# Patient Record
Sex: Female | Born: 1940
Health system: Southern US, Community
[De-identification: ages and names within clinical notes are randomized; demographics above are authoritative.]

## PROBLEM LIST (undated history)

## (undated) DIAGNOSIS — E7800 Pure hypercholesterolemia, unspecified: Secondary | ICD-10-CM

## (undated) DIAGNOSIS — F32A Depression, unspecified: Secondary | ICD-10-CM

## (undated) DIAGNOSIS — F329 Major depressive disorder, single episode, unspecified: Secondary | ICD-10-CM

## (undated) DIAGNOSIS — K219 Gastro-esophageal reflux disease without esophagitis: Secondary | ICD-10-CM

## (undated) DIAGNOSIS — I10 Essential (primary) hypertension: Secondary | ICD-10-CM

## (undated) DIAGNOSIS — F419 Anxiety disorder, unspecified: Secondary | ICD-10-CM

## (undated) HISTORY — PX: TONSILLECTOMY: SUR1361

## (undated) HISTORY — PX: HEMORRHOID SURGERY: SHX153

## (undated) HISTORY — DX: Anxiety disorder, unspecified: F41.9

## (undated) HISTORY — DX: Depression, unspecified: F32.A

## (undated) HISTORY — PX: BREAST REDUCTION SURGERY: SHX8

## (undated) HISTORY — PX: STOMACH SURGERY: SHX791

## (undated) HISTORY — DX: Major depressive disorder, single episode, unspecified: F32.9

## (undated) HISTORY — PX: OTHER SURGICAL HISTORY: SHX169

## (undated) HISTORY — DX: Gastro-esophageal reflux disease without esophagitis: K21.9

---

## 2001-11-28 ENCOUNTER — Ambulatory Visit (HOSPITAL_COMMUNITY): Admission: RE | Admit: 2001-11-28 | Discharge: 2001-11-28 | Payer: Self-pay | Admitting: Pulmonary Disease

## 2002-07-15 ENCOUNTER — Ambulatory Visit (HOSPITAL_COMMUNITY): Admission: RE | Admit: 2002-07-15 | Discharge: 2002-07-15 | Payer: Self-pay | Admitting: Pulmonary Disease

## 2003-07-21 ENCOUNTER — Emergency Department (HOSPITAL_COMMUNITY): Admission: EM | Admit: 2003-07-21 | Discharge: 2003-07-21 | Payer: Self-pay | Admitting: Emergency Medicine

## 2005-01-18 ENCOUNTER — Ambulatory Visit (HOSPITAL_COMMUNITY): Admission: RE | Admit: 2005-01-18 | Discharge: 2005-01-18 | Payer: Self-pay | Admitting: Pulmonary Disease

## 2005-07-21 ENCOUNTER — Ambulatory Visit (HOSPITAL_COMMUNITY): Admission: RE | Admit: 2005-07-21 | Discharge: 2005-07-21 | Payer: Self-pay | Admitting: Pulmonary Disease

## 2005-07-24 ENCOUNTER — Ambulatory Visit (HOSPITAL_COMMUNITY): Admission: RE | Admit: 2005-07-24 | Discharge: 2005-07-24 | Payer: Self-pay | Admitting: Pulmonary Disease

## 2005-08-20 HISTORY — PX: COLONOSCOPY: SHX174

## 2005-08-31 ENCOUNTER — Ambulatory Visit: Payer: Self-pay | Admitting: Internal Medicine

## 2005-09-08 ENCOUNTER — Ambulatory Visit (HOSPITAL_COMMUNITY): Admission: RE | Admit: 2005-09-08 | Discharge: 2005-09-08 | Payer: Self-pay | Admitting: Internal Medicine

## 2005-09-08 ENCOUNTER — Encounter (INDEPENDENT_AMBULATORY_CARE_PROVIDER_SITE_OTHER): Payer: Self-pay | Admitting: Specialist

## 2005-09-08 ENCOUNTER — Ambulatory Visit: Payer: Self-pay | Admitting: Internal Medicine

## 2005-10-17 ENCOUNTER — Ambulatory Visit (HOSPITAL_COMMUNITY): Admission: RE | Admit: 2005-10-17 | Discharge: 2005-10-17 | Payer: Self-pay | Admitting: Pulmonary Disease

## 2006-07-27 ENCOUNTER — Ambulatory Visit (HOSPITAL_COMMUNITY): Admission: RE | Admit: 2006-07-27 | Discharge: 2006-07-27 | Payer: Self-pay | Admitting: Pulmonary Disease

## 2006-10-08 ENCOUNTER — Ambulatory Visit (HOSPITAL_COMMUNITY): Admission: RE | Admit: 2006-10-08 | Discharge: 2006-10-08 | Payer: Self-pay | Admitting: Pulmonary Disease

## 2007-08-12 ENCOUNTER — Ambulatory Visit (HOSPITAL_COMMUNITY): Admission: RE | Admit: 2007-08-12 | Discharge: 2007-08-12 | Payer: Self-pay | Admitting: Pulmonary Disease

## 2007-10-11 ENCOUNTER — Ambulatory Visit (HOSPITAL_COMMUNITY): Admission: RE | Admit: 2007-10-11 | Discharge: 2007-10-11 | Payer: Self-pay | Admitting: Pulmonary Disease

## 2008-08-21 ENCOUNTER — Ambulatory Visit (HOSPITAL_COMMUNITY): Admission: RE | Admit: 2008-08-21 | Discharge: 2008-08-21 | Payer: Self-pay | Admitting: Pulmonary Disease

## 2009-03-22 ENCOUNTER — Ambulatory Visit (HOSPITAL_COMMUNITY): Admission: RE | Admit: 2009-03-22 | Discharge: 2009-03-22 | Payer: Self-pay | Admitting: Pulmonary Disease

## 2009-04-20 ENCOUNTER — Ambulatory Visit (HOSPITAL_COMMUNITY): Admission: RE | Admit: 2009-04-20 | Discharge: 2009-04-20 | Payer: Self-pay | Admitting: Pulmonary Disease

## 2010-01-10 ENCOUNTER — Ambulatory Visit (HOSPITAL_COMMUNITY): Admission: RE | Admit: 2010-01-10 | Discharge: 2010-01-10 | Payer: Self-pay | Admitting: Pulmonary Disease

## 2010-01-17 ENCOUNTER — Ambulatory Visit (HOSPITAL_COMMUNITY): Admission: RE | Admit: 2010-01-17 | Discharge: 2010-01-17 | Payer: Self-pay | Admitting: Pulmonary Disease

## 2010-03-13 ENCOUNTER — Encounter (INDEPENDENT_AMBULATORY_CARE_PROVIDER_SITE_OTHER): Payer: Self-pay | Admitting: Internal Medicine

## 2010-07-08 NOTE — Op Note (Signed)
NAME:  [name] [name]                ACCOUNT NO.:  [id number]   MEDICAL RECORD NO.:  [id number]          PATIENT TYPE:  AMB   LOCATION:  DAY                           FACILITY:  [facility]   PHYSICIAN:  Lionel December, M.D.    DATE OF BIRTH:  1940/05/03   DATE OF PROCEDURE:  09/08/2005  DATE OF DISCHARGE:                                 OPERATIVE REPORT   PROCEDURE:  Colonoscopy.   ENDOSCOPIST:  Lionel December, M.D.   INDICATIONS:  Patient is a 70 year old Caucasian female with  chronic/intermittent hematochezia.  She recently had a CT which showed  thickening to the sigmoid colon. The study also showed 2 small hepatic  lesions; 1 in the left lobe, and another 1 in the right lobe.  I have  reviewed the imaging studies and these lesions are stable compared to prior  study of May 2005; and, therefore, do not need to be followed.  The  procedure and risks were reviewed with the patient and informed consent was  obtained.   MEDICATIONS FOR CONSCIOUS SEDATION:  Demerol 50 mg IV and Versed 8 mg IV.   FINDINGS:  Procedure performed in endoscopy suite.  The patient's vital  signs and O2 saturation were monitored during the procedure and remained  stable.  The patient was placed in the left lateral recumbent position and  rectal examination performed.  No abnormality noted on external or digital  exam.   Olympus videoscope was placed in the rectum and advanced, under vision, into  the sigmoid colon and beyond.  There were a cluster of a few small polyps in  the distal sigmoid colon which were dealt with on the way out.  The  preparation was satisfactory.  The scope was advanced to the cecum which was  identified by ileocecal valve and appendiceal orifice.  Pictures taken for  the record.  As the scope was withdrawn, colonic mucosa was carefully  examined.  There was a single diverticulum at the sigmoid colon. There was a  cluster of 4 or 5 polyps in the distal sigmoid colon.  Two of these had a  hemorrhagic surface.  These were very small.  These are ablated by a cold  biopsy.  Mucosa of the rest of the colon and rectum was normal.  The scope  was retroflexed to examine anorectal junction and small hemorrhoids are  noted below the dentate line.   Endoscope was straightened and withdrawn.  The patient tolerated the  procedure well.   FINAL DIAGNOSES:  1.  Examination performed to cecum.  2.  A single tiny diverticulum at he sigmoid colon.  3.  A cluster of small polyps at the distal sigmoid colon ablated by a cold      biopsy, suspicious for hyperplastic polyps.  4.  External hemorrhoids.   RECOMMENDATIONS:  1.  High fiber diet.  2.  She will continue dicyclomine at 10 mg b.i.d.  3.  I will be contacting the patient with the biopsy results; and further      recommendations.      Lionel December, M.D.  Electronically  Signed     NR/MEDQ  D:  09/08/2005  T:  09/08/2005  Job:  [id number]   cc:   Ramon Dredge L. Juanetta Gosling, M.D.  Fax: [phone]

## 2010-07-08 NOTE — Consult Note (Signed)
NAME:  [name] [name], [name] [name]                ACCOUNT NO.:  [id number]   MEDICAL RECORD NO.:  [id number]           PATIENT TYPE:  AMB   LOCATION:                                FACILITY:  [facility]   PHYSICIAN:  Lionel December, M.D.    DATE OF BIRTH:  14-Jun-1940   DATE OF CONSULTATION:  08/31/2005  DATE OF DISCHARGE:                                   CONSULTATION   REASON FOR CONSULTATION:  Hematochezia and abnormal abdominal CT in  reference to liver and sigmoid colon.   HISTORY OF PRESENT ILLNESS:  [name] [name] is a 70 year old Caucasian female who is  referred through the courtesy of Dr. Juanetta Gosling for GI evaluation.  She gives a  several month history of intermittent hematochezia with her bowel movements.  Sometimes blood just drips.  She has use suppositories which have helped.  She also complains of intermittent pain across her abdomen, associated with  lightheadedness and presyncope.  She recalls that the first episode occurred  in [place] when she actually passed out.  She remembers that she became  lightheaded, nauseated, cold and clammy before this happened, but she has  never had syncope since then, although she has had some close calls.  She  says that she has had fairly extensive workup when she was living in [place], but it has all been negative.  She denies melena or frank  bleeding.  She states her bowels move three to four times a day, usually in  the morning; and most of her stools are soft.  She denies nausea or  vomiting.  When she was working.  She was not eating the right foods, and  she would get heartburn frequently; but now that she has changed her eating  habits, she rarely experiences it.  She denies dysphagia.  She is  complaining of pain in her right flank.  That was the reason that she had a  CT which was negative for kidney stones.  She had 2 small lesions, and the  liver felt to be consistent with hemangiomas.  One measured 10 x 10 mm in  the lateral segment of left lobe and  the other one measured 10 x 8 mm in the  right lobe.  These were also seen preceding exam of 2005.  Dr. Tyron Russell who  read this study felt that these were stable.  She also had incompletely  distended sigmoid colon with somewhat prominent wall; and the question of  slight pericolic infiltrative changes with increased vascularity to the  sigmoid mesocolon.  There was no fluid collection noted.   The patient also had blood work by Dr. Juanetta Gosling on 07/20/2005.  Her WBC 6.8,  H&H 13.2 and 40.6, platelet count 274,000.  Comprehensive chemistry panel  was normal, bilirubin 0.5, AP 59, AST 21, ALT 13, albumin 4.2.  TSH was  1.007.   MEDICATIONS:  1.  She is on Actonel 35 mg every week.  2.  Restoril 30 mg q.h.s. p.r.n.  3.  Lasix 40 mg daily.  4.  Ativan 1 mg b.i.d.  5.  Atenolol 50 mg daily.  6.  Fish oil capsule one daily.  7.  B12 one daily.  8.  B complex one daily.  9.  Calcium and vitamin D 600 mg b.i.d.  10. Sinemet 2 capsules daily.  11. Gastric capsules daily.  12. Garlic capsules 2 daily.   PAST MEDICAL HISTORY:  She has been hypertensive for several years.  She has  had problems with anxiety.  She has GERD and knee arthritis.  She was  diagnosed with osteoporosis this year.   PRIOR SURGERIES INCLUDE:  1.  Hemorrhoidectomy in 1969.  2.  She had tumor removed from her stomach in 1976 when she was living in      [place].  All she knows is that it was a benign tumor.  3.  She had submandibular glands or lymph nodes removed when she was a      child.  Apparently she had an infection in this area.  4.  She had breast reduction in 1993.  5.  She had a C-section in 1975.   ALLERGIES:  To CODEINE causing syncope.   FAMILY HISTORY:  Noncontributory.  She believes father died of TB and her  mother had heart disease and died in her 30s.  She had one sister and 44  brothers all of them are deceased except one brother.  Most of the brothers  died during the [place] War.   SOCIAL  HISTORY:  She is married (second marriage).  She has two children.  Her son has gout and obesity.  She worked at [employer] for 12  years.  She is originally from [place].  She came in 1967 and went back for a  few years, but now here permanently.  She has never smoked cigarettes and  does not drink alcohol.   PHYSICAL EXAM:  GENERAL:  A pleasant well-developed, well-nourished  Caucasian female who is in no acute distress.  VITAL SIGNS:  She weighs 153 pounds.  She is 4 feet 11 inches tall.  Pulse  64, blood pressure 128/80, temperature is 98.  HEENT:  Conjunctivae is pink.  Sclerae is nonicteric.  Oral pharyngeal  mucosa is normal.  She has a few teeth missing in the lower jaw.  Remaining  dentition in satisfactory condition.  NECK:  No neck masses or thyromegaly noted.  She has a big, deep scar  inferior to the right submandibular angle.  She has a deep scar in right  submandibular area but no adenopathy noted.  CARDIAC EXAM:  Regular rhythm.  Normal S1-S2.  No murmur or gallop noted.  LUNGS:  Clear to auscultation.  ABDOMEN:  Symmetrical bowel sounds are normal; on palpation it is soft and  nontender.  No organomegaly or masses noted.  RECTAL EXAMINATION:  Deferred.  She does not have peripheral edema or clubbing.   LAB DATA:  As above.  Recent CT findings as above.  Please note CT was done  on 07/21/2005.   ASSESSMENT:  1.  Chronic hematochezia.  I suspect this is secondary to hemorrhoids, but      need to be sure that he not dealing with colonic neoplasm, polyps,      etcetera.  She is average risk for colorectal carcinoma.  2.  Intermittent abdominal pain associated with lightheadedness/presyncope.      I wonder if she has not IBS.  Apparently she has had this syndrome for a      long time.  3.  Two  small hepatic lesions initially seen on a CT of 2005 and the CT of     last month suggests that these are stable.  Films will be reviewed.   RECOMMENDATIONS:  1.   Dicyclomine 10 mg b.i.d.  Prescription given for 60 with 5 refills.  2.  Colonoscopy both for diagnostic and screening purposes.  I have reviewed      the procedures and risks with the patient; she is agreeable.  3.  As far as her flank pain is concerned, I have asked her to followup with      Dr. Juanetta Gosling.  I feel that this is musculoskeletal pain.   We would like to thank Dr. Juanetta Gosling for the opportunity to participate in the  care of this nice lady.      Lionel December, M.D.  Electronically Signed     NR/MEDQ  D:  08/31/2005  T:  08/31/2005  Job:  [id number]   cc:   Ramon Dredge L. Juanetta Gosling, M.D.  Fax: [phone]   [facility]  Fax: [phone]

## 2011-01-18 ENCOUNTER — Other Ambulatory Visit (HOSPITAL_COMMUNITY): Payer: Self-pay | Admitting: Pulmonary Disease

## 2011-01-18 DIAGNOSIS — Z139 Encounter for screening, unspecified: Secondary | ICD-10-CM

## 2011-01-23 ENCOUNTER — Ambulatory Visit (HOSPITAL_COMMUNITY): Payer: Self-pay

## 2011-01-27 ENCOUNTER — Ambulatory Visit (HOSPITAL_COMMUNITY): Admission: RE | Admit: 2011-01-27 | Payer: PRIVATE HEALTH INSURANCE | Source: Ambulatory Visit

## 2011-01-27 ENCOUNTER — Ambulatory Visit (HOSPITAL_COMMUNITY)
Admission: RE | Admit: 2011-01-27 | Discharge: 2011-01-27 | Disposition: A | Discharge status: Home / Self Care | Payer: PRIVATE HEALTH INSURANCE | Source: Ambulatory Visit | Attending: Pulmonary Disease | Admitting: Pulmonary Disease

## 2011-01-27 ENCOUNTER — Ambulatory Visit (HOSPITAL_COMMUNITY): Payer: PRIVATE HEALTH INSURANCE

## 2011-01-27 ENCOUNTER — Other Ambulatory Visit (HOSPITAL_COMMUNITY): Payer: Self-pay | Admitting: Pulmonary Disease

## 2011-01-27 DIAGNOSIS — Z139 Encounter for screening, unspecified: Secondary | ICD-10-CM

## 2011-01-27 DIAGNOSIS — Z1231 Encounter for screening mammogram for malignant neoplasm of breast: Secondary | ICD-10-CM | POA: Insufficient documentation

## 2011-01-30 ENCOUNTER — Ambulatory Visit (HOSPITAL_COMMUNITY): Payer: PRIVATE HEALTH INSURANCE

## 2012-03-05 ENCOUNTER — Encounter (HOSPITAL_COMMUNITY): Payer: Self-pay | Admitting: *Deleted

## 2012-03-05 ENCOUNTER — Emergency Department (HOSPITAL_COMMUNITY): Payer: PRIVATE HEALTH INSURANCE

## 2012-03-05 ENCOUNTER — Emergency Department (HOSPITAL_COMMUNITY)
Admission: EM | Admit: 2012-03-05 | Discharge: 2012-03-05 | Disposition: A | Discharge status: Home / Self Care | Payer: PRIVATE HEALTH INSURANCE | Attending: Emergency Medicine | Admitting: Emergency Medicine

## 2012-03-05 DIAGNOSIS — W010XXA Fall on same level from slipping, tripping and stumbling without subsequent striking against object, initial encounter: Secondary | ICD-10-CM | POA: Insufficient documentation

## 2012-03-05 DIAGNOSIS — E7800 Pure hypercholesterolemia, unspecified: Secondary | ICD-10-CM | POA: Insufficient documentation

## 2012-03-05 DIAGNOSIS — I10 Essential (primary) hypertension: Secondary | ICD-10-CM | POA: Insufficient documentation

## 2012-03-05 DIAGNOSIS — W19XXXA Unspecified fall, initial encounter: Secondary | ICD-10-CM

## 2012-03-05 DIAGNOSIS — Y9301 Activity, walking, marching and hiking: Secondary | ICD-10-CM | POA: Insufficient documentation

## 2012-03-05 DIAGNOSIS — Y929 Unspecified place or not applicable: Secondary | ICD-10-CM | POA: Insufficient documentation

## 2012-03-05 DIAGNOSIS — S8000XA Contusion of unspecified knee, initial encounter: Secondary | ICD-10-CM | POA: Insufficient documentation

## 2012-03-05 HISTORY — DX: Essential (primary) hypertension: I10

## 2012-03-05 HISTORY — DX: Pure hypercholesterolemia, unspecified: E78.00

## 2012-03-05 MED ORDER — OXYCODONE-ACETAMINOPHEN 5-325 MG PO TABS: 1.0000 | ORAL_TABLET | Freq: Four times a day (QID) | ORAL | Status: DC | PRN

## 2012-03-05 NOTE — ED Provider Notes (Signed)
History     CSN: [id number]  Arrival date & time 03/05/12  1739   First MD Initiated Contact with Patient 03/05/12 1829      Chief Complaint  Patient presents with  . Knee Pain    (Consider location/radiation/quality/duration/timing/severity/associated sxs/prior treatment) HPI Comments: Patient was walking yesterday and tripped.  She landed on both knees with have been hurting since that time.  No prior knee surgery or serious injury.  Patient is a 72 y.o. female presenting with knee pain. The history is provided by the patient.  Knee Pain This is a new problem. The current episode started yesterday. The problem occurs constantly. The problem has not changed since onset.The symptoms are aggravated by walking. Nothing relieves the symptoms.    Past Medical History  Diagnosis Date  . Hypertension   . Hypercholesteremia     Past Surgical History  Procedure Date  . Breast reduction surgery   . Cesarean section   . Hemorrhoid surgery   . Tonsillectomy     History reviewed. No pertinent family history.  History  Substance Use Topics  . Smoking status: Never Smoker   . Smokeless tobacco: Not on file  . Alcohol Use: No    OB History    Grav Para Term Preterm Abortions TAB SAB Ect Mult Living                  Review of Systems  All other systems reviewed and are negative.    Allergies  Other  Home Medications  No current outpatient prescriptions on file.  BP 150/87  Pulse 68  Temp 97.7 F (36.5 C) (Oral)  Resp 18  SpO2 98%  Physical Exam  Nursing note and vitals reviewed. Constitutional: She is oriented to person, place, and time. She appears well-developed and well-nourished.  HENT:  Head: Normocephalic and atraumatic.  Mouth/Throat: Oropharynx is clear and moist.  Neck: Normal range of motion. Neck supple.  Musculoskeletal:       The bilateral knees are without deformity or abrasions.  They are both stable ap and lateral.  There is no crepitus.   Good range of motion with limited pain.  Neurological: She is alert and oriented to person, place, and time.  Skin: Skin is warm and dry.    ED Course  Procedures (including critical care time)  Labs Reviewed - No data to display No results found.   No diagnosis found.    MDM  The xrays show no fracture or other abnormality.  She will be discharged with pain meds, return prn.        Geoffery Lyons, MD 03/05/12 [phone]

## 2012-03-05 NOTE — ED Notes (Signed)
Pain bil knees, fell yesterday,  Ambulatory into triage.

## 2012-04-28 ENCOUNTER — Emergency Department (HOSPITAL_COMMUNITY): Payer: PRIVATE HEALTH INSURANCE

## 2012-04-28 ENCOUNTER — Emergency Department (HOSPITAL_COMMUNITY)
Admission: EM | Admit: 2012-04-28 | Discharge: 2012-04-29 | Disposition: A | Discharge status: Home / Self Care | Payer: PRIVATE HEALTH INSURANCE | Attending: Emergency Medicine | Admitting: Emergency Medicine

## 2012-04-28 ENCOUNTER — Encounter (HOSPITAL_COMMUNITY): Payer: Self-pay | Admitting: *Deleted

## 2012-04-28 DIAGNOSIS — W010XXA Fall on same level from slipping, tripping and stumbling without subsequent striking against object, initial encounter: Secondary | ICD-10-CM | POA: Insufficient documentation

## 2012-04-28 DIAGNOSIS — E7800 Pure hypercholesterolemia, unspecified: Secondary | ICD-10-CM | POA: Insufficient documentation

## 2012-04-28 DIAGNOSIS — Y9301 Activity, walking, marching and hiking: Secondary | ICD-10-CM | POA: Insufficient documentation

## 2012-04-28 DIAGNOSIS — S32591A Other specified fracture of right pubis, initial encounter for closed fracture: Secondary | ICD-10-CM

## 2012-04-28 DIAGNOSIS — S32509A Unspecified fracture of unspecified pubis, initial encounter for closed fracture: Secondary | ICD-10-CM | POA: Insufficient documentation

## 2012-04-28 DIAGNOSIS — Y9289 Other specified places as the place of occurrence of the external cause: Secondary | ICD-10-CM | POA: Insufficient documentation

## 2012-04-28 DIAGNOSIS — W19XXXA Unspecified fall, initial encounter: Secondary | ICD-10-CM

## 2012-04-28 DIAGNOSIS — R51 Headache: Secondary | ICD-10-CM | POA: Insufficient documentation

## 2012-04-28 DIAGNOSIS — Z79899 Other long term (current) drug therapy: Secondary | ICD-10-CM | POA: Insufficient documentation

## 2012-04-28 DIAGNOSIS — I10 Essential (primary) hypertension: Secondary | ICD-10-CM | POA: Insufficient documentation

## 2012-04-28 MED ORDER — OXYCODONE HCL 5 MG PO TABS
5.0000 mg | ORAL_TABLET | Freq: Once | ORAL | Status: AC
  Administered 2012-04-28: 5 mg via ORAL
  Filled 2012-04-28: qty 1

## 2012-04-28 MED ORDER — HYDROCODONE-ACETAMINOPHEN 5-325 MG PO TABS
2.0000 | ORAL_TABLET | Freq: Once | ORAL | Status: AC
  Administered 2012-04-28: 2 via ORAL
  Filled 2012-04-28: qty 2

## 2012-04-28 MED ORDER — OXYCODONE-ACETAMINOPHEN 5-325 MG PO TABS: 2.0000 | ORAL_TABLET | ORAL | Status: DC | PRN

## 2012-04-28 NOTE — ED Notes (Signed)
Pt given diet coke. No n/v or other complaints.

## 2012-04-28 NOTE — ED Notes (Addendum)
Bedside report received from previous RN. Pt cleared from LSB & HB. No c-collar in place. EMS stabilized neck using a secured towel. C-collar placed. No crepitus or pain noted along spine or neck. Pt c/o pain in rt thigh near the groin.

## 2012-04-28 NOTE — ED Notes (Signed)
Bed:WHALA<BR> Expected date:<BR> Expected time:<BR> Means of arrival:<BR> Comments:<BR> Fall/back pain

## 2012-04-28 NOTE — ED Provider Notes (Signed)
History     CSN: [id number]  Arrival date & time 04/28/12  4098   First MD Initiated Contact with Patient 04/28/12 2005      Chief Complaint  Patient presents with  . Fall  . Back Pain    (Consider location/radiation/quality/duration/timing/severity/associated sxs/prior treatment) HPI Comments: Patient presents with head and back pain after mechanical fall. She was walking to her apartment building tripped and fell backwards hitting the back of her head. Did not lose consciousness. Complains of pain in her head as well as her low back and right groin. Denies any weakness, numbness or tingling. Denies any chest pain, shortness of breath, abdominal pain, nausea or vomiting. She is not taking anticoagulation.  The history is provided by the patient and the spouse.    Past Medical History  Diagnosis Date  . Hypertension   . Hypercholesteremia     Past Surgical History  Procedure Laterality Date  . Breast reduction surgery    . Cesarean section    . Hemorrhoid surgery    . Tonsillectomy      No family history on file.  History  Substance Use Topics  . Smoking status: Never Smoker   . Smokeless tobacco: Not on file  . Alcohol Use: No    OB History   Grav Para Term Preterm Abortions TAB SAB Ect Mult Living                  Review of Systems  Constitutional: Negative for fever, activity change and appetite change.  HENT: Negative for congestion and neck pain.   Respiratory: Negative for cough, chest tightness and shortness of breath.   Cardiovascular: Negative for chest pain.  Gastrointestinal: Negative for nausea, vomiting and abdominal pain.  Genitourinary: Negative for dysuria and hematuria.  Musculoskeletal: Positive for back pain.  Skin: Negative for rash.  Neurological: Positive for headaches. Negative for dizziness, syncope and weakness.  A complete 10 system review of systems was obtained and all systems are negative except as noted in the HPI and PMH.     Allergies  Morphine and related and Other  Home Medications   Current Outpatient Rx  Name  Route  Sig  Dispense  Refill  . atenolol (TENORMIN) 50 MG tablet   Oral   Take 50 mg by mouth daily.         . cholecalciferol (VITAMIN D) 1000 UNITS tablet   Oral   Take 1,000 Units by mouth daily.         . Cyanocobalamin (VITAMIN B 12 PO)   Oral   Take 1 tablet by mouth daily.         Marland Kitchen esomeprazole (NEXIUM) 20 MG capsule   Oral   Take 20 mg by mouth daily before breakfast.         . HYDROcodone-acetaminophen (NORCO/VICODIN) 5-325 MG per tablet   Oral   Take 1 tablet by mouth every 6 (six) hours as needed. For knee pain         . LORazepam (ATIVAN) 1 MG tablet   Oral   Take 1 mg by mouth every 8 (eight) hours. scheduled         . Multiple Vitamin (MULTIVITAMIN WITH MINERALS) TABS   Oral   Take 1 tablet by mouth daily.         Marland Kitchen omega-3 acid ethyl esters (LOVAZA) 1 G capsule   Oral   Take 1 g by mouth daily.         Marland Kitchen  vitamin E (VITAMIN E) 400 UNIT capsule   Oral   Take 400 Units by mouth daily.         Marland Kitchen zolpidem (AMBIEN) 5 MG tablet   Oral   Take 10 mg by mouth at bedtime as needed for sleep.         Marland Kitchen oxyCODONE-acetaminophen (PERCOCET/ROXICET) 5-325 MG per tablet   Oral   Take 2 tablets by mouth every 4 (four) hours as needed for pain.   15 tablet   0     BP 136/65  Pulse 66  Temp(Src) 97.4 F (36.3 C)  Resp 18  SpO2 97%  Physical Exam  Constitutional: She is oriented to person, place, and time. She appears well-developed and well-nourished. No distress.  HENT:  Head: Normocephalic.  Mouth/Throat: Oropharynx is clear and moist. No oropharyngeal exudate.  Eyes: Conjunctivae and EOM are normal. Pupils are equal, round, and reactive to light.  Neck: Normal range of motion. Neck supple.  No C-spine pain, step-off or deformity  Cardiovascular: Normal rate, regular rhythm and normal heart sounds.   No murmur heard. Pulmonary/Chest:  Effort normal and breath sounds normal. No respiratory distress.  Abdominal: Soft. There is no tenderness. There is no rebound and no guarding.  Musculoskeletal: Normal range of motion. She exhibits tenderness.  TTP R paraspinal lumbar muscles.  TTP R groin.  +2 DP and PT pulses.  Pain with logrolling of R leg.  Neurological: She is alert and oriented to person, place, and time. No cranial nerve deficit. She exhibits normal muscle tone. Coordination normal.    ED Course  Procedures (including critical care time)  Labs Reviewed - No data to display Dg Hip Complete Right  04/28/2012  *RADIOLOGY REPORT*  Clinical Data: Fall.  Right hip pain.  RIGHT HIP - COMPLETE 2+ VIEW  Comparison: None.  Findings: Both hips are located.  No hip fracture is identified. Subtle cortical irregularity of the right inferior pubic ramus is seen which is not identified left and may be due to a nondisplaced fracture.  No other evidence of fracture is identified.  IMPRESSION: Possible nondisplaced right inferior pubic ramus fracture.  No evidence of fracture.   Original Report Authenticated By: Holley Dexter, M.D.    Dg Femur Right  04/28/2012  *RADIOLOGY REPORT*  Clinical Data: Fall, pain.  RIGHT FEMUR - 2 VIEW  Comparison: None.  Findings: No acute bony or joint abnormality is identified.  There is advanced degenerative disease about the right knee, worst in the medial compartment.  IMPRESSION:  1.  No acute finding. 2.  Osteoarthritis appearing severe about the medial compartment.   Original Report Authenticated By: Holley Dexter, M.D.    Ct Head Wo Contrast  04/28/2012  *RADIOLOGY REPORT*  Clinical Data:  Fall striking posterior head, neck pain  CT HEAD WITHOUT CONTRAST CT CERVICAL SPINE WITHOUT CONTRAST  Technique:  Multidetector CT imaging of the head and cervical spine was performed following the standard protocol without intravenous contrast.  Multiplanar CT image reconstructions of the cervical spine were also  generated.  Comparison:  01/18/2005  CT HEAD  Findings: Normal ventricular morphology. No midline shift or mass effect. Normal appearance brain parenchyma. No intracranial hemorrhage, mass lesion, or evidence of acute infarction. No extra-axial fluid collections. Bones appear demineralized but intact. Visualized paranasal sinuses and mastoid air cells clear.  IMPRESSION: No acute intracranial abnormalities.  CT CERVICAL SPINE  Findings: Prevertebral soft tissues normal thickness. Bones diffusely demineralized. Multilevel disc space narrowing C3-C4 through C6-C7.  Vertebral body heights maintained without fracture or subluxation. Minimal scattered uncovertebral and endplate spurs. Multilevel facet degenerative changes bilaterally. Visualized skull base intact. Lung apices clear.  IMPRESSION: Multilevel degenerative disc and facet disease changes of the cervical spine. No definite acute bony abnormalities.   Original Report Authenticated By: Ulyses Southward, M.D.    Ct Cervical Spine Wo Contrast  04/28/2012  *RADIOLOGY REPORT*  Clinical Data:  Fall striking posterior head, neck pain  CT HEAD WITHOUT CONTRAST CT CERVICAL SPINE WITHOUT CONTRAST  Technique:  Multidetector CT imaging of the head and cervical spine was performed following the standard protocol without intravenous contrast.  Multiplanar CT image reconstructions of the cervical spine were also generated.  Comparison:  01/18/2005  CT HEAD  Findings: Normal ventricular morphology. No midline shift or mass effect. Normal appearance brain parenchyma. No intracranial hemorrhage, mass lesion, or evidence of acute infarction. No extra-axial fluid collections. Bones appear demineralized but intact. Visualized paranasal sinuses and mastoid air cells clear.  IMPRESSION: No acute intracranial abnormalities.  CT CERVICAL SPINE  Findings: Prevertebral soft tissues normal thickness. Bones diffusely demineralized. Multilevel disc space narrowing C3-C4 through C6-C7. Vertebral  body heights maintained without fracture or subluxation. Minimal scattered uncovertebral and endplate spurs. Multilevel facet degenerative changes bilaterally. Visualized skull base intact. Lung apices clear.  IMPRESSION: Multilevel degenerative disc and facet disease changes of the cervical spine. No definite acute bony abnormalities.   Original Report Authenticated By: Ulyses Southward, M.D.    Ct Pelvis Wo Contrast  04/28/2012  *RADIOLOGY REPORT*  Clinical Data: Fall.  Right pubic pain.  CT PELVIS WITHOUT CONTRAST  Technique:  Multidetector CT imaging of the pelvis was performed following the standard protocol without intravenous contrast.  Comparison: CT abdomen and pelvis 07/21/2005.  Plain films earlier this same date.  Findings: Both hips are located.  No hip fracture is identified. Nondisplaced right inferior pubic ramus fracture is identified. The fracture is new since the 2007 examination but age indeterminate. Favor remote injury with partial cortication of the fracture lines seen.  No hip joint effusion is noted.  Intrapelvic contents demonstrate no acute abnormality.  IMPRESSION:  1.  Nondisplaced right inferior pubic ramus fracture is new since 2007 but age indeterminate.  Favor subacute to remote injury. 2.  Negative for hip fracture.   Original Report Authenticated By: Holley Dexter, M.D.      1. Pubic ramus fracture, right, closed, initial encounter   2. Fall, initial encounter       MDM  Mechanical fall with head and back pain. No loss of consciousness. No syncope, dizziness, chest pain or shortness of breath.  Right pubic ramus fracture on imaging that does not appear acute however it is this patient's site of pain. Discuss with Dr. Jillyn Hidden. He agrees with weightbearing as tolerated.  Patient's pain is well controlled in the ED and she is able to ambulate.  She and her family feel comfortable taking her home   Glynn Octave, MD 04/29/12 [phone]

## 2012-04-28 NOTE — ED Notes (Signed)
Per ems: pt from home, was ambulating into apartment building, lost balanace and fell backwards hitting back of head. Fall witnessed by family members. Denies LOC, neck pain, hematoma/ any bleeding. Pupils equal/reactive. Pain 6/10 on upper and lower back. Hx of HTN. bp 112/80, pulse 60, respirations 16 even/unlabored

## 2012-09-20 ENCOUNTER — Other Ambulatory Visit (HOSPITAL_COMMUNITY): Payer: Self-pay | Admitting: Pulmonary Disease

## 2012-09-20 ENCOUNTER — Ambulatory Visit (HOSPITAL_COMMUNITY)
Admission: RE | Admit: 2012-09-20 | Discharge: 2012-09-20 | Disposition: A | Discharge status: Home / Self Care | Payer: PRIVATE HEALTH INSURANCE | Source: Ambulatory Visit | Attending: Pulmonary Disease | Admitting: Pulmonary Disease

## 2012-09-20 DIAGNOSIS — M51379 Other intervertebral disc degeneration, lumbosacral region without mention of lumbar back pain or lower extremity pain: Secondary | ICD-10-CM | POA: Insufficient documentation

## 2012-09-20 DIAGNOSIS — M545 Low back pain, unspecified: Secondary | ICD-10-CM | POA: Insufficient documentation

## 2012-09-20 DIAGNOSIS — M5137 Other intervertebral disc degeneration, lumbosacral region: Secondary | ICD-10-CM | POA: Insufficient documentation

## 2012-09-20 DIAGNOSIS — Z139 Encounter for screening, unspecified: Secondary | ICD-10-CM

## 2012-09-27 ENCOUNTER — Other Ambulatory Visit (HOSPITAL_COMMUNITY): Payer: Self-pay | Admitting: Pulmonary Disease

## 2012-09-27 DIAGNOSIS — M545 Low back pain: Secondary | ICD-10-CM

## 2012-09-30 ENCOUNTER — Ambulatory Visit (HOSPITAL_COMMUNITY): Payer: PRIVATE HEALTH INSURANCE

## 2012-10-01 ENCOUNTER — Ambulatory Visit (HOSPITAL_COMMUNITY)
Admission: RE | Admit: 2012-10-01 | Discharge: 2012-10-01 | Disposition: A | Discharge status: Home / Self Care | Payer: PRIVATE HEALTH INSURANCE | Source: Ambulatory Visit | Attending: Pulmonary Disease | Admitting: Pulmonary Disease

## 2012-10-01 DIAGNOSIS — M51379 Other intervertebral disc degeneration, lumbosacral region without mention of lumbar back pain or lower extremity pain: Secondary | ICD-10-CM | POA: Insufficient documentation

## 2012-10-01 DIAGNOSIS — M5380 Other specified dorsopathies, site unspecified: Secondary | ICD-10-CM | POA: Insufficient documentation

## 2012-10-01 DIAGNOSIS — M545 Low back pain, unspecified: Secondary | ICD-10-CM | POA: Insufficient documentation

## 2012-10-01 DIAGNOSIS — M5137 Other intervertebral disc degeneration, lumbosacral region: Secondary | ICD-10-CM | POA: Insufficient documentation

## 2012-10-01 DIAGNOSIS — Z139 Encounter for screening, unspecified: Secondary | ICD-10-CM

## 2012-12-20 ENCOUNTER — Other Ambulatory Visit (HOSPITAL_COMMUNITY): Payer: Self-pay | Admitting: Pulmonary Disease

## 2012-12-20 DIAGNOSIS — Z780 Asymptomatic menopausal state: Secondary | ICD-10-CM

## 2012-12-26 ENCOUNTER — Ambulatory Visit (HOSPITAL_COMMUNITY)
Admission: RE | Admit: 2012-12-26 | Discharge: 2012-12-26 | Disposition: A | Discharge status: Home / Self Care | Payer: PRIVATE HEALTH INSURANCE | Source: Ambulatory Visit | Attending: Pulmonary Disease | Admitting: Pulmonary Disease

## 2012-12-26 DIAGNOSIS — Z780 Asymptomatic menopausal state: Secondary | ICD-10-CM | POA: Insufficient documentation

## 2012-12-26 DIAGNOSIS — E559 Vitamin D deficiency, unspecified: Secondary | ICD-10-CM | POA: Insufficient documentation

## 2012-12-26 DIAGNOSIS — M810 Age-related osteoporosis without current pathological fracture: Secondary | ICD-10-CM | POA: Insufficient documentation

## 2013-02-25 ENCOUNTER — Other Ambulatory Visit (HOSPITAL_COMMUNITY): Payer: Self-pay | Admitting: Pulmonary Disease

## 2013-02-25 DIAGNOSIS — R110 Nausea: Secondary | ICD-10-CM

## 2013-02-27 ENCOUNTER — Ambulatory Visit (HOSPITAL_COMMUNITY)
Admission: RE | Admit: 2013-02-27 | Discharge: 2013-02-27 | Disposition: A | Discharge status: Home / Self Care | Payer: PRIVATE HEALTH INSURANCE | Source: Ambulatory Visit | Attending: Pulmonary Disease | Admitting: Pulmonary Disease

## 2013-02-27 DIAGNOSIS — R110 Nausea: Secondary | ICD-10-CM | POA: Insufficient documentation

## 2013-02-27 DIAGNOSIS — K8020 Calculus of gallbladder without cholecystitis without obstruction: Secondary | ICD-10-CM | POA: Insufficient documentation

## 2013-06-24 ENCOUNTER — Ambulatory Visit (HOSPITAL_COMMUNITY)
Admission: RE | Admit: 2013-06-24 | Discharge: 2013-06-24 | Disposition: A | Discharge status: Home / Self Care | Payer: PRIVATE HEALTH INSURANCE | Source: Ambulatory Visit | Attending: Pulmonary Disease | Admitting: Pulmonary Disease

## 2013-06-24 DIAGNOSIS — R262 Difficulty in walking, not elsewhere classified: Secondary | ICD-10-CM | POA: Insufficient documentation

## 2013-06-24 DIAGNOSIS — Z8781 Personal history of (healed) traumatic fracture: Secondary | ICD-10-CM | POA: Insufficient documentation

## 2013-06-24 DIAGNOSIS — M799 Soft tissue disorder, unspecified: Secondary | ICD-10-CM | POA: Insufficient documentation

## 2013-06-24 DIAGNOSIS — M545 Low back pain, unspecified: Secondary | ICD-10-CM | POA: Insufficient documentation

## 2013-06-24 DIAGNOSIS — R293 Abnormal posture: Secondary | ICD-10-CM

## 2013-06-24 DIAGNOSIS — W19XXXA Unspecified fall, initial encounter: Secondary | ICD-10-CM | POA: Insufficient documentation

## 2013-06-24 DIAGNOSIS — M24569 Contracture, unspecified knee: Secondary | ICD-10-CM | POA: Insufficient documentation

## 2013-06-24 DIAGNOSIS — R29898 Other symptoms and signs involving the musculoskeletal system: Secondary | ICD-10-CM | POA: Insufficient documentation

## 2013-06-24 DIAGNOSIS — G8929 Other chronic pain: Secondary | ICD-10-CM

## 2013-06-24 DIAGNOSIS — M24559 Contracture, unspecified hip: Secondary | ICD-10-CM | POA: Insufficient documentation

## 2013-06-24 DIAGNOSIS — M5416 Radiculopathy, lumbar region: Secondary | ICD-10-CM

## 2013-06-24 NOTE — Evaluation (Signed)
Physical Therapy Evaluation  Patient Details  Name: [name] [name] MRN: [id number] Date of Birth: 08/10/1940  Today's Date: 06/24/2013 Time: 1330-1400 pt 30 minutes late for appointment PT Time Calculation (min): 30 min Charge:  evaluation             Visit#: 1 of 8  Re-eval: 07/24/13 Assessment Diagnosis: back pain Prior Therapy: none  Authorization: [facility]     Authorization Visit#: 1 of 8   Past Medical History:  Past Medical History  Diagnosis Date  . Hypertension   . Hypercholesteremia    Past Surgical History:  Past Surgical History  Procedure Laterality Date  . Breast reduction surgery    . Cesarean section    . Hemorrhoid surgery    . Tonsillectomy      Subjective Symptoms/Limitations Symptoms: [name] [name] has been reffered to therapy for mechanical back pain.  She states that she has been having back pain on the right side of her back into her buttock for about a year after she fell.   Pertinent History: Pt fell backwards on 04/28/2013.   She has fallen in the yard about three times.  MRI shows L4 chronic compression fx as well as retrolisthesis at L2-3  How long can you sit comfortably?: Increased pain after 15  minutes  How long can you stand comfortably?: Pt is able to stand for ten minutes  How long can you walk comfortably?: Pt is able to walk slowly for 10-15 minutes.   Patient Stated Goals: She would like to be able to walk faster and go back to the [facility] Pain Assessment Currently in Pain?: Yes Pain Score: 5  (on percocet; without 9/10) Pain Location: Back Pain Orientation: Right Pain Type: Chronic pain Pain Radiating Towards: to buttock . Pain Relieving Factors: medication Effect of Pain on Daily Activities: increases     Balance Screening Balance Screen Has the patient fallen in the past 6 months: Yes How many times?: 10+ Has the patient had a decrease in activity level because of a fear of falling? : Yes Is the patient reluctant to leave their  home because of a fear of falling? : No  Assessment RLE Strength Right Hip Flexion: 3+/5 Right Hip Extension: 3+/5 Right Hip ABduction: 3+/5 Right Knee Flexion: 3+/5 Right Knee Extension: 3+/5 Right Ankle Dorsiflexion: 3+/5 LLE Strength Left Hip Flexion: 3+/5 Left Hip Extension: 3+/5 Left Hip ABduction: 3+/5 Left Knee Flexion: 3+/5 Left Knee Extension: 3+/5 Left Ankle Dorsiflexion: 3+/5 Lumbar AROM Lumbar Flexion: wnl Lumbar Extension: wnl Lumbar - Right Side Bend: wnl Lumbar - Left Side Bend: wnl Lumbar - Right Rotation: wnl Lumbar - Left Rotation: wnl Palpation Palpation: Riliac crest/ Rt PSIS high even after SI muscle energy techniques.   Exercise/Treatments      Stretches Single Knee to Chest Stretch: 3 reps;30 seconds   Supine Bent Knee Raise: 5 reps Bridge: 5 reps  Manual Therapy Manual Therapy: Other (comment) Other Manual Therapy: MM techniques to correct Rt SI which is anteriorly rotated.   Physical Therapy Assessment and Plan PT Assessment and Plan Clinical Impression Statement: Pt is a 73 yo female who has had chronic radicular back pain.  She has been referred to physical therapy to decrease her pain and improve her functional ability.  Exam demonstrates SI dysfunction: B weakness in LE.  Pt will benefit from LE and core strrengthening , balance activities as well as adjusting her SI for maximal functional potential  Pt will benefit from skilled therapeutic intervention in  order to improve on the following deficits: Decreased activity tolerance;Decreased balance;Pain;Decreased strength;Difficulty walking Rehab Potential: Good PT Frequency: Min 2X/week PT Duration: 4 weeks PT Treatment/Interventions: Functional mobility training;Therapeutic activities;Therapeutic exercise;Manual techniques;Patient/family education;Balance training;Neuromuscular re-education PT Plan: Begin decompression exercises as well as SL abduction and SLS progress through core and LE  strengthening.     Goals Home Exercise Program Pt/caregiver will Perform Home Exercise Program: For increased strengthening PT Goal: Perform Home Exercise Program - Progress: Goal set today PT Short Term Goals Time to Complete Short Term Goals: 2 weeks PT Short Term Goal 1: Pt pain to be no greater than a 7/10 PT Short Term Goal 2: Pt to be able to stand for 15 minutes without increased pain in order to make a small meal PT Short Term Goal 3: Pt to be able to walk for 15 minutes without increased pain for better health habits. PT Long Term Goals Time to Complete Long Term Goals: 4 weeks PT Long Term Goal 1: I in advance HEP PT Long Term Goal 2: Pt to be able to sit for 30 mintues with comfort and then come to standing position without difficulty Long Term Goal 3: Pt to be able to stand for 30 minutes to be able to make a meal Long Term Goal 4: Pt to be walking 30 minutes a day at least 3 x week for better health habits.   Problem List Patient Active Problem List   Diagnosis Date Noted  . Bilateral leg weakness 06/24/2013  . Posture imbalance 06/24/2013  . Chronic radicular low back pain 06/24/2013       GP Functional Assessment Tool Used: clinical judgement Functional Limitation: Mobility: Walking and moving around Mobility: Walking and Moving Around Current Status [phone]): At least 60 percent but less than 80 percent impaired, limited or restricted Mobility: Walking and Moving Around Goal Status [phone]): At least 40 percent but less than 60 percent impaired, limited or restricted  Leeroy Cha 06/24/2013, 2:23 PM  Physician Documentation Your signature is required to indicate approval of the treatment plan as stated above.  Please sign and either send electronically or make a copy of this report for your files and return this physician signed original.   Please mark one 1.__approve of plan  2. ___approve of plan with the following  conditions.   ______________________________                                                          _____________________ Physician Signature                                                                                                             Date

## 2013-07-02 ENCOUNTER — Encounter (INDEPENDENT_AMBULATORY_CARE_PROVIDER_SITE_OTHER): Payer: Self-pay | Admitting: *Deleted

## 2013-07-02 ENCOUNTER — Inpatient Hospital Stay (HOSPITAL_COMMUNITY): Admission: RE | Admit: 2013-07-02 | Payer: PRIVATE HEALTH INSURANCE | Source: Ambulatory Visit

## 2013-07-03 ENCOUNTER — Inpatient Hospital Stay (HOSPITAL_COMMUNITY): Admission: RE | Admit: 2013-07-03 | Payer: PRIVATE HEALTH INSURANCE | Source: Ambulatory Visit | Admitting: *Deleted

## 2013-07-08 ENCOUNTER — Telehealth (HOSPITAL_COMMUNITY): Payer: Self-pay | Admitting: Physical Therapy

## 2013-07-08 ENCOUNTER — Inpatient Hospital Stay (HOSPITAL_COMMUNITY)
Admission: RE | Admit: 2013-07-08 | Discharge: 2013-07-08 | Disposition: A | Discharge status: Home / Self Care | Payer: PRIVATE HEALTH INSURANCE | Source: Ambulatory Visit | Attending: Physical Therapy | Admitting: Physical Therapy

## 2013-07-10 ENCOUNTER — Ambulatory Visit (HOSPITAL_COMMUNITY): Payer: PRIVATE HEALTH INSURANCE | Admitting: Physical Therapy

## 2013-07-11 ENCOUNTER — Encounter (INDEPENDENT_AMBULATORY_CARE_PROVIDER_SITE_OTHER): Payer: Self-pay | Admitting: *Deleted

## 2013-07-15 ENCOUNTER — Ambulatory Visit (HOSPITAL_COMMUNITY): Payer: PRIVATE HEALTH INSURANCE | Admitting: Physical Therapy

## 2013-07-17 ENCOUNTER — Ambulatory Visit (HOSPITAL_COMMUNITY): Payer: PRIVATE HEALTH INSURANCE | Admitting: Physical Therapy

## 2013-08-11 ENCOUNTER — Encounter (INDEPENDENT_AMBULATORY_CARE_PROVIDER_SITE_OTHER): Payer: Self-pay | Admitting: *Deleted

## 2013-08-13 ENCOUNTER — Encounter (INDEPENDENT_AMBULATORY_CARE_PROVIDER_SITE_OTHER): Payer: Self-pay | Admitting: Internal Medicine

## 2013-08-13 ENCOUNTER — Ambulatory Visit (INDEPENDENT_AMBULATORY_CARE_PROVIDER_SITE_OTHER): Payer: PRIVATE HEALTH INSURANCE | Admitting: Internal Medicine

## 2013-08-13 VITALS — BP 90/60 | HR 84 | Temp 98.0°F | Ht 59.0 in | Wt 122.4 lb

## 2013-08-13 DIAGNOSIS — R112 Nausea with vomiting, unspecified: Secondary | ICD-10-CM

## 2013-08-13 DIAGNOSIS — K838 Other specified diseases of biliary tract: Secondary | ICD-10-CM

## 2013-08-13 NOTE — Patient Instructions (Signed)
Hepatic function today. Further recommendations to follow.

## 2013-08-13 NOTE — Progress Notes (Signed)
Subjective:     Patient ID: [name] [name], female   DOB: January 25, 1941, 73 y.o.   MRN: [id number]  HPI Referred to our office by Dr. Luan Pulling for nausea and vomitng. She tells me she has gallstones. After she eats, she sometimes nausea and vomiting.  No foods in particular make her nauseated. She tells me it occurs about 2-3 times a week. Symptoms for about a month. Appetite is not good. She says no foods excite her. She occasionally has acid reflux. The Nexium usually controls her acid reflux. She sometimes she will have epigastric pain and pain in her mid-back.  She has a BM usually daily or every other day. She tells me her stools are white/yellow in color x 3 months.   02/25/2013 US abdomen: IMPRESSION:  CBD 7.44mm Cholelithiasis without evidence of cholecystitis. Mildly dilated  common bile duct is noted ; correlation with liver function tests is  recommended to evaluate for possible distal common bile duct  obstruction.    09/08/2005 Colonoscopy; Dr. Laural Golden: Intermittent hematochezia: FINAL DIAGNOSES:  1. Examination performed to cecum.  2. A single tiny diverticulum at he sigmoid colon.  3. A cluster of small polyps at the distal sigmoid colon ablated by a cold  biopsy, suspicious for hyperplastic polyps.  4. External hemorrhoids.    Review of Systems Past Medical History  Diagnosis Date  . Hypertension   . Hypercholesteremia   . Anxiety   . Depression   . GERD (gastroesophageal reflux disease)     Past Surgical History  Procedure Laterality Date  . Breast reduction surgery    . Cesarean section    . Hemorrhoid surgery    . Tonsillectomy    . Colonoscopy  08/2005  . Excision of submandibular gland    . Stomach surgery      years ago. ? reason    Allergies  Allergen Reactions  . Morphine And Related Other (See Comments)    Patient stated that she thinks that morphine was one of the medication that she had a bad reaction to. She can not remember the other. She said that  she blacked out when she took it.  . Other     Allergic to 2 meds, does not know what they are.    Current Outpatient Prescriptions on File Prior to Visit  Medication Sig Dispense Refill  . alendronate (FOSAMAX) 70 MG tablet Take 70 mg by mouth once a week. Take with a full glass of water on an empty stomach.      Marland Kitchen atenolol (TENORMIN) 50 MG tablet Take 50 mg by mouth daily.      . celecoxib (CELEBREX) 200 MG capsule Take 200 mg by mouth daily.      . Cyanocobalamin (VITAMIN B 12 PO) Take 1 tablet by mouth daily.      Marland Kitchen esomeprazole (NEXIUM) 40 MG capsule Take 40 mg by mouth daily at 12 noon.      . Eszopiclone (ESZOPICLONE) 3 MG TABS Take 3 mg by mouth at bedtime. Take immediately before bedtime      . furosemide (LASIX) 40 MG tablet Take 40 mg by mouth daily.      Marland Kitchen LORazepam (ATIVAN) 1 MG tablet Take 1 mg by mouth 2 (two) times daily. scheduled      . Multiple Vitamin (MULTIVITAMIN WITH MINERALS) TABS Take 1 tablet by mouth daily.      Marland Kitchen omega-3 acid ethyl esters (LOVAZA) 1 G capsule Take 1 g by mouth daily.      Marland Kitchen  oxyCODONE-acetaminophen (PERCOCET/ROXICET) 5-325 MG per tablet Take 2 tablets by mouth every 4 (four) hours as needed for pain.  15 tablet  0  . QUEtiapine (SEROQUEL) 25 MG tablet Take 25 mg by mouth at bedtime.      . simvastatin (ZOCOR) 40 MG tablet Take 40 mg by mouth at bedtime.      Marland Kitchen tiZANidine (ZANAFLEX) 4 MG tablet Take 4 mg by mouth every 8 (eight) hours as needed for muscle spasms.      . vitamin E (VITAMIN E) 400 UNIT capsule Take 400 Units by mouth daily.      Marland Kitchen zolpidem (AMBIEN) 5 MG tablet Take 10 mg by mouth at bedtime as needed for sleep.       No current facility-administered medications on file prior to visit.        Objective:   Physical Exam  Filed Vitals:   08/13/13 1500  BP: 90/60  Pulse: 84  Temp: 98 F (36.7 C)  Height: 4\' 11"  (1.499 m)  Weight: 122 lb 6.4 oz (55.52 kg)  Alert and oriented. Skin warm and dry. Oral mucosa is moist.   .  Sclera anicteric, conjunctivae is pink. Thyroid not enlarged. No cervical lymphadenopathy. Lungs clear. Heart regular rate and rhythm.  Abdomen is soft. Bowel sounds are positive. No hepatomegaly. No abdominal masses felt. No tenderness.  No edema to lower extremities.       Assessment:     Nausea and vomiting. Hx of cholelithiasis. Possible stone in CBD.  Very little acid reflux. Controlled with Nexium.     Plan:     Hepatic function. If normal, MRCP.   I will discuss with Dr Laural Golden 1st before proceeding. If abnormal possible ERCP.

## 2013-08-14 ENCOUNTER — Other Ambulatory Visit (INDEPENDENT_AMBULATORY_CARE_PROVIDER_SITE_OTHER): Payer: Self-pay | Admitting: *Deleted

## 2013-08-14 DIAGNOSIS — R112 Nausea with vomiting, unspecified: Secondary | ICD-10-CM

## 2013-08-14 LAB — HEPATIC FUNCTION PANEL
ALBUMIN: 3.5 g/dL (ref 3.5–5.2)
ALK PHOS: 70 U/L (ref 39–117)
ALT: 11 U/L (ref 0–35)
AST: 19 U/L (ref 0–37)
Bilirubin, Direct: 0.2 mg/dL (ref 0.0–0.3)
Indirect Bilirubin: 0.4 mg/dL (ref 0.2–1.2)
TOTAL PROTEIN: 5.3 g/dL — AB (ref 6.0–8.3)
Total Bilirubin: 0.6 mg/dL (ref 0.2–1.2)

## 2013-08-19 ENCOUNTER — Encounter (HOSPITAL_COMMUNITY)
Admission: RE | Disposition: A | Discharge status: Home / Self Care | Payer: Self-pay | Source: Ambulatory Visit | Attending: Internal Medicine

## 2013-08-19 ENCOUNTER — Encounter (HOSPITAL_COMMUNITY): Payer: Self-pay | Admitting: *Deleted

## 2013-08-19 ENCOUNTER — Ambulatory Visit (HOSPITAL_COMMUNITY)
Admission: RE | Admit: 2013-08-19 | Discharge: 2013-08-19 | Disposition: A | Discharge status: Home / Self Care | Payer: PRIVATE HEALTH INSURANCE | Source: Ambulatory Visit | Attending: Internal Medicine | Admitting: Internal Medicine

## 2013-08-19 DIAGNOSIS — I10 Essential (primary) hypertension: Secondary | ICD-10-CM | POA: Insufficient documentation

## 2013-08-19 DIAGNOSIS — K449 Diaphragmatic hernia without obstruction or gangrene: Secondary | ICD-10-CM

## 2013-08-19 DIAGNOSIS — Z79899 Other long term (current) drug therapy: Secondary | ICD-10-CM | POA: Diagnosis not present

## 2013-08-19 DIAGNOSIS — F411 Generalized anxiety disorder: Secondary | ICD-10-CM | POA: Insufficient documentation

## 2013-08-19 DIAGNOSIS — K900 Celiac disease: Secondary | ICD-10-CM | POA: Diagnosis not present

## 2013-08-19 DIAGNOSIS — K2970 Gastritis, unspecified, without bleeding: Secondary | ICD-10-CM

## 2013-08-19 DIAGNOSIS — R112 Nausea with vomiting, unspecified: Secondary | ICD-10-CM

## 2013-08-19 DIAGNOSIS — R1013 Epigastric pain: Secondary | ICD-10-CM | POA: Diagnosis present

## 2013-08-19 DIAGNOSIS — K219 Gastro-esophageal reflux disease without esophagitis: Secondary | ICD-10-CM | POA: Insufficient documentation

## 2013-08-19 DIAGNOSIS — K2990 Gastroduodenitis, unspecified, without bleeding: Secondary | ICD-10-CM

## 2013-08-19 DIAGNOSIS — K2960 Other gastritis without bleeding: Secondary | ICD-10-CM | POA: Insufficient documentation

## 2013-08-19 HISTORY — PX: ESOPHAGOGASTRODUODENOSCOPY: SHX5428

## 2013-08-19 SURGERY — EGD (ESOPHAGOGASTRODUODENOSCOPY)
Anesthesia: Moderate Sedation

## 2013-08-19 MED ORDER — MEPERIDINE HCL 50 MG/ML IJ SOLN
INTRAMUSCULAR | Status: DC | PRN
  Administered 2013-08-19 (×2): 25 mg via INTRAVENOUS

## 2013-08-19 MED ORDER — BUTAMBEN-TETRACAINE-BENZOCAINE 2-2-14 % EX AERO
INHALATION_SPRAY | CUTANEOUS | Status: DC | PRN
  Administered 2013-08-19: 2 via TOPICAL

## 2013-08-19 MED ORDER — SODIUM CHLORIDE 0.9 % IV SOLN
INTRAVENOUS | Status: DC
  Administered 2013-08-19: 1000 mL via INTRAVENOUS

## 2013-08-19 MED ORDER — MEPERIDINE HCL 50 MG/ML IJ SOLN
INTRAMUSCULAR | Status: AC
  Filled 2013-08-19: qty 1

## 2013-08-19 MED ORDER — MIDAZOLAM HCL 5 MG/5ML IJ SOLN
INTRAMUSCULAR | Status: AC
  Filled 2013-08-19: qty 10

## 2013-08-19 MED ORDER — MIDAZOLAM HCL 5 MG/5ML IJ SOLN
INTRAMUSCULAR | Status: DC | PRN
  Administered 2013-08-19: 2 mg via INTRAVENOUS
  Administered 2013-08-19: 1 mg via INTRAVENOUS
  Administered 2013-08-19: 2 mg via INTRAVENOUS

## 2013-08-19 NOTE — Discharge Instructions (Signed)
Resume usual medications; Clear liquids today and then gastroparesis diet. No driving for 24 hours. Physician will call with results of biopsy and blood test.  Gastrointestinal Endoscopy Care After Refer to this sheet in the next few weeks. These instructions provide you with information on caring for yourself after your procedure. Your caregiver may also give you more specific instructions. Your treatment has been planned according to current medical practices, but problems sometimes occur. Call your caregiver if you have any problems or questions after your procedure. HOME CARE INSTRUCTIONS  If you were given medicine to help you relax (sedative), do not drive, operate machinery, or sign important documents for 24 hours.  Avoid alcohol and hot or warm beverages for the first 24 hours after the procedure.  Only take over-the-counter or prescription medicines for pain, discomfort, or fever as directed by your caregiver. You may resume taking your normal medicines unless your caregiver tells you otherwise. Ask your caregiver when you may resume taking medicines that may cause bleeding, such as aspirin, clopidogrel, or warfarin.  You may return to your normal diet and activities on the day after your procedure, or as directed by your caregiver. Walking may help to reduce any bloated feeling in your abdomen.  Drink enough fluids to keep your urine clear or pale yellow.  You may gargle with salt water if you have a sore throat. SEEK IMMEDIATE MEDICAL CARE IF:  You have severe nausea or vomiting.  You have severe abdominal pain, abdominal cramps that last longer than 6 hours, or abdominal swelling (distention).  You have severe shoulder or back pain.  You have trouble swallowing.  You have shortness of breath, your breathing is shallow, or you are breathing faster than normal.  You have a fever or a rapid heartbeat.  You vomit blood or material that looks like coffee grounds.  You  have bloody, black, or tarry stools. MAKE SURE YOU:  Understand these instructions.  Will watch your condition.  Will get help right away if you are not doing well or get worse. Document Released: 09/21/2003 Document Revised: 08/08/2011 Document Reviewed: 05/09/2011 [facility] Patient Information 2015 [place], [place]. This information is not intended to replace advice given to you by your health care provider. Make sure you discuss any questions you have with your health care provider.   Clear Liquid Diet A clear liquid diet is a short-term diet that is prescribed to provide the necessary fluid and basic energy you need when you can have nothing else. The clear liquid diet consists of liquids or solids that will become liquid at room temperature. You should be able to see through the liquid. There are many reasons that you may be restricted to clear liquids, such as:  When you have a sudden-onset (acute) condition that occurs before or after surgery.  To help your body slowly get adjusted to food again after a long period when you were unable to have food.  Replacement of fluids when you have a diarrheal disease.  When you are going to have certain exams, such as a colonoscopy, in which instruments are inserted inside your body to look at parts of your digestive system. WHAT CAN I HAVE? A clear liquid diet does not provide all the nutrients you need. It is important to choose a variety of the following items to get as many nutrients as possible:  Vegetable juices that do not have pulp.  Fruit juices and fruit drinks that do not have pulp.  Coffee (regular or  decaffeinated), tea, or soda at the discretion of your health care provider.  Clear bouillon, broth, or strained broth-based soups.  High-protein and flavored gelatins.  Sugar or honey.  Ices or frozen ice pops that do not contain milk. If you are not sure whether you can have certain items, you should ask your health care  provider. You may also ask your health care provider if there are any other clear liquid options. Document Released: 02/06/2005 Document Revised: 02/11/2013 Document Reviewed: 01/03/2013 [facility] Patient Information 2015 [place], [place]. This information is not intended to replace advice given to you by your health care provider. Make sure you discuss any questions you have with your health care provider.   Gastroparesis  Gastroparesis is also called slowed stomach emptying (delayed gastric emptying). It is a condition in which the stomach takes too long to empty its contents. It often happens in people with diabetes.  CAUSES  Gastroparesis happens when nerves to the stomach are damaged or stop working. When the nerves are damaged, the muscles of the stomach and intestines do not work normally. The movement of food is slowed or stopped. High blood glucose (sugar) causes changes in nerves and can damage the blood vessels that carry oxygen and nutrients to the nerves. RISK FACTORS  Diabetes.  Post-viral syndromes.  Eating disorders (anorexia, bulimia).  Surgery on the stomach or vagus nerve.  Gastroesophageal reflux disease (rarely).  Smooth muscle disorders (amyloidosis, scleroderma).  Metabolic disorders, including hypothyroidism.  Parkinson disease. SYMPTOMS   Heartburn.  Feeling sick to your stomach (nausea).  Vomiting of undigested food.  An early feeling of fullness when eating.  Weight loss.  Abdominal bloating.  Erratic blood glucose levels.  Lack of appetite.  Gastroesophageal reflux.  Spasms of the stomach wall. Complications can include:  Bacterial overgrowth in stomach. Food stays in the stomach and can ferment and cause bacteria to grow.  Weight loss due to difficulty digesting and absorbing nutrients.  Vomiting.  Obstruction in the stomach. Undigested food can harden and cause nausea and vomiting.  Blood glucose fluctuations caused by inconsistent  food absorption. DIAGNOSIS  The diagnosis of gastroparesis is confirmed through one or more of the following tests:  Barium X-rays and scans. These tests look at how long it takes for food to move through the stomach.  Gastric manometry. This test measures electrical and muscular activity in the stomach. A thin tube is passed down the throat into the stomach. The tube contains a wire that takes measurements of the stomach's electrical and muscular activity as it digests liquids and solid food.  Endoscopy. This procedure is done with a long, thin tube called an endoscope. It is passed through the mouth and gently down the esophagus into the stomach. This tube helps the caregiver look at the lining of the stomach to check for any abnormalities.  Ultrasonography. This can rule out gallbladder disease or pancreatitis. This test will outline and define the shape of the gallbladder and pancreas. TREATMENT   Treatments may include:  Exercise.  Medicines to control nausea and vomiting.  Medicines to stimulate stomach muscles.  Changes in what and when you eat.  Having smaller meals more often.  Eating low-fiber forms of high-fiber foods, such as eating cooked vegetables instead of raw vegetables.  Eating low-fat foods.  Consuming liquids, which are easier to digest.  In severe cases, feeding tubes and intravenous (IV) feeding may be needed. It is important to note that in most cases, treatment does not cure gastroparesis.  It is usually a lasting (chronic) condition. Treatment helps you manage the underlying condition so that you can be as healthy and comfortable as possible. Other treatments  A gastric neurostimulator has been developed to assist people with gastroparesis. The battery-operated device is surgically implanted. It emits mild electrical pulses to help improve stomach emptying and to control nausea and vomiting.  The use of botulinum toxin has been shown to improve stomach  emptying by decreasing the prolonged contractions of the muscle between the stomach and the small intestine (pyloric sphincter). The benefits are temporary. SEEK MEDICAL CARE IF:   You have diabetes and you are having problems keeping your blood glucose in goal range.  You are having nausea, vomiting, bloating, or early feelings of fullness with eating.  Your symptoms do not change with a change in diet. Document Released: 02/06/2005 Document Revised: 06/03/2012 Document Reviewed: 07/16/2008 [facility] Patient Information 2015 [place], [place]. This information is not intended to replace advice given to you by your health care provider. Make sure you discuss any questions you have with your health care provider.

## 2013-08-19 NOTE — H&P (Signed)
[name] [name] is an 73 y.o. female.   Chief Complaint: Patient is here for EGD. HPI: Patient is 73 year old Caucasian female who presents with a few months history of intermittent regurgitation and vomiting. She denies hematemesis or melena. She has a dull pain in the epigastric region usually at night. She is on Nexium and she says controls her heartburn but not regurgitation or vomiting. She has not lost any weight recently. She had ultrasound in January of this year revealing cholelithiasis and bile duct measured 7.7 mm. Following visit to our office on 08/13/2013 she had LFTs which were within normal limits. Patient is on NSAID for chronic back pain.  Past Medical History  Diagnosis Date  . Hypertension   . Hypercholesteremia   . Anxiety   . Depression   . GERD (gastroesophageal reflux disease)     Past Surgical History  Procedure Laterality Date  . Breast reduction surgery    . Cesarean section    . Hemorrhoid surgery    . Tonsillectomy    . Colonoscopy  08/2005  . Excision of submandibular gland    . Stomach surgery      years ago. ? reason    History reviewed. No pertinent family history. Social History:  reports that she has never smoked. She does not have any smokeless tobacco history on file. She reports that she does not drink alcohol or use illicit drugs.  Allergies:  Allergies  Allergen Reactions  . Morphine And Related Other (See Comments)    Patient stated that she thinks that morphine was one of the medication that she had a bad reaction to. She can not remember the other. She said that she blacked out when she took it.  . Other     Allergic to 2 meds, does not know what they are.    Medications Prior to Admission  Medication Sig Dispense Refill  . alendronate (FOSAMAX) 70 MG tablet Take 70 mg by mouth once a week. Take with a full glass of water on an empty stomach.      Marland Kitchen atenolol (TENORMIN) 50 MG tablet Take 50 mg by mouth daily.      . celecoxib  (CELEBREX) 200 MG capsule Take 200 mg by mouth daily.      . Cyanocobalamin (VITAMIN B 12 PO) Take 1 tablet by mouth daily.      Marland Kitchen esomeprazole (NEXIUM) 40 MG capsule Take 40 mg by mouth daily at 12 noon.      . Eszopiclone (ESZOPICLONE) 3 MG TABS Take 3 mg by mouth at bedtime. Take immediately before bedtime      . furosemide (LASIX) 40 MG tablet Take 40 mg by mouth daily.      Marland Kitchen LORazepam (ATIVAN) 1 MG tablet Take 1 mg by mouth 2 (two) times daily. scheduled      . Multiple Vitamin (MULTIVITAMIN WITH MINERALS) TABS Take 1 tablet by mouth daily.      Marland Kitchen omega-3 acid ethyl esters (LOVAZA) 1 G capsule Take 1 g by mouth daily.      Marland Kitchen oxyCODONE-acetaminophen (PERCOCET/ROXICET) 5-325 MG per tablet Take 2 tablets by mouth every 4 (four) hours as needed for pain.  15 tablet  0  . QUEtiapine (SEROQUEL) 25 MG tablet Take 25 mg by mouth at bedtime.      . simvastatin (ZOCOR) 40 MG tablet Take 40 mg by mouth at bedtime.      Marland Kitchen tiZANidine (ZANAFLEX) 4 MG tablet Take 4 mg by mouth every  8 (eight) hours as needed for muscle spasms.      . vitamin E (VITAMIN E) 400 UNIT capsule Take 400 Units by mouth daily.      Marland Kitchen zolpidem (AMBIEN) 5 MG tablet Take 10 mg by mouth at bedtime as needed for sleep.        No results found for this or any previous visit (from the past 48 hour(s)). No results found.  ROS  Blood pressure 131/80, pulse 73, temperature 97.8 F (36.6 C), temperature source Oral, resp. rate 18, height 4\' 11"  (1.499 m), weight 122 lb (55.339 kg), SpO2 97.00%. Physical Exam  Constitutional: She appears well-developed and well-nourished.  HENT:  Mouth/Throat: Oropharynx is clear and moist.  Eyes: Conjunctivae are normal. No scleral icterus.  Neck: No thyromegaly present.  Cardiovascular: Normal rate, regular rhythm and normal heart sounds.   No murmur heard. Respiratory: Effort normal and breath sounds normal.  GI: Soft. She exhibits no distension. Tenderness: mild midepigastric tenderness.   Upper midline scar  Musculoskeletal: She exhibits no edema.  Lymphadenopathy:    She has no cervical adenopathy.  Neurological: She is alert.  Skin: Skin is warm and dry.     Assessment/Plan Nausea vomiting and epigastric pain. Patient on NSAID therapy and has cholelithiasis. Diagnostic EGD to rule out peptic ulcer disease prior to recommending cholecystectomy.  REHMAN,NAJEEB U 08/19/2013, 7:38 AM

## 2013-08-19 NOTE — Op Note (Signed)
EGD PROCEDURE REPORT  PATIENT:  [name] [name]  MR#:  [id number] Birthdate:  09-27-1940, 73 y.o., female Endoscopist:  Dr. Rogene Houston, MD Referred By:  Dr. Percell Miller at [facility] Pulling, MD  Procedure Date: 08/19/2013  Procedure:   EGD  Indications:  Patient is 73 year old Caucasian female who presents with intermittent nausea and vomiting and epigastric pain. These symptoms started 2 months ago. She has known cholelithiasis. She is also on NSAID for chronic back pain. She is also on Nexium for GERD and heartburn is well-controlled with not other symptoms. She is undergoing diagnostic EGD. If EGD is normal cholecystectomy would be recommended.            Informed Consent:  The risks, benefits, alternatives & imponderables which include, but are not limited to, bleeding, infection, perforation, drug reaction and potential missed lesion have been reviewed.  The potential for biopsy, lesion removal, esophageal dilation, etc. have also been discussed.  Questions have been answered.  All parties agreeable.  Please see history & physical in medical record for more information.  Medications:  Demerol 50 mg IV Versed 5 mg IV Cetacaine spray topically for oropharyngeal anesthesia  Description of procedure:  The endoscope was introduced through the mouth and advanced to the second portion of the duodenum without difficulty or limitations. The mucosal surfaces were surveyed very carefully during advancement of the scope and upon withdrawal.  Findings:  Esophagus:  Mucosa of the esophagus was normal. GE junction was unremarkable. GEJ:  30 cm Hiatus:  32 cm Stomach:  Moderate amount of food debris noted in the stomach extended from the gastric body to prepyloric region , pyloric channel was wide-open. Patchy erythema noted to antral mucosa. Mucosa at fundus and cardia was unremarkable. Duodenum:  Bulbar mucosa was normal. Post bulbar mucosa was abnormal with short take or blunted villi. Therefore biopsy was  taken.  Therapeutic/Diagnostic Maneuvers Performed:   As above biopsy was taken from second part of the duodenum and also from the bulb and submitted together.  Complications:  None  Impression: Small sliding hiatal hernia. Moderate amount of food debris in the stomach with  patent pylorus indicative of gastroparesis. Nonerosive antral gastritis. Abnormal appearence to post bulbar mucosa. Biopsy taken to rule out celiac disease.  Recommendations:  Clear liquids for 24 hours. Gastroparesis diet. H. pylori serology. I will be contacting patient with results of biopsy and blood test and possibly schedule her for  gastric emptying study.  REHMAN,NAJEEB U  08/19/2013  8:03 AM  CC: Dr. Alonza Bogus, MD & Dr. Rayne Du ref. provider found

## 2013-08-20 LAB — H. PYLORI ANTIBODY, IGG: H Pylori IgG: 3.64 {ISR} — ABNORMAL HIGH

## 2013-08-25 ENCOUNTER — Telehealth (INDEPENDENT_AMBULATORY_CARE_PROVIDER_SITE_OTHER): Payer: Self-pay | Admitting: *Deleted

## 2013-08-25 DIAGNOSIS — R112 Nausea with vomiting, unspecified: Secondary | ICD-10-CM

## 2013-08-25 NOTE — Telephone Encounter (Signed)
Lab was noted on earlier order

## 2013-08-25 NOTE — Telephone Encounter (Signed)
Per Dr.Rehman the patient will need to have labs drawn. Patient will need an office visit after lab work.Marland Kitchen

## 2013-08-27 LAB — GLIA (IGA/G) + TTG IGA
GLIADIN IGA: 3.6 U/mL (ref <20)
GLIADIN IGG: 4.4 U/mL (ref <20)
Tissue Transglutaminase Ab, IgA: 2.3 U/mL (ref <20)

## 2013-09-01 ENCOUNTER — Encounter (INDEPENDENT_AMBULATORY_CARE_PROVIDER_SITE_OTHER): Payer: Self-pay | Admitting: *Deleted

## 2014-01-06 ENCOUNTER — Other Ambulatory Visit: Payer: Self-pay | Admitting: Pulmonary Disease

## 2014-01-06 DIAGNOSIS — Z1231 Encounter for screening mammogram for malignant neoplasm of breast: Secondary | ICD-10-CM

## 2014-01-23 ENCOUNTER — Ambulatory Visit
Admission: RE | Admit: 2014-01-23 | Discharge: 2014-01-23 | Disposition: A | Discharge status: Home / Self Care | Payer: PRIVATE HEALTH INSURANCE | Source: Ambulatory Visit | Attending: Pulmonary Disease | Admitting: Pulmonary Disease

## 2014-01-23 DIAGNOSIS — Z1231 Encounter for screening mammogram for malignant neoplasm of breast: Secondary | ICD-10-CM

## 2014-03-05 ENCOUNTER — Ambulatory Visit (HOSPITAL_COMMUNITY)
Admission: RE | Admit: 2014-03-05 | Discharge: 2014-03-05 | Disposition: A | Discharge status: Home / Self Care | Payer: Medicare Other | Source: Ambulatory Visit | Attending: Pulmonary Disease | Admitting: Pulmonary Disease

## 2014-03-05 ENCOUNTER — Other Ambulatory Visit (HOSPITAL_COMMUNITY): Payer: Self-pay | Admitting: Pulmonary Disease

## 2014-03-05 DIAGNOSIS — K449 Diaphragmatic hernia without obstruction or gangrene: Secondary | ICD-10-CM | POA: Diagnosis not present

## 2014-03-05 DIAGNOSIS — J209 Acute bronchitis, unspecified: Secondary | ICD-10-CM

## 2014-03-05 DIAGNOSIS — R05 Cough: Secondary | ICD-10-CM | POA: Diagnosis not present

## 2014-03-05 DIAGNOSIS — I10 Essential (primary) hypertension: Secondary | ICD-10-CM | POA: Diagnosis not present

## 2014-05-19 DIAGNOSIS — M545 Low back pain: Secondary | ICD-10-CM | POA: Diagnosis not present

## 2014-05-19 DIAGNOSIS — K210 Gastro-esophageal reflux disease with esophagitis: Secondary | ICD-10-CM | POA: Diagnosis not present

## 2014-05-19 DIAGNOSIS — F419 Anxiety disorder, unspecified: Secondary | ICD-10-CM | POA: Diagnosis not present

## 2014-05-19 DIAGNOSIS — I10 Essential (primary) hypertension: Secondary | ICD-10-CM | POA: Diagnosis not present

## 2014-11-03 ENCOUNTER — Ambulatory Visit (HOSPITAL_COMMUNITY)
Admission: RE | Admit: 2014-11-03 | Discharge: 2014-11-03 | Disposition: A | Discharge status: Home / Self Care | Payer: Medicare Other | Source: Ambulatory Visit | Attending: Pulmonary Disease | Admitting: Pulmonary Disease

## 2014-11-03 ENCOUNTER — Other Ambulatory Visit (HOSPITAL_COMMUNITY): Payer: Self-pay | Admitting: Pulmonary Disease

## 2014-11-03 DIAGNOSIS — M13862 Other specified arthritis, left knee: Secondary | ICD-10-CM | POA: Diagnosis not present

## 2014-11-03 DIAGNOSIS — M129 Arthropathy, unspecified: Secondary | ICD-10-CM | POA: Diagnosis not present

## 2014-11-03 DIAGNOSIS — I10 Essential (primary) hypertension: Secondary | ICD-10-CM | POA: Diagnosis not present

## 2014-11-03 DIAGNOSIS — M25562 Pain in left knee: Secondary | ICD-10-CM | POA: Diagnosis not present

## 2014-11-03 DIAGNOSIS — Z0000 Encounter for general adult medical examination without abnormal findings: Secondary | ICD-10-CM | POA: Diagnosis not present

## 2014-11-03 DIAGNOSIS — M25561 Pain in right knee: Secondary | ICD-10-CM | POA: Diagnosis not present

## 2014-11-03 DIAGNOSIS — E785 Hyperlipidemia, unspecified: Secondary | ICD-10-CM | POA: Diagnosis not present

## 2014-11-03 DIAGNOSIS — G8929 Other chronic pain: Secondary | ICD-10-CM | POA: Insufficient documentation

## 2014-11-03 DIAGNOSIS — K210 Gastro-esophageal reflux disease with esophagitis: Secondary | ICD-10-CM | POA: Diagnosis not present

## 2014-11-03 DIAGNOSIS — M170 Bilateral primary osteoarthritis of knee: Secondary | ICD-10-CM | POA: Diagnosis not present

## 2014-11-03 DIAGNOSIS — F419 Anxiety disorder, unspecified: Secondary | ICD-10-CM | POA: Diagnosis not present

## 2014-11-16 DIAGNOSIS — Z1211 Encounter for screening for malignant neoplasm of colon: Secondary | ICD-10-CM | POA: Diagnosis not present

## 2014-11-25 ENCOUNTER — Ambulatory Visit (INDEPENDENT_AMBULATORY_CARE_PROVIDER_SITE_OTHER): Payer: Medicare Other | Admitting: Internal Medicine

## 2014-11-27 ENCOUNTER — Encounter (INDEPENDENT_AMBULATORY_CARE_PROVIDER_SITE_OTHER): Payer: Self-pay | Admitting: *Deleted

## 2014-12-02 ENCOUNTER — Ambulatory Visit (INDEPENDENT_AMBULATORY_CARE_PROVIDER_SITE_OTHER): Payer: Medicare Other | Admitting: Internal Medicine

## 2014-12-07 ENCOUNTER — Telehealth (INDEPENDENT_AMBULATORY_CARE_PROVIDER_SITE_OTHER): Payer: Self-pay | Admitting: *Deleted

## 2014-12-07 ENCOUNTER — Encounter (INDEPENDENT_AMBULATORY_CARE_PROVIDER_SITE_OTHER): Payer: Self-pay | Admitting: Internal Medicine

## 2014-12-07 ENCOUNTER — Ambulatory Visit (INDEPENDENT_AMBULATORY_CARE_PROVIDER_SITE_OTHER): Payer: Medicare Other | Admitting: Internal Medicine

## 2014-12-07 VITALS — BP 130/80 | HR 70 | Temp 97.4°F | Resp 18 | Ht 59.0 in | Wt 121.4 lb

## 2014-12-07 DIAGNOSIS — Z1211 Encounter for screening for malignant neoplasm of colon: Secondary | ICD-10-CM

## 2014-12-07 DIAGNOSIS — R1013 Epigastric pain: Secondary | ICD-10-CM

## 2014-12-07 DIAGNOSIS — B9681 Helicobacter pylori [H. pylori] as the cause of diseases classified elsewhere: Secondary | ICD-10-CM

## 2014-12-07 DIAGNOSIS — K625 Hemorrhage of anus and rectum: Secondary | ICD-10-CM | POA: Diagnosis not present

## 2014-12-07 DIAGNOSIS — K2970 Gastritis, unspecified, without bleeding: Secondary | ICD-10-CM

## 2014-12-07 MED ORDER — BIS SUBCIT-METRONID-TETRACYC 140-125-125 MG PO CAPS: 3.0000 | ORAL_CAPSULE | Freq: Three times a day (TID) | ORAL | Status: DC

## 2014-12-07 MED ORDER — CELECOXIB 100 MG PO CAPS: 100.0000 mg | ORAL_CAPSULE | Freq: Every day | ORAL | Status: DC

## 2014-12-07 NOTE — Telephone Encounter (Signed)
Patient needs trilyte 

## 2014-12-07 NOTE — Patient Instructions (Addendum)
Colonoscopy be scheduled. High-fiber diet. Remember stools turned black while you are taking Pylera.

## 2014-12-07 NOTE — Progress Notes (Signed)
Presenting complaint;  Rectal bleeding epigastric pain nausea and vomiting.  Subjective:  Patient is 74 year old Caucasian female who is here for scheduled visit. She was last seen in June 2015 for epigastric pain nausea vomiting and cholelithiasis. CBD was mildly dilated 7.7 mm. She underwent EGD revealing small sliding hiatal hernia food debris in the stomach with patent pylorus nonerosive antral gastritis and H. pylori was positive and duodenal mucosa was abnormal and biopsy showed changes of celiac disease. Gluten-free diet was recommended. Patient was supposed to come back to the office to discuss the results and initiate therapy for H. pylori gastritis but she did not come. She has multiple complaints. She has epigastric pain almost every evening. She has nausea and vomiting at least once a week. She denies hematemesis. While she has burning epigastric pain she does not experience heartburn. Few months ago she had an episode of rectal bleeding when she passed large amount of blood per rectum. She was treated with suppositories by Dr. Luan Pulling and she hasn't had any more episodes but she has noted small amount of blood and tissue when she is constipated. She feels constipation is because of pain medication which she takes for her back pain. Since she has been taking Colace her stools are soft. She states she is not able to stay on gluten-free diet because of cost. She says she has very limited financial resources. She has been under a lot of stress. Her son aged 23 was shot dead by his wife in 04/08/22 this year. She is on Celebrex but does not take other OTC NSAIDs.  Patient's last colonoscopy was in July 2007 and she was noted have cluster of small polyps in sigmoid colon which turned out to be hyperplastic polyps.   Current Medications: Outpatient Encounter Prescriptions as of 12/07/2014  Medication Sig  . alendronate (FOSAMAX) 70 MG tablet Take 70 mg by mouth once a week. Take with a full  glass of water on an empty stomach.  Marland Kitchen atenolol (TENORMIN) 50 MG tablet Take 50 mg by mouth daily.  . celecoxib (CELEBREX) 200 MG capsule Take 200 mg by mouth daily.  . Cyanocobalamin (VITAMIN B 12 PO) Take 1 tablet by mouth daily.  Marland Kitchen esomeprazole (NEXIUM) 40 MG capsule Take 40 mg by mouth daily at 12 noon.  . Eszopiclone (ESZOPICLONE) 3 MG TABS Take 3 mg by mouth at bedtime. Take immediately before bedtime  . furosemide (LASIX) 40 MG tablet Take 40 mg by mouth daily.  Marland Kitchen LORazepam (ATIVAN) 1 MG tablet Take 1 mg by mouth 2 (two) times daily. scheduled  . Multiple Vitamin (MULTIVITAMIN WITH MINERALS) TABS Take 1 tablet by mouth daily.  Marland Kitchen omega-3 acid ethyl esters (LOVAZA) 1 G capsule Take 1 g by mouth daily.  Marland Kitchen oxyCODONE-acetaminophen (PERCOCET/ROXICET) 5-325 MG per tablet Take 2 tablets by mouth every 4 (four) hours as needed for pain.  Marland Kitchen QUEtiapine (SEROQUEL) 25 MG tablet Take 25 mg by mouth at bedtime.  . simvastatin (ZOCOR) 40 MG tablet Take 40 mg by mouth at bedtime.  Marland Kitchen tiZANidine (ZANAFLEX) 4 MG tablet Take 4 mg by mouth every 8 (eight) hours as needed for muscle spasms.  . vitamin E (VITAMIN E) 400 UNIT capsule Take 400 Units by mouth daily.  Marland Kitchen zolpidem (AMBIEN) 5 MG tablet Take 5 mg by mouth at bedtime as needed for sleep.    No facility-administered encounter medications on file as of 12/07/2014.     Objective: Blood pressure 130/80, pulse 70, temperature 97.4 F (  36.3 C), temperature source Oral, resp. rate 18, height 4\' 11"  (1.499 m), weight 121 lb 6.4 oz (55.067 kg). Patient is alert and in no acute distress. Conjunctiva is pink. Sclera is nonicteric Oropharyngeal mucosa is normal. She has upper dentures and her own teeth lower jaw. No neck masses or thyromegaly noted. Cardiac exam with regular rhythm normal S1 and S2. No murmur or gallop noted. Lungs are clear to auscultation. Abdomen is symmetrical and soft with mild midepigastric tenderness. No organomegaly or masses. No  LE edema or clubbing noted.  Labs/studies Results: LFTs from 08/13/2013  Bilirubin 0.6, AP 70, AST 19, ALT 11, total protein 5.3 and albumin 3.5.  Tissue transglutaminase IgA antibody, gliadin IgG and IgA were negative.    Assessment:  #1. Epigastric pain nausea and vomiting. This symptom complex could be secondary to peptic ulcer disease and stent setting of chronic NSAID therapy or it could be secondary to cholelithiasis. #2. H pylori gastritis. She was not treated last year because she did not return for follow-up visit. #3. Celiac disease based on due to biopsy but she is not 100% compliance. #4. History of rectal bleeding possibly secondary to hemorrhoids but other lesions need to be ruled out. Last colonoscopy was in July 2007.   Plan:  Decrease Celebrex dose 200 mg by mouth daily. Request copy of blood work from Dr. Luan Pulling office. May need LFTs and she had tests done recently. Pylera 3 capsules by mouth 4 times a day for 10 days. Samples provided to the patient along with instructions. She was told that stools will turn black while she is on Pylera. Will schedule for colonoscopy within the next few weeks. If epigastric pain nausea and vomiting persists despite therapy for H. pylori gastritis and decrease in NSAID dose will consider EGD.

## 2014-12-08 ENCOUNTER — Other Ambulatory Visit (INDEPENDENT_AMBULATORY_CARE_PROVIDER_SITE_OTHER): Payer: Self-pay | Admitting: *Deleted

## 2014-12-08 DIAGNOSIS — K625 Hemorrhage of anus and rectum: Secondary | ICD-10-CM

## 2014-12-08 DIAGNOSIS — R1013 Epigastric pain: Principal | ICD-10-CM

## 2014-12-08 DIAGNOSIS — G8929 Other chronic pain: Secondary | ICD-10-CM

## 2014-12-08 MED ORDER — PEG 3350-KCL-NA BICARB-NACL 420 G PO SOLR: 4000.0000 mL | Freq: Once | ORAL | Status: DC

## 2015-01-20 ENCOUNTER — Encounter (HOSPITAL_COMMUNITY): Admission: RE | Payer: Self-pay | Source: Ambulatory Visit

## 2015-01-20 ENCOUNTER — Ambulatory Visit (HOSPITAL_COMMUNITY): Admission: RE | Admit: 2015-01-20 | Payer: Medicare Other | Source: Ambulatory Visit | Admitting: Internal Medicine

## 2015-01-20 SURGERY — COLONOSCOPY
Anesthesia: Moderate Sedation

## 2015-02-15 ENCOUNTER — Other Ambulatory Visit (INDEPENDENT_AMBULATORY_CARE_PROVIDER_SITE_OTHER): Payer: Self-pay | Admitting: Internal Medicine

## 2015-02-18 NOTE — Telephone Encounter (Signed)
Future refills will need to come from the patient's PCP.

## 2015-05-03 DIAGNOSIS — B9681 Helicobacter pylori [H. pylori] as the cause of diseases classified elsewhere: Secondary | ICD-10-CM | POA: Diagnosis not present

## 2015-05-03 DIAGNOSIS — M545 Low back pain: Secondary | ICD-10-CM | POA: Diagnosis not present

## 2015-05-03 DIAGNOSIS — I10 Essential (primary) hypertension: Secondary | ICD-10-CM | POA: Diagnosis not present

## 2015-05-03 DIAGNOSIS — K900 Celiac disease: Secondary | ICD-10-CM | POA: Diagnosis not present

## 2015-05-14 ENCOUNTER — Other Ambulatory Visit (INDEPENDENT_AMBULATORY_CARE_PROVIDER_SITE_OTHER): Payer: Self-pay | Admitting: Internal Medicine

## 2015-05-17 NOTE — Telephone Encounter (Signed)
Future Rx will need to be approved by PCP.

## 2015-06-19 ENCOUNTER — Encounter (HOSPITAL_COMMUNITY): Payer: Self-pay | Admitting: Emergency Medicine

## 2015-06-19 ENCOUNTER — Emergency Department (HOSPITAL_COMMUNITY)
Admission: EM | Admit: 2015-06-19 | Discharge: 2015-06-19 | Disposition: A | Discharge status: Home / Self Care | Payer: Medicare Other | Attending: Emergency Medicine | Admitting: Emergency Medicine

## 2015-06-19 DIAGNOSIS — F329 Major depressive disorder, single episode, unspecified: Secondary | ICD-10-CM | POA: Insufficient documentation

## 2015-06-19 DIAGNOSIS — K029 Dental caries, unspecified: Secondary | ICD-10-CM | POA: Insufficient documentation

## 2015-06-19 DIAGNOSIS — I10 Essential (primary) hypertension: Secondary | ICD-10-CM | POA: Insufficient documentation

## 2015-06-19 DIAGNOSIS — Z79899 Other long term (current) drug therapy: Secondary | ICD-10-CM | POA: Insufficient documentation

## 2015-06-19 DIAGNOSIS — E7800 Pure hypercholesterolemia, unspecified: Secondary | ICD-10-CM | POA: Diagnosis not present

## 2015-06-19 DIAGNOSIS — K0889 Other specified disorders of teeth and supporting structures: Secondary | ICD-10-CM | POA: Diagnosis present

## 2015-06-19 MED ORDER — AMOXICILLIN 250 MG PO CAPS
500.0000 mg | ORAL_CAPSULE | Freq: Once | ORAL | Status: AC
  Administered 2015-06-19: 500 mg via ORAL
  Filled 2015-06-19: qty 2

## 2015-06-19 MED ORDER — HYDROCODONE-ACETAMINOPHEN 5-325 MG PO TABS: 1.0000 | ORAL_TABLET | ORAL | Status: DC | PRN

## 2015-06-19 MED ORDER — AMOXICILLIN 500 MG PO CAPS: 500.0000 mg | ORAL_CAPSULE | Freq: Three times a day (TID) | ORAL | Status: DC

## 2015-06-19 MED ORDER — HYDROCODONE-ACETAMINOPHEN 5-325 MG PO TABS
1.0000 | ORAL_TABLET | Freq: Once | ORAL | Status: AC
  Administered 2015-06-19: 1 via ORAL
  Filled 2015-06-19: qty 1

## 2015-06-19 NOTE — Discharge Instructions (Signed)
Please use Amoxil 3 times daily with a meal area use Tylenol or ibuprofen for mild pain, use Norco for more severe pain. Norco may cause drowsiness, please use this medication with caution. Please see your dentist as soon as possible to help alleviate this infection. Dental Caries Dental caries is tooth decay. This decay can cause a hole in teeth (cavity) that can get bigger and deeper over time. HOME CARE  Brush and floss your teeth. Do this at least two times a day.  Use a fluoride toothpaste.  Use a mouth rinse if told by your dentist or doctor.  Eat less sugary and starchy foods. Drink less sugary drinks.  Avoid snacking often on sugary and starchy foods. Avoid sipping often on sugary drinks.  Keep regular checkups and cleanings with your dentist.  Use fluoride supplements if told by your dentist or doctor.  Allow fluoride to be applied to teeth if told by your dentist or doctor.   This information is not intended to replace advice given to you by your health care provider. Make sure you discuss any questions you have with your health care provider.   Document Released: 11/16/2007 Document Revised: 02/27/2014 Document Reviewed: 02/09/2012 Elsevier Interactive Patient Education [employer].

## 2015-06-19 NOTE — ED Notes (Signed)
PT c/o right lower dental pain worsening x2 days.

## 2015-06-19 NOTE — ED Provider Notes (Signed)
CSN: [id number]     Arrival date & time 06/19/15  1333 History   First MD Initiated Contact with Patient 06/19/15 1418     Chief Complaint  Patient presents with  . Dental Pain     (Consider location/radiation/quality/duration/timing/severity/associated sxs/prior Treatment) Patient is a 75 y.o. female presenting with tooth pain. The history is provided by the patient.  Dental Pain Location:  Lower Quality:  Aching Severity:  Moderate Onset quality:  Gradual Duration:  2 days Timing:  Intermittent Progression:  Worsening Chronicity:  Chronic Context: dental caries   Context: not trauma   Relieved by:  Nothing Worsened by:  Cold food/drink Associated symptoms: no drooling, no fever and no trismus   Risk factors: lack of dental care   Risk factors: no immunosuppression     Past Medical History  Diagnosis Date  . Hypertension   . Hypercholesteremia   . Anxiety   . Depression   . GERD (gastroesophageal reflux disease)    Past Surgical History  Procedure Laterality Date  . Breast reduction surgery    . Cesarean section    . Hemorrhoid surgery    . Tonsillectomy    . Colonoscopy  08/2005  . Excision of submandibular gland    . Stomach surgery      years ago. ? reason  . Esophagogastroduodenoscopy N/A 08/19/2013    Procedure: ESOPHAGOGASTRODUODENOSCOPY (EGD);  Surgeon: Rogene Houston, MD;  Location: [facility];  Service: Endoscopy;  Laterality: N/A;  11:15 - rescheduled to 7:30 - Ann notified pt   Family History  Problem Relation Age of Onset  . Sleep apnea Son   . Diabetes Son    Social History  Substance Use Topics  . Smoking status: Never Smoker   . Smokeless tobacco: Never Used  . Alcohol Use: No   OB History    Gravida Para Term Preterm AB TAB SAB Ectopic Multiple Living   2         1     Review of Systems  Constitutional: Negative for fever.  HENT: Negative for drooling.   All other systems reviewed and are negative.     Allergies  Morphine  and related and Other  Home Medications   Prior to Admission medications   Medication Sig Start Date End Date Taking? Authorizing Provider  alendronate (FOSAMAX) 70 MG tablet Take 70 mg by mouth once a week. Take with a full glass of water on an empty stomach. 03/17/13   Historical Provider, MD  atenolol (TENORMIN) 50 MG tablet Take 50 mg by mouth daily. 05/15/13   Historical Provider, MD  bismuth-metronidazole-tetracycline (PYLERA) [phone] MG capsule Take 3 capsules by mouth 4 (four) times daily -  before meals and at bedtime. 12/07/14   Rogene Houston, MD  celecoxib (CELEBREX) 100 MG capsule TAKE ONE CAPSULE BY MOUTH DAILY. 05/17/15   Rogene Houston, MD  clobetasol cream (TEMOVATE) [id number] % Apply 1 application topically 3 (three) times daily as needed. 10/19/14   Historical Provider, MD  Cyanocobalamin (VITAMIN B 12 PO) Take 1 tablet by mouth daily.    Historical Provider, MD  diphenhydrAMINE (BENADRYL) 25 MG tablet Take 25 mg by mouth every 6 (six) hours as needed for allergies.    Historical Provider, MD  docusate sodium (COLACE) 100 MG capsule Take 200 mg by mouth at bedtime.    Historical Provider, MD  DULoxetine (CYMBALTA) 60 MG capsule Take 1 capsule by mouth daily. 11/03/14   Historical Provider, MD  esomeprazole (  NEXIUM) 40 MG capsule Take 40 mg by mouth daily at 12 noon.    Historical Provider, MD  Eszopiclone (ESZOPICLONE) 3 MG TABS Take 3 mg by mouth at bedtime. Take immediately before bedtime 09/20/12   Historical Provider, MD  furosemide (LASIX) 40 MG tablet Take 40 mg by mouth daily. 06/17/13   Historical Provider, MD  LORazepam (ATIVAN) 1 MG tablet Take 1 mg by mouth 2 (two) times daily. scheduled 05/21/12   Historical Provider, MD  Multiple Vitamin (MULTIVITAMIN WITH MINERALS) TABS Take 1 tablet by mouth daily.    Historical Provider, MD  omega-3 acid ethyl esters (LOVAZA) 1 G capsule Take 1 g by mouth daily.    Historical Provider, MD  oxyCODONE-acetaminophen (PERCOCET/ROXICET)  5-325 MG per tablet Take 2 tablets by mouth every 4 (four) hours as needed for pain. 04/28/12   Ezequiel Essex, MD  polyethylene glycol-electrolytes (NULYTELY/GOLYTELY) 420 G solution Take 4,000 mLs by mouth once. 12/08/14   Butch Penny, NP  QUEtiapine (SEROQUEL) 25 MG tablet Take 25 mg by mouth at bedtime. 04/17/13   Historical Provider, MD  simvastatin (ZOCOR) 40 MG tablet Take 40 mg by mouth at bedtime. 05/15/13   Historical Provider, MD  tiZANidine (ZANAFLEX) 4 MG tablet Take 4 mg by mouth every 8 (eight) hours as needed for muscle spasms. 12/21/12   Historical Provider, MD  vitamin E (VITAMIN E) 400 UNIT capsule Take 400 Units by mouth daily.    Historical Provider, MD  zolpidem (AMBIEN) 5 MG tablet Take 5 mg by mouth at bedtime as needed for sleep.  05/15/13   Historical Provider, MD   BP 159/75 mmHg  Pulse 75  Temp(Src) 98.2 F (36.8 C) (Oral)  Resp 18  Ht 4\' 11"  (1.499 m)  Wt 52.617 kg  BMI 23.42 kg/m2  SpO2 100% Physical Exam  Constitutional: She is oriented to person, place, and time. She appears well-developed and well-nourished.  Non-toxic appearance.  HENT:  Head: Normocephalic.  Right Ear: Tympanic membrane and external ear normal.  Left Ear: Tympanic membrane and external ear normal.  Multiple dental caries of the lower gum area. No swelling under the tongue. The airway is patent.  Eyes: EOM and lids are normal. Pupils are equal, round, and reactive to light.  Neck: Normal range of motion. Neck supple. Carotid bruit is not present.  Cardiovascular: Normal rate, regular rhythm, normal heart sounds, intact distal pulses and normal pulses.   Pulmonary/Chest: Breath sounds normal. No respiratory distress.  Abdominal: Soft. Bowel sounds are normal. There is no tenderness. There is no guarding.  Musculoskeletal: Normal range of motion.  Lymphadenopathy:       Head (right side): No submandibular adenopathy present.       Head (left side): No submandibular adenopathy present.     She has no cervical adenopathy.  Neurological: She is alert and oriented to person, place, and time. She has normal strength. No cranial nerve deficit or sensory deficit.  Skin: Skin is warm and dry.  Psychiatric: She has a normal mood and affect. Her speech is normal.  Nursing note and vitals reviewed.   ED Course  Procedures (including critical care time) Labs Review Labs Reviewed - No data to display  Imaging Review No results found. I have personally reviewed and evaluated these images and lab results as part of my medical decision-making.   EKG Interpretation None      MDM  There is swelling of the lower gum. There are cavities present. No visible abscess  appreciated. No swelling under the tongue. The airway is patent and the speech is clear.  Patient strongly encouraged to see a dentist as soon as possible. Prescription for Amoxil and Norco given to the patient. Patient is in agreement with this discharge plan.    Final diagnoses:  Dental caries    *I have reviewed nursing notes, vital signs, and all appropriate lab and imaging results for this patient.Lily Kocher, PA-C 06/19/15 1501  Noemi Chapel, MD 06/20/15 1534

## 2015-07-29 DIAGNOSIS — M129 Arthropathy, unspecified: Secondary | ICD-10-CM | POA: Diagnosis not present

## 2015-07-29 DIAGNOSIS — K210 Gastro-esophageal reflux disease with esophagitis: Secondary | ICD-10-CM | POA: Diagnosis not present

## 2015-07-29 DIAGNOSIS — I10 Essential (primary) hypertension: Secondary | ICD-10-CM | POA: Diagnosis not present

## 2015-07-29 DIAGNOSIS — M545 Low back pain: Secondary | ICD-10-CM | POA: Diagnosis not present

## 2015-08-02 DIAGNOSIS — M170 Bilateral primary osteoarthritis of knee: Secondary | ICD-10-CM | POA: Diagnosis not present

## 2015-08-16 ENCOUNTER — Other Ambulatory Visit (INDEPENDENT_AMBULATORY_CARE_PROVIDER_SITE_OTHER): Payer: Self-pay | Admitting: Internal Medicine

## 2015-09-15 ENCOUNTER — Other Ambulatory Visit (INDEPENDENT_AMBULATORY_CARE_PROVIDER_SITE_OTHER): Payer: Self-pay | Admitting: Internal Medicine

## 2015-09-22 ENCOUNTER — Encounter (HOSPITAL_COMMUNITY): Payer: Self-pay | Admitting: Emergency Medicine

## 2015-09-22 ENCOUNTER — Emergency Department (HOSPITAL_COMMUNITY): Payer: Medicare Other

## 2015-09-22 ENCOUNTER — Emergency Department (HOSPITAL_COMMUNITY)
Admission: EM | Admit: 2015-09-22 | Discharge: 2015-09-22 | Disposition: A | Discharge status: Home / Self Care | Payer: Medicare Other | Attending: Emergency Medicine | Admitting: Emergency Medicine

## 2015-09-22 DIAGNOSIS — Y939 Activity, unspecified: Secondary | ICD-10-CM | POA: Diagnosis not present

## 2015-09-22 DIAGNOSIS — R51 Headache: Secondary | ICD-10-CM | POA: Insufficient documentation

## 2015-09-22 DIAGNOSIS — Z79899 Other long term (current) drug therapy: Secondary | ICD-10-CM | POA: Insufficient documentation

## 2015-09-22 DIAGNOSIS — Y999 Unspecified external cause status: Secondary | ICD-10-CM | POA: Insufficient documentation

## 2015-09-22 DIAGNOSIS — S52591A Other fractures of lower end of right radius, initial encounter for closed fracture: Secondary | ICD-10-CM | POA: Diagnosis not present

## 2015-09-22 DIAGNOSIS — W01198A Fall on same level from slipping, tripping and stumbling with subsequent striking against other object, initial encounter: Secondary | ICD-10-CM | POA: Insufficient documentation

## 2015-09-22 DIAGNOSIS — Z792 Long term (current) use of antibiotics: Secondary | ICD-10-CM | POA: Diagnosis not present

## 2015-09-22 DIAGNOSIS — S0990XA Unspecified injury of head, initial encounter: Secondary | ICD-10-CM | POA: Diagnosis not present

## 2015-09-22 DIAGNOSIS — S59291A Other physeal fracture of lower end of radius, right arm, initial encounter for closed fracture: Secondary | ICD-10-CM | POA: Diagnosis not present

## 2015-09-22 DIAGNOSIS — I10 Essential (primary) hypertension: Secondary | ICD-10-CM | POA: Insufficient documentation

## 2015-09-22 DIAGNOSIS — S199XXA Unspecified injury of neck, initial encounter: Secondary | ICD-10-CM | POA: Diagnosis not present

## 2015-09-22 DIAGNOSIS — M25531 Pain in right wrist: Secondary | ICD-10-CM | POA: Diagnosis not present

## 2015-09-22 DIAGNOSIS — S6991XA Unspecified injury of right wrist, hand and finger(s), initial encounter: Secondary | ICD-10-CM | POA: Diagnosis present

## 2015-09-22 DIAGNOSIS — W19XXXA Unspecified fall, initial encounter: Secondary | ICD-10-CM

## 2015-09-22 DIAGNOSIS — Y92010 Kitchen of single-family (private) house as the place of occurrence of the external cause: Secondary | ICD-10-CM | POA: Insufficient documentation

## 2015-09-22 NOTE — ED Notes (Signed)
Patient with no complaints at this time. Respirations even and unlabored. Skin warm/dry. Discharge instructions reviewed with patient at this time. Patient given opportunity to voice concerns/ask questions. Patient discharged at this time and left Emergency Department via wheelchair. 

## 2015-09-22 NOTE — ED Triage Notes (Signed)
Pt family reports pt tripped and fell in the kitchen. Pt family reports landed on right wrist and hit right side of head. Pt denies LOC and reports intermittent lightheadedness since fall. Pt denies being on any blood thinners.

## 2015-09-22 NOTE — Discharge Instructions (Signed)
Take your pain medicine as needed for your wrist pain. Follow-up with Dr. Aline Brochure or your own orthopedic doctor next week

## 2015-09-22 NOTE — ED Provider Notes (Signed)
[facility] DEPT Provider Note   CSN: [id number] Arrival date & time: 09/22/15  1550  First Provider Contact:  None       History   Chief Complaint Chief Complaint  Patient presents with  . Fall    HPI Karlena B Siver is a 75 y.o. female.  The patient states that she tripped and fell hurt her right wrist and hit her head no loss of consciousness   The history is provided by the patient. No language interpreter was used.  Fall  This is a new problem. The problem occurs rarely. The problem has been resolved. Pertinent negatives include no chest pain, no abdominal pain and no headaches. Exacerbated by: Movement of right arm. Nothing relieves the symptoms. She has tried nothing for the symptoms.    Past Medical History:  Diagnosis Date  . Anxiety   . Depression   . GERD (gastroesophageal reflux disease)   . Hypercholesteremia   . Hypertension     Patient Active Problem List   Diagnosis Date Noted  . Common bile duct dilatation 08/13/2013  . Nausea with vomiting 08/13/2013  . Bilateral leg weakness 06/24/2013  . Posture imbalance 06/24/2013  . Chronic radicular low back pain 06/24/2013    Past Surgical History:  Procedure Laterality Date  . BREAST REDUCTION SURGERY    . CESAREAN SECTION    . COLONOSCOPY  08/2005  . ESOPHAGOGASTRODUODENOSCOPY N/A 08/19/2013   Procedure: ESOPHAGOGASTRODUODENOSCOPY (EGD);  Surgeon: Rogene Houston, MD;  Location: [facility];  Service: Endoscopy;  Laterality: N/A;  11:15 - rescheduled to 7:30 - Ann notified pt  . excision of submandibular gland    . HEMORRHOID SURGERY    . STOMACH SURGERY     years ago. ? reason  . TONSILLECTOMY      OB History    Gravida Para Term Preterm AB Living   2         1   SAB TAB Ectopic Multiple Live Births                   Home Medications    Prior to Admission medications   Medication Sig Start Date End Date Taking? Authorizing Provider  alendronate (FOSAMAX) 70 MG tablet Take 70 mg by  mouth once a week. Take with a full glass of water on an empty stomach. 03/17/13   Historical Provider, MD  amoxicillin (AMOXIL) 500 MG capsule Take 1 capsule (500 mg total) by mouth 3 (three) times daily. 06/19/15   Lily Kocher, PA-C  atenolol (TENORMIN) 50 MG tablet Take 50 mg by mouth daily. 05/15/13   Historical Provider, MD  celecoxib (CELEBREX) 100 MG capsule TAKE ONE CAPSULE BY MOUTH DAILY. 05/17/15   Rogene Houston, MD  Cholecalciferol (VITAMIN D3) 2000 units TABS Take 1 tablet by mouth daily.    Historical Provider, MD  clobetasol cream (TEMOVATE) 0.05 %  09/06/15   Historical Provider, MD  Cyanocobalamin (VITAMIN B 12 PO) Take 1 tablet by mouth daily.    Historical Provider, MD  diphenhydrAMINE (BENADRYL) 25 MG tablet Take 25 mg by mouth every 6 (six) hours as needed for allergies.    Historical Provider, MD  esomeprazole (NEXIUM) 40 MG capsule Take 40 mg by mouth daily at 12 noon.    Historical Provider, MD  furosemide (LASIX) 40 MG tablet Take 40 mg by mouth daily as needed for fluid.  06/17/13   Historical Provider, MD  HYDROcodone-acetaminophen (NORCO/VICODIN) 5-325 MG tablet Take 1 tablet by  mouth every 4 (four) hours as needed. 06/19/15   Lily Kocher, PA-C  LORazepam (ATIVAN) 1 MG tablet Take 1 mg by mouth 2 (two) times daily. scheduled 05/21/12   Historical Provider, MD  Multiple Vitamin (MULTIVITAMIN WITH MINERALS) TABS Take 1 tablet by mouth daily.    Historical Provider, MD  omega-3 acid ethyl esters (LOVAZA) 1 G capsule Take 1 g by mouth daily.    Historical Provider, MD  oxyCODONE-acetaminophen (PERCOCET/ROXICET) 5-325 MG per tablet Take 2 tablets by mouth every 4 (four) hours as needed for pain. 04/28/12   Ezequiel Essex, MD  QUEtiapine (SEROQUEL) 25 MG tablet Take 25 mg by mouth at bedtime. 04/17/13   Historical Provider, MD  simvastatin (ZOCOR) 40 MG tablet Take 40 mg by mouth at bedtime. 05/15/13   Historical Provider, MD  tiZANidine (ZANAFLEX) 4 MG tablet Take 4 mg by mouth every  8 (eight) hours as needed for muscle spasms. 12/21/12   Historical Provider, MD  vitamin E (VITAMIN E) 400 UNIT capsule Take 400 Units by mouth daily.    Historical Provider, MD  zolpidem (AMBIEN) 10 MG tablet Take 10 mg by mouth at bedtime as needed for sleep.    Historical Provider, MD    Family History Family History  Problem Relation Age of Onset  . Sleep apnea Son   . Diabetes Son     Social History Social History  Substance Use Topics  . Smoking status: Never Smoker  . Smokeless tobacco: Never Used  . Alcohol use No     Allergies   Morphine and related and Other   Review of Systems Review of Systems  Constitutional: Negative for appetite change and fatigue.  HENT: Negative for congestion, ear discharge and sinus pressure.   Eyes: Negative for discharge.  Respiratory: Negative for cough.   Cardiovascular: Negative for chest pain.  Gastrointestinal: Negative for abdominal pain and diarrhea.  Genitourinary: Negative for frequency and hematuria.  Musculoskeletal: Negative for back pain.       Right wrist pain  Skin: Negative for rash.  Neurological: Negative for seizures and headaches.  Psychiatric/Behavioral: Negative for hallucinations.     Physical Exam Updated Vital Signs BP 126/73 (BP Location: Left Arm)   Pulse 70   Temp 98 F (36.7 C) (Oral)   Resp 16   Ht 4\' 11"  (1.499 m)   Wt 112 lb (50.8 kg)   SpO2 100%   BMI 22.62 kg/m   Physical Exam  Constitutional: She is oriented to person, place, and time. She appears well-developed.  HENT:  Head: Normocephalic.  Eyes: Conjunctivae and EOM are normal. No scleral icterus.  Neck: Neck supple. No thyromegaly present.  Cardiovascular: Normal rate and regular rhythm.  Exam reveals no gallop and no friction rub.   No murmur heard. Pulmonary/Chest: No stridor. She has no wheezes. She has no rales. She exhibits no tenderness.  Abdominal: She exhibits no distension. There is no tenderness. There is no rebound.    Musculoskeletal: She exhibits no edema.  Tender right wrist. Neurovascular exam normal  Lymphadenopathy:    She has no cervical adenopathy.  Neurological: She is oriented to person, place, and time. She exhibits normal muscle tone. Coordination normal.  Skin: No rash noted. No erythema.  Psychiatric: She has a normal mood and affect. Her behavior is normal.     ED Treatments / Results  Labs (all labs ordered are listed, but only abnormal results are displayed) Labs Reviewed - No data to display  EKG  EKG Interpretation None       Radiology Dg Wrist Complete Right  Result Date: 09/22/2015 CLINICAL DATA:  Pain following fall EXAM: RIGHT WRIST - COMPLETE 3+ VIEW COMPARISON:  None. FINDINGS: Frontal, oblique lateral, and ulnar deviation scaphoid images were obtained. There is a nondisplaced fracture of the distal radial metaphysis. There is a subtle lucency in the proximal aspect of the scaphoid which may represent a nutrient foramen. An incomplete fracture in this area, however, cannot be excluded. No other evidence of fractures. No dislocations. There is extensive osteoarthritic change in the first carpal -metacarpal joint. There is somewhat milder osteoarthritic change in the scaphotrapezial joint. There is also osteoarthritic change in all MCP joints and first IP joint. No erosive changes. IMPRESSION: Nondisplaced fracture distal radial metaphysis. Question fracture proximal scaphoid versus nutrient foramen. This finding may warrant immobilization with reimaging in 7-10 days. No other areas concerning for potential fracture. No dislocation. Osteoarthritic change, most marked laterally, in particular in the first carpal -metacarpal joint. Electronically Signed   By: Lowella Grip III M.D.   On: 09/22/2015 16:55   Ct Head Wo Contrast  Result Date: 09/22/2015 CLINICAL DATA:  Tripped and fell in kitchen. Fell on right wrist and right side of head. No loss of consciousness. The patient is  now lightheaded. EXAM: CT HEAD WITHOUT CONTRAST CT CERVICAL SPINE WITHOUT CONTRAST TECHNIQUE: Multidetector CT imaging of the head and cervical spine was performed following the standard protocol without intravenous contrast. Multiplanar CT image reconstructions of the cervical spine were also generated. COMPARISON:  CT head and cervical spine 04/28/2012. MRI of the cervical spine 04/20/2009 FINDINGS: CT HEAD FINDINGS No acute infarct, hemorrhage, or mass lesion is present. The ventricles are of normal size. No significant extraaxial fluid collection is present. The calvarium is intact. No significant extracranial soft tissue injury is evident. The globes and orbits are intact. The paranasal sinuses and mastoid air cells are clear. Atherosclerotic calcifications are present at the cavernous internal carotid arteries without significant interval change. CT CERVICAL SPINE FINDINGS Cervical spine is imaged from the skullbase through T2-3. There straightening of the normal cervical lordosis. Slight anterolisthesis at C3-4 is stable. Chronic endplate changes are again seen C3-4 through C6-7. There is slight anterolisthesis at C7-T1. Chronic sclerotic endplate changes are noted at T2-3. This area was not previously imaged by CT, but was normal by a MRI in 2011. IMPRESSION: 1. Normal CT appearance of the brain for age. 2. No evidence for acute trauma to the head. 3. Atherosclerosis. 4. Multilevel degenerative changes in the cervical spine are stable. 5. Endplate sclerotic changes at T2-3 with chronic loss of height compatible with progressive degenerative change in the upper thoracic spine. Electronically Signed   By: San Morelle M.D.   On: 09/22/2015 17:29   Ct Cervical Spine Wo Contrast  Result Date: 09/22/2015 CLINICAL DATA:  Tripped and fell in kitchen. Fell on right wrist and right side of head. No loss of consciousness. The patient is now lightheaded. EXAM: CT HEAD WITHOUT CONTRAST CT CERVICAL SPINE  WITHOUT CONTRAST TECHNIQUE: Multidetector CT imaging of the head and cervical spine was performed following the standard protocol without intravenous contrast. Multiplanar CT image reconstructions of the cervical spine were also generated. COMPARISON:  CT head and cervical spine 04/28/2012. MRI of the cervical spine 04/20/2009 FINDINGS: CT HEAD FINDINGS No acute infarct, hemorrhage, or mass lesion is present. The ventricles are of normal size. No significant extraaxial fluid collection is present. The calvarium is intact. No significant  extracranial soft tissue injury is evident. The globes and orbits are intact. The paranasal sinuses and mastoid air cells are clear. Atherosclerotic calcifications are present at the cavernous internal carotid arteries without significant interval change. CT CERVICAL SPINE FINDINGS Cervical spine is imaged from the skullbase through T2-3. There straightening of the normal cervical lordosis. Slight anterolisthesis at C3-4 is stable. Chronic endplate changes are again seen C3-4 through C6-7. There is slight anterolisthesis at C7-T1. Chronic sclerotic endplate changes are noted at T2-3. This area was not previously imaged by CT, but was normal by a MRI in 2011. IMPRESSION: 1. Normal CT appearance of the brain for age. 2. No evidence for acute trauma to the head. 3. Atherosclerosis. 4. Multilevel degenerative changes in the cervical spine are stable. 5. Endplate sclerotic changes at T2-3 with chronic loss of height compatible with progressive degenerative change in the upper thoracic spine. Electronically Signed   By: San Morelle M.D.   On: 09/22/2015 17:29    Procedures Procedures (including critical care time)  Medications Ordered in ED Medications - No data to display   Initial Impression / Assessment and Plan / ED Course  I have reviewed the triage vital signs and the nursing notes.  Pertinent labs & imaging results that were available during my care of the  patient were reviewed by me and considered in my medical decision making (see chart for details).  Clinical Course    Patient with a fall and head injury. CT scan of head negative. Wrist x-ray shows nondisplaced fracture. Patient has wrist immobilized with Velcro wrist splint. She will take her own pain medicine and follow-up with either Dr. Aline Brochure or her her own orthopedic doctor Final Clinical Impressions(s) / ED Diagnoses   Final diagnoses:  Fall, initial encounter    New Prescriptions New Prescriptions   No medications on file     Milton Ferguson, MD 09/22/15 [phone]

## 2015-10-07 ENCOUNTER — Other Ambulatory Visit (INDEPENDENT_AMBULATORY_CARE_PROVIDER_SITE_OTHER): Payer: Self-pay | Admitting: Internal Medicine

## 2015-10-08 ENCOUNTER — Other Ambulatory Visit (INDEPENDENT_AMBULATORY_CARE_PROVIDER_SITE_OTHER): Payer: Self-pay | Admitting: Internal Medicine

## 2015-10-11 ENCOUNTER — Other Ambulatory Visit (INDEPENDENT_AMBULATORY_CARE_PROVIDER_SITE_OTHER): Payer: Self-pay | Admitting: Internal Medicine

## 2015-10-14 ENCOUNTER — Encounter: Payer: Self-pay | Admitting: Orthopaedic Surgery

## 2015-10-14 ENCOUNTER — Ambulatory Visit (INDEPENDENT_AMBULATORY_CARE_PROVIDER_SITE_OTHER): Payer: Medicare Other

## 2015-10-14 ENCOUNTER — Ambulatory Visit (INDEPENDENT_AMBULATORY_CARE_PROVIDER_SITE_OTHER): Payer: Medicare Other | Admitting: Orthopaedic Surgery

## 2015-10-14 VITALS — BP 127/70 | HR 68 | Temp 97.5°F | Ht <= 58 in | Wt 110.0 lb

## 2015-10-14 DIAGNOSIS — M25531 Pain in right wrist: Secondary | ICD-10-CM

## 2015-10-14 DIAGNOSIS — S52501A Unspecified fracture of the lower end of right radius, initial encounter for closed fracture: Secondary | ICD-10-CM

## 2015-10-14 NOTE — Progress Notes (Signed)
Subjective: I broke my wrist    Patient ID: [name] [name], female    DOB: [birth year], 75 y.o.   MRN: [id number]  HPI She fell on September 22, 2015 and hurt her right wrist.  She was seen in the ER.  X-rays showed: IMPRESSION: Nondisplaced fracture distal radial metaphysis. Question fracture proximal scaphoid versus nutrient foramen. This finding may warrant immobilization with reimaging in 7-10 days. No other areas concerning for potential fracture. No dislocation. Osteoarthritic change, most marked laterally, in particular in the first carpal -metacarpal joint.  She was placed in a cock-up splint.  She had no other injury.  I have reviewed the ER records, the x-rays and the x-ray report.  She has some pain of the right wrist still.   Review of Systems  HENT: Negative for congestion.   Respiratory: Negative for cough and shortness of breath.   Cardiovascular: Negative for chest pain and leg swelling.  Endocrine: Positive for cold intolerance.  Musculoskeletal: Positive for arthralgias.  Allergic/Immunologic: Positive for environmental allergies.  Psychiatric/Behavioral: The patient is nervous/anxious.    Past Medical History:  Diagnosis Date  . Anxiety   . Depression   . GERD (gastroesophageal reflux disease)   . Hypercholesteremia   . Hypertension     Past Surgical History:  Procedure Laterality Date  . BREAST REDUCTION SURGERY    . CESAREAN SECTION    . COLONOSCOPY  08/2005  . ESOPHAGOGASTRODUODENOSCOPY N/A 08/19/2013   Procedure: ESOPHAGOGASTRODUODENOSCOPY (EGD);  Surgeon: Rogene Houston, MD;  Location: [facility];  Service: Endoscopy;  Laterality: N/A;  11:15 - rescheduled to 7:30 - Ann notified pt  . excision of submandibular gland    . HEMORRHOID SURGERY    . STOMACH SURGERY     years ago. ? reason  . TONSILLECTOMY      Current Outpatient Prescriptions on File Prior to Visit  Medication Sig Dispense Refill  . alendronate (FOSAMAX) 70 MG tablet Take 70 mg by  mouth once a week. Take with a full glass of water on an empty stomach.    Marland Kitchen amoxicillin (AMOXIL) 500 MG capsule Take 1 capsule (500 mg total) by mouth 3 (three) times daily. 21 capsule 0  . atenolol (TENORMIN) 50 MG tablet Take 50 mg by mouth daily.    . celecoxib (CELEBREX) 100 MG capsule TAKE ONE CAPSULE BY MOUTH DAILY. 30 capsule 2  . Cholecalciferol (VITAMIN D3) 2000 units TABS Take 1 tablet by mouth daily.    . clobetasol cream (TEMOVATE) 0.05 %     . Cyanocobalamin (VITAMIN B 12 PO) Take 1 tablet by mouth daily.    . diphenhydrAMINE (BENADRYL) 25 MG tablet Take 25 mg by mouth every 6 (six) hours as needed for allergies.    Marland Kitchen esomeprazole (NEXIUM) 40 MG capsule Take 40 mg by mouth daily at 12 noon.    . furosemide (LASIX) 40 MG tablet Take 40 mg by mouth daily as needed for fluid.     Marland Kitchen HYDROcodone-acetaminophen (NORCO/VICODIN) 5-325 MG tablet Take 1 tablet by mouth every 4 (four) hours as needed. 15 tablet 0  . LORazepam (ATIVAN) 1 MG tablet Take 1 mg by mouth 2 (two) times daily. scheduled    . Multiple Vitamin (MULTIVITAMIN WITH MINERALS) TABS Take 1 tablet by mouth daily.    Marland Kitchen omega-3 acid ethyl esters (LOVAZA) 1 G capsule Take 1 g by mouth daily.    Marland Kitchen oxyCODONE-acetaminophen (PERCOCET/ROXICET) 5-325 MG per tablet Take 2 tablets by mouth every 4 (  four) hours as needed for pain. 15 tablet 0  . QUEtiapine (SEROQUEL) 25 MG tablet Take 25 mg by mouth at bedtime.    . simvastatin (ZOCOR) 40 MG tablet Take 40 mg by mouth at bedtime.    Marland Kitchen tiZANidine (ZANAFLEX) 4 MG tablet Take 4 mg by mouth every 8 (eight) hours as needed for muscle spasms.    . vitamin E (VITAMIN E) 400 UNIT capsule Take 400 Units by mouth daily.    Marland Kitchen zolpidem (AMBIEN) 10 MG tablet Take 10 mg by mouth at bedtime as needed for sleep.     No current facility-administered medications on file prior to visit.     Social History   Social History  . Marital status: Widowed    Spouse name: N/A  . Number of children: N/A  .  Years of education: N/A   Occupational History  . Not on file.   Social History Main Topics  . Smoking status: Never Smoker  . Smokeless tobacco: Never Used  . Alcohol use No  . Drug use: No  . Sexual activity: Yes    Birth control/ protection: Post-menopausal   Other Topics Concern  . Not on file   Social History Narrative  . No narrative on file    Family History  Problem Relation Age of Onset  . Sleep apnea Son   . Diabetes Son     BP 127/70   Pulse 68   Temp 97.5 F (36.4 C)   Ht 4\' 9"  (1.448 m)   Wt 110 lb (49.9 kg)   BMI 23.80 kg/m      Objective:   Physical Exam  Constitutional: She is oriented to person, place, and time. She appears well-developed and well-nourished.  HENT:  Head: Normocephalic and atraumatic.  Eyes: Conjunctivae and EOM are normal. Pupils are equal, round, and reactive to light.  Neck: Normal range of motion. Neck supple.  Cardiovascular: Normal rate, regular rhythm and intact distal pulses.   Pulmonary/Chest: Effort normal.  Abdominal: Soft.  Musculoskeletal: She exhibits tenderness (Pain of the right wrist with decrease motion, no swelling, no redness, NV intact.  Left wrist negative.).  Neurological: She is alert and oriented to person, place, and time. She displays normal reflexes. No cranial nerve deficit. She exhibits normal muscle tone. Coordination normal.  Skin: Skin is warm and dry.  Psychiatric: She has a normal mood and affect. Her behavior is normal. Judgment and thought content normal.   X-rays were done of the right wrist reported separately.      Assessment & Plan:   Encounter Diagnoses  Name Primary?  . Right wrist pain Yes  . Fracture of right distal radius, closed, initial encounter    She was placed in a short arm fiberglass cast.  Return in three weeks.  X-rays then out of cast.  Call if any problem.  Precautions discussed.  Electronically Signed Sanjuana Kava, MD 8/24/20178:43 PM

## 2015-11-04 ENCOUNTER — Ambulatory Visit (INDEPENDENT_AMBULATORY_CARE_PROVIDER_SITE_OTHER): Payer: Medicare Other | Admitting: Orthopaedic Surgery

## 2015-11-04 ENCOUNTER — Encounter: Payer: Self-pay | Admitting: Orthopaedic Surgery

## 2015-11-04 ENCOUNTER — Ambulatory Visit (INDEPENDENT_AMBULATORY_CARE_PROVIDER_SITE_OTHER): Payer: Medicare Other

## 2015-11-04 DIAGNOSIS — S62101D Fracture of unspecified carpal bone, right wrist, subsequent encounter for fracture with routine healing: Secondary | ICD-10-CM

## 2015-11-04 NOTE — Progress Notes (Signed)
CC:  My wrist is sore a little  She had the cast removed today from the right arm.  She has no skin breakdown.  She has no new trauma.  NV intact.  ROM is good first time out of the cast.  X-rays were done, reported separately.  Encounter Diagnosis  Name Primary?  . Wrist fracture, right, with routine healing, subsequent encounter Yes   Return in two weeks.  X-rays on return.  Cock-up splint given with instructions for use.  Call if any problem.  Precautions discussed.  Electronically Signed Sanjuana Kava, MD 9/14/20172:56 PM

## 2015-11-10 ENCOUNTER — Other Ambulatory Visit (HOSPITAL_COMMUNITY): Payer: Self-pay | Admitting: Pulmonary Disease

## 2015-11-10 DIAGNOSIS — M545 Low back pain: Secondary | ICD-10-CM | POA: Diagnosis not present

## 2015-11-10 DIAGNOSIS — I10 Essential (primary) hypertension: Secondary | ICD-10-CM | POA: Diagnosis not present

## 2015-11-10 DIAGNOSIS — Z79891 Long term (current) use of opiate analgesic: Secondary | ICD-10-CM | POA: Diagnosis not present

## 2015-11-10 DIAGNOSIS — Z1231 Encounter for screening mammogram for malignant neoplasm of breast: Secondary | ICD-10-CM

## 2015-11-10 DIAGNOSIS — K210 Gastro-esophageal reflux disease with esophagitis: Secondary | ICD-10-CM | POA: Diagnosis not present

## 2015-11-10 DIAGNOSIS — Z780 Asymptomatic menopausal state: Secondary | ICD-10-CM

## 2015-11-10 DIAGNOSIS — Z23 Encounter for immunization: Secondary | ICD-10-CM | POA: Diagnosis not present

## 2015-11-10 DIAGNOSIS — S52501D Unspecified fracture of the lower end of right radius, subsequent encounter for closed fracture with routine healing: Secondary | ICD-10-CM | POA: Diagnosis not present

## 2015-11-15 ENCOUNTER — Ambulatory Visit (HOSPITAL_COMMUNITY)
Admission: RE | Admit: 2015-11-15 | Discharge: 2015-11-15 | Disposition: A | Discharge status: Home / Self Care | Payer: Medicare Other | Source: Ambulatory Visit | Attending: Pulmonary Disease | Admitting: Pulmonary Disease

## 2015-11-15 ENCOUNTER — Other Ambulatory Visit (HOSPITAL_COMMUNITY): Payer: Self-pay | Admitting: Pulmonary Disease

## 2015-11-15 DIAGNOSIS — Z1231 Encounter for screening mammogram for malignant neoplasm of breast: Secondary | ICD-10-CM | POA: Diagnosis not present

## 2015-11-15 DIAGNOSIS — Z780 Asymptomatic menopausal state: Secondary | ICD-10-CM

## 2015-11-15 DIAGNOSIS — Z13820 Encounter for screening for osteoporosis: Secondary | ICD-10-CM | POA: Insufficient documentation

## 2015-11-15 DIAGNOSIS — M810 Age-related osteoporosis without current pathological fracture: Secondary | ICD-10-CM | POA: Insufficient documentation

## 2015-11-17 ENCOUNTER — Other Ambulatory Visit: Payer: Self-pay | Admitting: Pulmonary Disease

## 2015-11-17 DIAGNOSIS — R928 Other abnormal and inconclusive findings on diagnostic imaging of breast: Secondary | ICD-10-CM

## 2015-11-18 ENCOUNTER — Ambulatory Visit (INDEPENDENT_AMBULATORY_CARE_PROVIDER_SITE_OTHER): Payer: Medicare Other

## 2015-11-18 ENCOUNTER — Ambulatory Visit (INDEPENDENT_AMBULATORY_CARE_PROVIDER_SITE_OTHER): Payer: Medicare Other | Admitting: Orthopaedic Surgery

## 2015-11-18 DIAGNOSIS — S62101D Fracture of unspecified carpal bone, right wrist, subsequent encounter for fracture with routine healing: Secondary | ICD-10-CM

## 2015-11-18 NOTE — Progress Notes (Signed)
CC:  My wrist is just a little sore  She has been using the cock-up splint on the right.  She has no new trauma.  NV intact. ROM is good.  X-rays were done of the right wrist, reported separately.  Encounter Diagnosis  Name Primary?  . Wrist fracture, right, with routine healing, subsequent encounter Yes   Continue coming out of the splint more and more.  Return in three weeks.  X-rays of the right wrist on return.  Call if any problem  Precautions discussed.  Electronically Signed Sanjuana Kava, MD 9/28/20173:14 PM

## 2015-12-09 ENCOUNTER — Ambulatory Visit (INDEPENDENT_AMBULATORY_CARE_PROVIDER_SITE_OTHER): Payer: Medicare Other | Admitting: Orthopaedic Surgery

## 2015-12-09 ENCOUNTER — Ambulatory Visit (INDEPENDENT_AMBULATORY_CARE_PROVIDER_SITE_OTHER): Payer: Medicare Other

## 2015-12-09 ENCOUNTER — Encounter: Payer: Self-pay | Admitting: Orthopaedic Surgery

## 2015-12-09 VITALS — BP 126/74 | HR 65 | Temp 97.0°F | Ht <= 58 in | Wt 109.0 lb

## 2015-12-09 DIAGNOSIS — G5601 Carpal tunnel syndrome, right upper limb: Secondary | ICD-10-CM | POA: Diagnosis not present

## 2015-12-09 DIAGNOSIS — M25562 Pain in left knee: Secondary | ICD-10-CM

## 2015-12-09 DIAGNOSIS — S62101D Fracture of unspecified carpal bone, right wrist, subsequent encounter for fracture with routine healing: Secondary | ICD-10-CM | POA: Diagnosis not present

## 2015-12-09 DIAGNOSIS — G8929 Other chronic pain: Secondary | ICD-10-CM

## 2015-12-09 DIAGNOSIS — M25561 Pain in right knee: Secondary | ICD-10-CM | POA: Diagnosis not present

## 2015-12-09 NOTE — Progress Notes (Signed)
Patient ID:[name] [name], female DOB:06-Jul-1940, 75 y.o. PF:[id number]  Chief Complaint  Patient presents with  . Follow-up    right wrist fracture    HPI  [name] [name] is a 75 y.o. female who has a healing fracture of the right distal radius.  She is doing well with the wrist but has developed early signs of carpal tunnel on the right with nocturnal numbness and decreased sensation of the median nerve.  I told her to watch it this coming month and it should gradually improve.  If not, then EMG can be done.  She can wear her cock-up splint at night.  The nerve was most likely contused during her fracture injury.  She also relates pain in both knee with swelling and popping.  Her family doctor has suggested Synvisc and she asked about this.  I told her I could do this.  She does not want any surgery on the knees at this time.  I have given Synvisc Rx.  She is talking about doing the left knee first.  I told her it would be once a week for three weeks.  HPI  Body mass index is 23.59 kg/m.  ROS  Review of Systems  HENT: Negative for congestion.   Respiratory: Negative for cough and shortness of breath.   Cardiovascular: Negative for chest pain and leg swelling.  Endocrine: Positive for cold intolerance.  Musculoskeletal: Positive for arthralgias.  Allergic/Immunologic: Positive for environmental allergies.  Psychiatric/Behavioral: The patient is nervous/anxious.     Past Medical History:  Diagnosis Date  . Anxiety   . Depression   . GERD (gastroesophageal reflux disease)   . Hypercholesteremia   . Hypertension     Past Surgical History:  Procedure Laterality Date  . BREAST REDUCTION SURGERY    . CESAREAN SECTION    . COLONOSCOPY  08/2005  . ESOPHAGOGASTRODUODENOSCOPY N/A 08/19/2013   Procedure: ESOPHAGOGASTRODUODENOSCOPY (EGD);  Surgeon: Rogene Houston, MD;  Location: [facility];  Service: Endoscopy;  Laterality: N/A;  11:15 - rescheduled to 7:30 - Ann notified pt  .  excision of submandibular gland    . HEMORRHOID SURGERY    . STOMACH SURGERY     years ago. ? reason  . TONSILLECTOMY      Family History  Problem Relation Age of Onset  . Sleep apnea Son   . Diabetes Son     Social History Social History  Substance Use Topics  . Smoking status: Never Smoker  . Smokeless tobacco: Never Used  . Alcohol use No    Allergies  Allergen Reactions  . Morphine And Related Other (See Comments)    Patient stated that she thinks that morphine was one of the medication that she had a bad reaction to. She can not remember the other. She said that she blacked out when she took it.  . Other     Allergic to 2 meds, does not know what they are.    Current Outpatient Prescriptions  Medication Sig Dispense Refill  . alendronate (FOSAMAX) 70 MG tablet Take 70 mg by mouth once a week. Take with a full glass of water on an empty stomach.    Marland Kitchen amoxicillin (AMOXIL) 500 MG capsule Take 1 capsule (500 mg total) by mouth 3 (three) times daily. 21 capsule 0  . atenolol (TENORMIN) 50 MG tablet Take 50 mg by mouth daily.    . celecoxib (CELEBREX) 100 MG capsule TAKE ONE CAPSULE BY MOUTH DAILY. 30 capsule 2  .  Cholecalciferol (VITAMIN D3) 2000 units TABS Take 1 tablet by mouth daily.    . clobetasol cream (TEMOVATE) 0.05 %     . Cyanocobalamin (VITAMIN B 12 PO) Take 1 tablet by mouth daily.    . diphenhydrAMINE (BENADRYL) 25 MG tablet Take 25 mg by mouth every 6 (six) hours as needed for allergies.    Marland Kitchen esomeprazole (NEXIUM) 40 MG capsule Take 40 mg by mouth daily at 12 noon.    . furosemide (LASIX) 40 MG tablet Take 40 mg by mouth daily as needed for fluid.     Marland Kitchen HYDROcodone-acetaminophen (NORCO/VICODIN) 5-325 MG tablet Take 1 tablet by mouth every 4 (four) hours as needed. 15 tablet 0  . LORazepam (ATIVAN) 1 MG tablet Take 1 mg by mouth 2 (two) times daily. scheduled    . Multiple Vitamin (MULTIVITAMIN WITH MINERALS) TABS Take 1 tablet by mouth daily.    Marland Kitchen omega-3  acid ethyl esters (LOVAZA) 1 G capsule Take 1 g by mouth daily.    Marland Kitchen oxyCODONE-acetaminophen (PERCOCET/ROXICET) 5-325 MG per tablet Take 2 tablets by mouth every 4 (four) hours as needed for pain. 15 tablet 0  . QUEtiapine (SEROQUEL) 25 MG tablet Take 25 mg by mouth at bedtime.    . simvastatin (ZOCOR) 40 MG tablet Take 40 mg by mouth at bedtime.    Marland Kitchen tiZANidine (ZANAFLEX) 4 MG tablet Take 4 mg by mouth every 8 (eight) hours as needed for muscle spasms.    . vitamin E (VITAMIN E) 400 UNIT capsule Take 400 Units by mouth daily.    Marland Kitchen zolpidem (AMBIEN) 10 MG tablet Take 10 mg by mouth at bedtime as needed for sleep.     No current facility-administered medications for this visit.      Physical Exam  Blood pressure 126/74, pulse 65, temperature 97 F (36.1 C), height 4\' 9"  (1.448 m), weight 109 lb (49.4 kg).  Constitutional: overall normal hygiene, normal nutrition, well developed, normal grooming, normal body habitus. Assistive device:cock-up splint right  Musculoskeletal: gait and station Limp left, muscle tone and strength are normal, no tremors or atrophy is present.  .  Neurological: coordination overall normal.  Deep tendon reflex/nerve stretch intact.  Sensation normal.  Cranial nerves II-XII intact.   Skin:   Normal overall no scars, lesions, ulcers or rashes. No psoriasis.  Psychiatric: Alert and oriented x 3.  Recent memory intact, remote memory unclear.  Normal mood and affect. Well groomed.  Good eye contact.  Cardiovascular: overall no swelling, no varicosities, no edema bilaterally, normal temperatures of the legs and arms, no clubbing, cyanosis and good capillary refill.  Lymphatic: palpation is normal.  She has some shortening of the distal radius but good ROM.  She has weakly positive Phalen and negative Tinel of the right wrist.  She has decreased sensation of the right median nerve.  Left is normal.  Grips are good.  The bilateral lower extremity is  examined:  Inspection:  Thigh:  Non-tender and no defects  Knee has swelling 1+ effusion.                        Joint tenderness is present                        Patient is tender over the medial joint line  Lower Leg:  Has normal appearance and no tenderness or defects  Ankle:  Non-tender and no defects  Foot:  Non-tender and no defects Range of Motion:  Knee:  Range of motion is: 0-105 right, 0-100 left                        Crepitus is  present  Ankle:  Range of motion is normal. Strength and Tone:  The bilateral lower extremity has normal strength and tone. Stability:  Knee:  The knee is stable.  Ankle:  The ankle is stable.    The patient has been educated about the nature of the problem(s) and counseled on treatment options.  The patient appeared to understand what I have discussed and is in agreement with it.  Encounter Diagnoses  Name Primary?  . Closed fracture of right wrist with routine healing, subsequent encounter Yes  . Carpal tunnel syndrome on right   . Chronic pain of left knee   . Chronic pain of right knee     PLAN Call if any problems.  Precautions discussed.  Continue current medications.   Return to clinic after gets Synvisc Rx filled.   Electronically Signed Sanjuana Kava, MD 10/19/20173:37 PM

## 2015-12-17 ENCOUNTER — Other Ambulatory Visit: Payer: Medicare Other

## 2016-01-04 ENCOUNTER — Ambulatory Visit
Admission: RE | Admit: 2016-01-04 | Discharge: 2016-01-04 | Disposition: A | Discharge status: Home / Self Care | Payer: Medicare Other | Source: Ambulatory Visit | Attending: Pulmonary Disease | Admitting: Pulmonary Disease

## 2016-01-04 DIAGNOSIS — R928 Other abnormal and inconclusive findings on diagnostic imaging of breast: Secondary | ICD-10-CM

## 2016-05-09 DIAGNOSIS — M545 Low back pain: Secondary | ICD-10-CM | POA: Diagnosis not present

## 2016-05-09 DIAGNOSIS — K210 Gastro-esophageal reflux disease with esophagitis: Secondary | ICD-10-CM | POA: Diagnosis not present

## 2016-05-09 DIAGNOSIS — G5601 Carpal tunnel syndrome, right upper limb: Secondary | ICD-10-CM | POA: Diagnosis not present

## 2016-05-09 DIAGNOSIS — I10 Essential (primary) hypertension: Secondary | ICD-10-CM | POA: Diagnosis not present

## 2016-05-11 ENCOUNTER — Other Ambulatory Visit (HOSPITAL_COMMUNITY): Payer: Self-pay | Admitting: Pulmonary Disease

## 2016-05-11 DIAGNOSIS — R109 Unspecified abdominal pain: Secondary | ICD-10-CM

## 2016-05-15 ENCOUNTER — Ambulatory Visit (HOSPITAL_COMMUNITY)
Admission: RE | Admit: 2016-05-15 | Discharge: 2016-05-15 | Disposition: A | Discharge status: Home / Self Care | Payer: Medicare Other | Source: Ambulatory Visit | Attending: Pulmonary Disease | Admitting: Pulmonary Disease

## 2016-05-15 DIAGNOSIS — R109 Unspecified abdominal pain: Secondary | ICD-10-CM | POA: Diagnosis not present

## 2016-05-15 DIAGNOSIS — R112 Nausea with vomiting, unspecified: Secondary | ICD-10-CM | POA: Diagnosis not present

## 2016-05-15 DIAGNOSIS — K838 Other specified diseases of biliary tract: Secondary | ICD-10-CM | POA: Insufficient documentation

## 2016-05-24 ENCOUNTER — Telehealth: Payer: Self-pay | Admitting: Orthopaedic Surgery

## 2016-05-24 NOTE — Telephone Encounter (Signed)
Patient called to ask about copies of Xray films for appointment she has scheduled with hand specialist Dr Burney Gauze 06/06/16. Advised to contact [facility] radiology department; I gave her the direct contact phone number.

## 2016-05-30 ENCOUNTER — Other Ambulatory Visit (HOSPITAL_COMMUNITY): Payer: Self-pay | Admitting: Pulmonary Disease

## 2016-05-30 DIAGNOSIS — R109 Unspecified abdominal pain: Secondary | ICD-10-CM

## 2016-05-30 DIAGNOSIS — R112 Nausea with vomiting, unspecified: Secondary | ICD-10-CM

## 2016-06-05 ENCOUNTER — Ambulatory Visit (HOSPITAL_COMMUNITY)
Admission: RE | Admit: 2016-06-05 | Discharge: 2016-06-05 | Disposition: A | Discharge status: Home / Self Care | Payer: Medicare Other | Source: Ambulatory Visit | Attending: Pulmonary Disease | Admitting: Pulmonary Disease

## 2016-06-05 ENCOUNTER — Other Ambulatory Visit (HOSPITAL_COMMUNITY): Payer: Self-pay | Admitting: Pulmonary Disease

## 2016-06-05 DIAGNOSIS — R935 Abnormal findings on diagnostic imaging of other abdominal regions, including retroperitoneum: Secondary | ICD-10-CM | POA: Diagnosis not present

## 2016-06-05 DIAGNOSIS — R109 Unspecified abdominal pain: Secondary | ICD-10-CM

## 2016-06-05 DIAGNOSIS — K838 Other specified diseases of biliary tract: Secondary | ICD-10-CM | POA: Insufficient documentation

## 2016-06-05 DIAGNOSIS — D1803 Hemangioma of intra-abdominal structures: Secondary | ICD-10-CM | POA: Diagnosis not present

## 2016-06-05 DIAGNOSIS — D1809 Hemangioma of other sites: Secondary | ICD-10-CM | POA: Diagnosis not present

## 2016-06-05 DIAGNOSIS — R112 Nausea with vomiting, unspecified: Secondary | ICD-10-CM | POA: Insufficient documentation

## 2016-06-05 LAB — POCT I-STAT CREATININE: Creatinine, Ser: 1.7 mg/dL — ABNORMAL HIGH (ref 0.44–1.00)

## 2016-06-05 MED ORDER — GADOBENATE DIMEGLUMINE 529 MG/ML IV SOLN
10.0000 mL | Freq: Once | INTRAVENOUS | Status: AC | PRN
  Administered 2016-06-05: 4 mL via INTRAVENOUS

## 2016-08-09 DIAGNOSIS — I10 Essential (primary) hypertension: Secondary | ICD-10-CM | POA: Diagnosis not present

## 2016-08-09 DIAGNOSIS — K210 Gastro-esophageal reflux disease with esophagitis: Secondary | ICD-10-CM | POA: Diagnosis not present

## 2016-08-09 DIAGNOSIS — M545 Low back pain: Secondary | ICD-10-CM | POA: Diagnosis not present

## 2016-08-09 DIAGNOSIS — K900 Celiac disease: Secondary | ICD-10-CM | POA: Diagnosis not present

## 2016-10-17 ENCOUNTER — Encounter (INDEPENDENT_AMBULATORY_CARE_PROVIDER_SITE_OTHER): Payer: Self-pay | Admitting: *Deleted

## 2016-11-06 DIAGNOSIS — K210 Gastro-esophageal reflux disease with esophagitis: Secondary | ICD-10-CM | POA: Diagnosis not present

## 2016-11-06 DIAGNOSIS — I10 Essential (primary) hypertension: Secondary | ICD-10-CM | POA: Diagnosis not present

## 2016-11-06 DIAGNOSIS — M545 Low back pain: Secondary | ICD-10-CM | POA: Diagnosis not present

## 2016-11-07 ENCOUNTER — Encounter (INDEPENDENT_AMBULATORY_CARE_PROVIDER_SITE_OTHER): Payer: Self-pay | Admitting: Internal Medicine

## 2016-11-13 ENCOUNTER — Encounter (INDEPENDENT_AMBULATORY_CARE_PROVIDER_SITE_OTHER): Payer: Self-pay | Admitting: Internal Medicine

## 2016-11-13 ENCOUNTER — Ambulatory Visit (INDEPENDENT_AMBULATORY_CARE_PROVIDER_SITE_OTHER): Payer: Medicare Other | Admitting: Internal Medicine

## 2016-11-13 ENCOUNTER — Encounter (INDEPENDENT_AMBULATORY_CARE_PROVIDER_SITE_OTHER): Payer: Self-pay

## 2016-11-13 ENCOUNTER — Encounter (INDEPENDENT_AMBULATORY_CARE_PROVIDER_SITE_OTHER): Payer: Self-pay | Admitting: *Deleted

## 2016-11-13 VITALS — BP 160/100 | HR 60 | Temp 98.4°F | Ht 59.0 in | Wt 108.6 lb

## 2016-11-13 DIAGNOSIS — R195 Other fecal abnormalities: Secondary | ICD-10-CM | POA: Diagnosis not present

## 2016-11-13 LAB — COMPREHENSIVE METABOLIC PANEL
AG Ratio: 2 (calc) (ref 1.0–2.5)
ALBUMIN MSPROF: 3.6 g/dL (ref 3.6–5.1)
ALT: 10 U/L (ref 6–29)
AST: 18 U/L (ref 10–35)
Alkaline phosphatase (APISO): 51 U/L (ref 33–130)
BUN / CREAT RATIO: 16 (calc) (ref 6–22)
BUN: 22 mg/dL (ref 7–25)
CHLORIDE: 103 mmol/L (ref 98–110)
CO2: 28 mmol/L (ref 20–32)
Calcium: 8.2 mg/dL — ABNORMAL LOW (ref 8.6–10.4)
Creat: 1.4 mg/dL — ABNORMAL HIGH (ref 0.60–0.93)
GLOBULIN: 1.8 g/dL — AB (ref 1.9–3.7)
GLUCOSE: 92 mg/dL (ref 65–139)
Potassium: 4.4 mmol/L (ref 3.5–5.3)
Sodium: 136 mmol/L (ref 135–146)
Total Bilirubin: 0.4 mg/dL (ref 0.2–1.2)
Total Protein: 5.4 g/dL — ABNORMAL LOW (ref 6.1–8.1)

## 2016-11-13 NOTE — Progress Notes (Addendum)
Subjective:    Patient ID: [name] [name], female    DOB: Jul 21, 1940, 76 y.o.   MRN: [id number]  HPI Present today with c/o clay colored stools. She tells me her stools are yellow or orange in color.  She says she does not eat any vegetables. She has a BM daily. She denies any abdominal pain.  She takes Percocet x 4 a day. Takes Percocet for pelvic pain.  Her appetite is good. She denies any weight loss.  Hx of Celiac disease and is not quite compliant with this.   Her last colonoscopy was in 2007 which revealed (Hematochezia):    FINAL DIAGNOSES:  1.  Examination performed to cecum.  2.  A single tiny diverticulum at he sigmoid colon.  3.  A cluster of small polyps at the distal sigmoid colon ablated by a cold      biopsy, suspicious for hyperplastic polyps.  4.  External hemorrhoids.     06/05/2016 MRI abdomen w/wo CM:  Dilated CBD, Chronic abdominal pain. Nausea with vomiting.  IMPRESSION: Mild biliary ductal dilatation with common bile duct measuring 11 mm. No choledocholithiasis or other obstructing etiology identified.  1.9 cm benign hemangioma in the left hepatic lobe.  Large amount of colonic stool noted; suggest clinical correlation for possible constipation.   08/19/2013 EGD: N, V, epigastric pain.  Symptoms x 2 months.   Impression: Small sliding hiatal hernia. Moderate amount of food debris in the stomach with  patent pylorus indicative of gastroparesis. Nonerosive antral gastritis. Abnormal appearence to post bulbar mucosa. Biopsy taken to rule out celiac disease. H. Pylori positive. Covered with pylera. Duodenal biopsy: Celiac disease  Review of Systems Past Medical History:  Diagnosis Date  . Anxiety   . Depression   . GERD (gastroesophageal reflux disease)   . Hypercholesteremia   . Hypertension     Past Surgical History:  Procedure Laterality Date  . BREAST REDUCTION SURGERY    . CESAREAN SECTION    . COLONOSCOPY  08/2005  .  ESOPHAGOGASTRODUODENOSCOPY N/A 08/19/2013   Procedure: ESOPHAGOGASTRODUODENOSCOPY (EGD);  Surgeon: Rogene Houston, MD;  Location: [facility];  Service: Endoscopy;  Laterality: N/A;  11:15 - rescheduled to 7:30 - Ann notified pt  . excision of submandibular gland    . HEMORRHOID SURGERY    . STOMACH SURGERY     years ago. ? reason  . TONSILLECTOMY      Allergies  Allergen Reactions  . Morphine And Related Other (See Comments)    Patient stated that she thinks that morphine was one of the medication that she had a bad reaction to. She can not remember the other. She said that she blacked out when she took it.  . Other     Allergic to 2 meds, does not know what they are.    Current Outpatient Prescriptions on File Prior to Visit  Medication Sig Dispense Refill  . alendronate (FOSAMAX) 70 MG tablet Take 70 mg by mouth once a week. Take with a full glass of water on an empty stomach.    Marland Kitchen atenolol (TENORMIN) 50 MG tablet Take 50 mg by mouth daily.    . celecoxib (CELEBREX) 100 MG capsule TAKE ONE CAPSULE BY MOUTH DAILY. 30 capsule 2  . clobetasol cream (TEMOVATE) 0.05 %     . Cyanocobalamin (VITAMIN B 12 PO) Take 1 tablet by mouth daily.    . diphenhydrAMINE (BENADRYL) 25 MG tablet Take 25 mg by mouth every 6 (six) hours  as needed for allergies.    Marland Kitchen esomeprazole (NEXIUM) 40 MG capsule Take 40 mg by mouth daily at 12 noon.    Marland Kitchen LORazepam (ATIVAN) 1 MG tablet Take 1 mg by mouth 2 (two) times daily. scheduled    . Multiple Vitamin (MULTIVITAMIN WITH MINERALS) TABS Take 1 tablet by mouth daily.    Marland Kitchen omega-3 acid ethyl esters (LOVAZA) 1 G capsule Take 1 g by mouth daily.    . QUEtiapine (SEROQUEL) 25 MG tablet Take 25 mg by mouth at bedtime.    . simvastatin (ZOCOR) 40 MG tablet Take 40 mg by mouth at bedtime.    Marland Kitchen tiZANidine (ZANAFLEX) 4 MG tablet Take 4 mg by mouth every 8 (eight) hours as needed for muscle spasms.    Marland Kitchen zolpidem (AMBIEN) 10 MG tablet Take 10 mg by mouth at bedtime as  needed for sleep.     No current facility-administered medications on file prior to visit.         Objective:   Physical Exam Blood pressure (!) 160/100, pulse 60, temperature 98.4 F (36.9 C), height 4\' 11"  (1.499 m), weight 108 lb 9.6 oz (49.3 kg).  Alert and oriented. Skin warm and dry. Oral mucosa is moist.   . Sclera anicteric, conjunctivae is pink. Thyroid not enlarged. No cervical lymphadenopathy. Lungs clear. Heart regular rate and rhythm.  Abdomen is soft. Bowel sounds are positive. No hepatomegaly. No abdominal masses felt. No tenderness.  No edema to lower extremities.  Stool is brown and guaiac negative.         Assessment & Plan:  Clay colored stools.  US abdomen. CMET She is overdue for a colonoscopy. Will discuss once I have [place] and labs back.

## 2016-11-13 NOTE — Patient Instructions (Addendum)
Hepatic and US abdomen.  

## 2016-11-16 ENCOUNTER — Ambulatory Visit (HOSPITAL_COMMUNITY)
Admission: RE | Admit: 2016-11-16 | Discharge: 2016-11-16 | Disposition: A | Discharge status: Home / Self Care | Payer: Medicare Other | Source: Ambulatory Visit | Attending: Internal Medicine | Admitting: Internal Medicine

## 2016-11-16 DIAGNOSIS — R112 Nausea with vomiting, unspecified: Secondary | ICD-10-CM | POA: Diagnosis not present

## 2016-11-16 DIAGNOSIS — R195 Other fecal abnormalities: Secondary | ICD-10-CM | POA: Diagnosis not present

## 2016-11-17 ENCOUNTER — Ambulatory Visit (HOSPITAL_COMMUNITY): Payer: Medicare Other

## 2016-11-21 ENCOUNTER — Other Ambulatory Visit (INDEPENDENT_AMBULATORY_CARE_PROVIDER_SITE_OTHER): Payer: Self-pay | Admitting: Internal Medicine

## 2016-11-21 DIAGNOSIS — Z1211 Encounter for screening for malignant neoplasm of colon: Secondary | ICD-10-CM

## 2016-11-22 ENCOUNTER — Encounter (INDEPENDENT_AMBULATORY_CARE_PROVIDER_SITE_OTHER): Payer: Self-pay | Admitting: *Deleted

## 2016-11-22 DIAGNOSIS — Z1211 Encounter for screening for malignant neoplasm of colon: Secondary | ICD-10-CM | POA: Insufficient documentation

## 2017-01-23 ENCOUNTER — Telehealth (INDEPENDENT_AMBULATORY_CARE_PROVIDER_SITE_OTHER): Payer: Self-pay | Admitting: *Deleted

## 2017-01-23 ENCOUNTER — Encounter (INDEPENDENT_AMBULATORY_CARE_PROVIDER_SITE_OTHER): Payer: Self-pay | Admitting: *Deleted

## 2017-01-23 MED ORDER — PEG 3350-KCL-NA BICARB-NACL 420 G PO SOLR: 4000.0000 mL | Freq: Once | ORAL | 0 refills | Status: AC

## 2017-01-23 NOTE — Telephone Encounter (Signed)
Patient needs trilyte 

## 2017-02-05 DIAGNOSIS — Z23 Encounter for immunization: Secondary | ICD-10-CM | POA: Diagnosis not present

## 2017-02-05 DIAGNOSIS — Z0000 Encounter for general adult medical examination without abnormal findings: Secondary | ICD-10-CM | POA: Diagnosis not present

## 2017-02-08 DIAGNOSIS — M129 Arthropathy, unspecified: Secondary | ICD-10-CM | POA: Diagnosis not present

## 2017-02-08 DIAGNOSIS — E785 Hyperlipidemia, unspecified: Secondary | ICD-10-CM | POA: Diagnosis not present

## 2017-02-08 DIAGNOSIS — I10 Essential (primary) hypertension: Secondary | ICD-10-CM | POA: Diagnosis not present

## 2017-02-08 DIAGNOSIS — Z0000 Encounter for general adult medical examination without abnormal findings: Secondary | ICD-10-CM | POA: Diagnosis not present

## 2017-02-22 ENCOUNTER — Ambulatory Visit (HOSPITAL_COMMUNITY): Admission: RE | Admit: 2017-02-22 | Payer: Medicare Other | Source: Ambulatory Visit | Admitting: Internal Medicine

## 2017-02-22 SURGERY — COLONOSCOPY
Anesthesia: Moderate Sedation

## 2017-04-09 ENCOUNTER — Ambulatory Visit (INDEPENDENT_AMBULATORY_CARE_PROVIDER_SITE_OTHER): Payer: Medicare Other | Admitting: Internal Medicine

## 2017-04-09 ENCOUNTER — Encounter (INDEPENDENT_AMBULATORY_CARE_PROVIDER_SITE_OTHER): Payer: Self-pay | Admitting: Internal Medicine

## 2017-04-09 VITALS — BP 122/80 | HR 60 | Temp 97.9°F | Ht 59.0 in | Wt 104.3 lb

## 2017-04-09 DIAGNOSIS — G8929 Other chronic pain: Secondary | ICD-10-CM | POA: Diagnosis not present

## 2017-04-09 DIAGNOSIS — R1013 Epigastric pain: Secondary | ICD-10-CM

## 2017-04-09 DIAGNOSIS — K219 Gastro-esophageal reflux disease without esophagitis: Secondary | ICD-10-CM

## 2017-04-09 MED ORDER — OMEPRAZOLE 40 MG PO CPDR: 40.0000 mg | DELAYED_RELEASE_CAPSULE | Freq: Every day | ORAL | 3 refills | Status: DC

## 2017-04-09 NOTE — Patient Instructions (Signed)
Rx for Omeprazole 40mg  daily. Stop the Nexium Labs today.

## 2017-04-09 NOTE — Progress Notes (Signed)
Subjective:    Patient ID: [name] [name], female    DOB: 1941-02-09, 77 y.o.   MRN: [id number] PCP Dr. Luan Pulling HPI Here today with c/o abdominal pain.  She was scheduled for a colonoscopy 02/22/2017 but cancelled apparently. Seen in September of 2018 with c/o clay color stools. Her liver enzymes were normal.  She states she is not going to have a colonoscopy. For 5 days she says her stomach groans. She is having nausea. She is following a gluten free diet. She eats chicken and fish, baked potatoes.  She says the epigastric pain is the same as she always has. She says she has been out of her Nexium x 1 day. She has a BM daily. No melena or BRRB.  Takes Percocet four times a day chronic back pain Takes Lorazepam BID Takes Ambien at night.  02/16/2017 ALP 60, AST 19, ALT 15. Total bili 0.5 H and H 11.3 and 33.9, MCV 93.9   11/16/2016 US abdomen complete: N and V with clay color stools.   IMPRESSION: 1. Persistent dilatation of the common bile duct, not appreciably changed. No biliary duct mass or calculus evident by ultrasound.  2. No liver lesions appreciable on this study. A small hemangioma in the left lobe of the liver seen on prior MR is not appreciable currently on this study.  3. Portions of pancreas obscured by gas. Visualized portions of pancreas appear normal.  4. Slight fullness of each renal collecting system without focal obstructing focus. Kidneys are rather small which may be a function of age or potentially could represent a degree of underlying medical renal disease. Appropriate laboratory correlation advised.     06/05/2016 MRI abdomen w/wo CM:  Dilated CBD, Chronic abdominal pain. Nausea with vomiting.  IMPRESSION: Mild biliary ductal dilatation with common bile duct measuring 11 mm. No choledocholithiasis or other obstructing etiology identified.  1.9 cm benign hemangioma in the left hepatic lobe.  Large amount of colonic stool noted; suggest  clinical correlation for possible constipation.   08/19/2013 EGD: N, V, epigastric pain.  Symptoms x 2 months.   Impression: Small sliding hiatal hernia. Moderate amount of food debris in the stomach with patent pylorus indicative of gastroparesis. Nonerosive antral gastritis. Abnormal appearence to post bulbar mucosa. Biopsy taken to rule out celiac disease. H. Pylori positive. Covered with pylera. Duodenal biopsy: Celiac disease  Review of Systems Past Medical History:  Diagnosis Date  . Anxiety   . Depression   . GERD (gastroesophageal reflux disease)   . Hypercholesteremia   . Hypertension     Past Surgical History:  Procedure Laterality Date  . BREAST REDUCTION SURGERY    . CESAREAN SECTION    . COLONOSCOPY  08/2005  . ESOPHAGOGASTRODUODENOSCOPY N/A 08/19/2013   Procedure: ESOPHAGOGASTRODUODENOSCOPY (EGD);  Surgeon: Rogene Houston, MD;  Location: [facility];  Service: Endoscopy;  Laterality: N/A;  11:15 - rescheduled to 7:30 - Ann notified pt  . excision of submandibular gland    . HEMORRHOID SURGERY    . STOMACH SURGERY     years ago. ? reason  . TONSILLECTOMY      Allergies  Allergen Reactions  . Morphine And Related Other (See Comments)    Patient stated that she thinks that morphine was one of the medication that she had a bad reaction to. She can not remember the other. She said that she blacked out when she took it.  . Other     Allergic to 2 meds, does  not know what they are.    Current Outpatient Medications on File Prior to Visit  Medication Sig Dispense Refill  . alendronate (FOSAMAX) 70 MG tablet Take 70 mg by mouth once a week. Take with a full glass of water on an empty stomach.    Marland Kitchen atenolol (TENORMIN) 50 MG tablet Take 50 mg by mouth daily.    . celecoxib (CELEBREX) 100 MG capsule TAKE ONE CAPSULE BY MOUTH DAILY. 30 capsule 2  . clobetasol cream (TEMOVATE) 0.05 %     . Cyanocobalamin (VITAMIN B 12 PO) Take 1 tablet by mouth daily.    .  diphenhydrAMINE (BENADRYL) 25 MG tablet Take 25 mg by mouth every 6 (six) hours as needed for allergies.    Marland Kitchen esomeprazole (NEXIUM) 40 MG capsule Take 40 mg by mouth daily at 12 noon.    Marland Kitchen LORazepam (ATIVAN) 1 MG tablet Take 1 mg by mouth 2 (two) times daily. scheduled    . Multiple Vitamin (MULTIVITAMIN WITH MINERALS) TABS Take 1 tablet by mouth daily.    Marland Kitchen omega-3 acid ethyl esters (LOVAZA) 1 G capsule Take 1 g by mouth daily.    Marland Kitchen oxybutynin (DITROPAN) 5 MG tablet Take 5 mg by mouth 3 (three) times daily.    Marland Kitchen oxyCODONE-acetaminophen (PERCOCET) 10-325 MG tablet Take 1 tablet by mouth every 4 (four) hours as needed for pain.    Marland Kitchen QUEtiapine (SEROQUEL) 25 MG tablet Take 25 mg by mouth at bedtime.    . simvastatin (ZOCOR) 40 MG tablet Take 40 mg by mouth at bedtime.    Marland Kitchen tiZANidine (ZANAFLEX) 4 MG tablet Take 4 mg by mouth every 8 (eight) hours as needed for muscle spasms.    Marland Kitchen zolpidem (AMBIEN) 10 MG tablet Take 10 mg by mouth at bedtime as needed for sleep.     No current facility-administered medications on file prior to visit.         Objective:   Physical Exam Blood pressure 122/80, pulse 60, temperature 97.9 F (36.6 C), height 4\' 11"  (1.499 m), weight 104 lb 4.8 oz (47.3 kg). Alert and oriented. Skin warm and dry. Oral mucosa is moist.   . Sclera anicteric, conjunctivae is pink. Thyroid not enlarged. No cervical lymphadenopathy. Lungs clear. Heart regular rate and rhythm.  Abdomen is soft. Bowel sounds are positive. No hepatomegaly. No abdominal masses felt. No tenderness.  No edema to lower extremities.               Assessment & Plan:  Epigastric pain. Am going to try her on Omeprazole 40mg  to take one am before breakfast. She refuses a colonoscopy.  OV in 3 months to reevaluate her CBC (Hemoglobin slight below normal). Hepatic function.

## 2017-04-10 LAB — CBC WITH DIFFERENTIAL/PLATELET
Basophils Absolute: 42 cells/uL (ref 0–200)
Basophils Relative: 0.6 %
Eosinophils Absolute: 119 cells/uL (ref 15–500)
Eosinophils Relative: 1.7 %
HCT: 34.5 % — ABNORMAL LOW (ref 35.0–45.0)
Hemoglobin: 11.5 g/dL — ABNORMAL LOW (ref 11.7–15.5)
Lymphs Abs: 2366 cells/uL (ref 850–3900)
MCH: 30.8 pg (ref 27.0–33.0)
MCHC: 33.3 g/dL (ref 32.0–36.0)
MCV: 92.5 fL (ref 80.0–100.0)
MPV: 11.3 fL (ref 7.5–12.5)
Monocytes Relative: 11.5 %
NEUTROS PCT: 52.4 %
Neutro Abs: 3668 cells/uL (ref 1500–7800)
PLATELETS: 223 10*3/uL (ref 140–400)
RBC: 3.73 10*6/uL — AB (ref 3.80–5.10)
RDW: 11.6 % (ref 11.0–15.0)
TOTAL LYMPHOCYTE: 33.8 %
WBC: 7 10*3/uL (ref 3.8–10.8)
WBCMIX: 805 {cells}/uL (ref 200–950)

## 2017-04-10 LAB — HEPATIC FUNCTION PANEL
AG RATIO: 2.1 (calc) (ref 1.0–2.5)
ALT: 11 U/L (ref 6–29)
AST: 20 U/L (ref 10–35)
Albumin: 3.7 g/dL (ref 3.6–5.1)
Alkaline phosphatase (APISO): 47 U/L (ref 33–130)
BILIRUBIN DIRECT: 0.1 mg/dL (ref 0.0–0.2)
GLOBULIN: 1.8 g/dL — AB (ref 1.9–3.7)
Indirect Bilirubin: 0.3 mg/dL (calc) (ref 0.2–1.2)
Total Bilirubin: 0.4 mg/dL (ref 0.2–1.2)
Total Protein: 5.5 g/dL — ABNORMAL LOW (ref 6.1–8.1)

## 2017-05-21 DIAGNOSIS — I10 Essential (primary) hypertension: Secondary | ICD-10-CM | POA: Diagnosis not present

## 2017-05-21 DIAGNOSIS — K210 Gastro-esophageal reflux disease with esophagitis: Secondary | ICD-10-CM | POA: Diagnosis not present

## 2017-05-21 DIAGNOSIS — M545 Low back pain: Secondary | ICD-10-CM | POA: Diagnosis not present

## 2017-05-21 DIAGNOSIS — R109 Unspecified abdominal pain: Secondary | ICD-10-CM | POA: Diagnosis not present

## 2017-05-26 ENCOUNTER — Encounter (HOSPITAL_COMMUNITY): Payer: Self-pay | Admitting: Emergency Medicine

## 2017-05-26 ENCOUNTER — Other Ambulatory Visit: Payer: Self-pay

## 2017-05-26 ENCOUNTER — Emergency Department (HOSPITAL_COMMUNITY)
Admission: EM | Admit: 2017-05-26 | Discharge: 2017-05-26 | Disposition: A | Discharge status: Home / Self Care | Payer: Medicare Other | Attending: Emergency Medicine | Admitting: Emergency Medicine

## 2017-05-26 ENCOUNTER — Emergency Department (HOSPITAL_COMMUNITY): Payer: Medicare Other

## 2017-05-26 DIAGNOSIS — W19XXXA Unspecified fall, initial encounter: Secondary | ICD-10-CM

## 2017-05-26 DIAGNOSIS — Y93E1 Activity, personal bathing and showering: Secondary | ICD-10-CM | POA: Insufficient documentation

## 2017-05-26 DIAGNOSIS — Y92002 Bathroom of unspecified non-institutional (private) residence single-family (private) house as the place of occurrence of the external cause: Secondary | ICD-10-CM | POA: Diagnosis not present

## 2017-05-26 DIAGNOSIS — S0990XA Unspecified injury of head, initial encounter: Secondary | ICD-10-CM | POA: Insufficient documentation

## 2017-05-26 DIAGNOSIS — Z79899 Other long term (current) drug therapy: Secondary | ICD-10-CM | POA: Diagnosis not present

## 2017-05-26 DIAGNOSIS — S20212A Contusion of left front wall of thorax, initial encounter: Secondary | ICD-10-CM | POA: Diagnosis not present

## 2017-05-26 DIAGNOSIS — Y998 Other external cause status: Secondary | ICD-10-CM | POA: Diagnosis not present

## 2017-05-26 DIAGNOSIS — W182XXA Fall in (into) shower or empty bathtub, initial encounter: Secondary | ICD-10-CM | POA: Diagnosis not present

## 2017-05-26 DIAGNOSIS — S3991XA Unspecified injury of abdomen, initial encounter: Secondary | ICD-10-CM | POA: Diagnosis not present

## 2017-05-26 DIAGNOSIS — E7800 Pure hypercholesterolemia, unspecified: Secondary | ICD-10-CM | POA: Diagnosis not present

## 2017-05-26 DIAGNOSIS — R109 Unspecified abdominal pain: Secondary | ICD-10-CM | POA: Diagnosis not present

## 2017-05-26 DIAGNOSIS — I10 Essential (primary) hypertension: Secondary | ICD-10-CM | POA: Insufficient documentation

## 2017-05-26 DIAGNOSIS — S299XXA Unspecified injury of thorax, initial encounter: Secondary | ICD-10-CM | POA: Diagnosis not present

## 2017-05-26 DIAGNOSIS — R1084 Generalized abdominal pain: Secondary | ICD-10-CM | POA: Diagnosis not present

## 2017-05-26 LAB — BASIC METABOLIC PANEL WITH GFR
Anion gap: 9 (ref 5–15)
BUN: 21 mg/dL — ABNORMAL HIGH (ref 6–20)
CO2: 20 mmol/L — ABNORMAL LOW (ref 22–32)
Calcium: 8.5 mg/dL — ABNORMAL LOW (ref 8.9–10.3)
Chloride: 109 mmol/L (ref 101–111)
Creatinine, Ser: 1.21 mg/dL — ABNORMAL HIGH (ref 0.44–1.00)
GFR calc Af Amer: 49 mL/min — ABNORMAL LOW
GFR calc non Af Amer: 42 mL/min — ABNORMAL LOW
Glucose, Bld: 96 mg/dL (ref 65–99)
Potassium: 4 mmol/L (ref 3.5–5.1)
Sodium: 138 mmol/L (ref 135–145)

## 2017-05-26 LAB — CBC WITH DIFFERENTIAL/PLATELET
Basophils Absolute: 0 K/uL (ref 0.0–0.1)
Basophils Relative: 0 %
Eosinophils Absolute: 0.1 K/uL (ref 0.0–0.7)
Eosinophils Relative: 2 %
HCT: 37.4 % (ref 36.0–46.0)
Hemoglobin: 12 g/dL (ref 12.0–15.0)
Lymphocytes Relative: 23 %
Lymphs Abs: 1.5 K/uL (ref 0.7–4.0)
MCH: 30.6 pg (ref 26.0–34.0)
MCHC: 32.1 g/dL (ref 30.0–36.0)
MCV: 95.4 fL (ref 78.0–100.0)
Monocytes Absolute: 0.6 K/uL (ref 0.1–1.0)
Monocytes Relative: 10 %
Neutro Abs: 4.3 K/uL (ref 1.7–7.7)
Neutrophils Relative %: 65 %
Platelets: 212 K/uL (ref 150–400)
RBC: 3.92 MIL/uL (ref 3.87–5.11)
RDW: 13 % (ref 11.5–15.5)
WBC: 6.6 K/uL (ref 4.0–10.5)

## 2017-05-26 MED ORDER — FENTANYL CITRATE (PF) 100 MCG/2ML IJ SOLN
50.0000 ug | Freq: Once | INTRAMUSCULAR | Status: AC
Start: 2017-05-26 — End: 2017-05-26
  Administered 2017-05-26: 50 ug via INTRAVENOUS
  Filled 2017-05-26: qty 2

## 2017-05-26 MED ORDER — IOPAMIDOL (ISOVUE-300) INJECTION 61%
75.0000 mL | Freq: Once | INTRAVENOUS | Status: AC | PRN
  Administered 2017-05-26: 75 mL via INTRAVENOUS

## 2017-05-26 NOTE — ED Provider Notes (Signed)
[facility] EMERGENCY DEPARTMENT Provider Note   CSN: [id number] Arrival date & time: 05/26/17  1232     History   Chief Complaint Chief Complaint  Patient presents with  . Fall    HPI [name] [name] is a 77 y.o. female.  HPI Patient presents after a fall in the shower.  States that she was taking a shower because she had had some diarrhea on herself.  She has a history of GI issues and this is not that unusual.  States that she got out and then had more diarrhea on it back in.  Then she slipped and fell backwards landing into the top.  States she had her left flank.  States that she thinks she broke some ribs.  She said the shower bar did land on her head but no loss conscious.  Not on anticoagulation.  No lightheadedness or dizziness.  Does have pain with breathing.  States she is on Ativan and Percocet normally for her chronic knee pains. Past Medical History:  Diagnosis Date  . Anxiety   . Depression   . GERD (gastroesophageal reflux disease)   . Hypercholesteremia   . Hypertension     Patient Active Problem List   Diagnosis Date Noted  . GERD (gastroesophageal reflux disease) 04/09/2017  . Encounter for screening colonoscopy 11/22/2016  . Common bile duct dilatation 08/13/2013  . Nausea with vomiting 08/13/2013  . Bilateral leg weakness 06/24/2013  . Posture imbalance 06/24/2013  . Chronic radicular low back pain 06/24/2013    Past Surgical History:  Procedure Laterality Date  . BREAST REDUCTION SURGERY    . CESAREAN SECTION    . COLONOSCOPY  08/2005  . ESOPHAGOGASTRODUODENOSCOPY N/A 08/19/2013   Procedure: ESOPHAGOGASTRODUODENOSCOPY (EGD);  Surgeon: Rogene Houston, MD;  Location: [facility]O [facility];  Service: Endoscopy;  Laterality: N/A;  11:15 - rescheduled to 7:30 - Ann notified pt  . excision of submandibular gland    . HEMORRHOID SURGERY    . STOMACH SURGERY     years ago. ? reason  . TONSILLECTOMY       OB History    Gravida  2   Para      Term      Preterm      AB      Living  1     SAB      TAB      Ectopic      Multiple      Live Births               Home Medications    Prior to Admission medications   Medication Sig Start Date End Date Taking? Authorizing Provider  alendronate (FOSAMAX) 70 MG tablet Take 70 mg by mouth once a week. Take with a full glass of water on an empty stomach. 03/17/13  Yes [provider]  atenolol (TENORMIN) 50 MG tablet Take 50 mg by mouth daily. 05/15/13  Yes [provider]  celecoxib (CELEBREX) 100 MG capsule TAKE ONE CAPSULE BY MOUTH DAILY. 05/17/15  Yes Rehman, Mechele Dawley, MD  clobetasol cream (TEMOVATE) 2.45 % Apply 1 application topically 2 (two) times daily.  09/06/15  Yes [provider]  Cyanocobalamin (VITAMIN B 12 PO) Take 1 tablet by mouth daily.   Yes [provider]  diphenhydrAMINE (BENADRYL) 25 MG tablet Take 25 mg by mouth every 6 (six) hours as needed for allergies.   Yes [provider]  esomeprazole (NEXIUM) 40 MG capsule Take  40 mg by mouth daily at 12 noon.   Yes [provider]  LORazepam (ATIVAN) 1 MG tablet Take 1 mg by mouth 2 (two) times daily. scheduled 05/21/12  Yes [provider]  Multiple Vitamin (MULTIVITAMIN WITH MINERALS) TABS Take 1 tablet by mouth daily.   Yes [provider]  omega-3 acid ethyl esters (LOVAZA) 1 G capsule Take 1 g by mouth daily.   Yes [provider]  oxyCODONE-acetaminophen (PERCOCET) 10-325 MG tablet Take 1 tablet by mouth every 4 (four) hours as needed for pain.   Yes [provider]  QUEtiapine (SEROQUEL) 25 MG tablet Take 25 mg by mouth at bedtime. 04/17/13  Yes [provider]  simvastatin (ZOCOR) 40 MG tablet Take 40 mg by mouth at bedtime. 05/15/13  Yes [provider]  tiZANidine (ZANAFLEX) 4 MG tablet Take 4 mg by mouth every 8 (eight) hours as needed for muscle spasms. 12/21/12  Yes [provider]  zolpidem (AMBIEN) 10  MG tablet Take 10 mg by mouth at bedtime as needed for sleep.   Yes [provider]  omeprazole (PRILOSEC) 40 MG capsule Take 1 capsule (40 mg total) by mouth daily. Patient not taking: Reported on 05/26/2017 04/09/17   Butch Penny, NP    Family History Family History  Problem Relation Age of Onset  . Sleep apnea Son   . Diabetes Son     Social History Social History   Tobacco Use  . Smoking status: Never Smoker  . Smokeless tobacco: Never Used  Substance Use Topics  . Alcohol use: No    Alcohol/week: 0.0 oz  . Drug use: No     Allergies   Morphine and related and Other   Review of Systems Review of Systems  Constitutional: Negative for appetite change and fever.  HENT: Negative for congestion.   Gastrointestinal: Positive for abdominal pain and diarrhea.  Genitourinary: Positive for flank pain.  Musculoskeletal: Negative for back pain.  Neurological: Negative for headaches.  Hematological: Does not bruise/bleed easily.  Psychiatric/Behavioral: Negative for confusion.     Physical Exam Updated Vital Signs BP 120/72 (BP Location: Left Arm)   Pulse 64   Temp 97.8 F (36.6 C) (Oral)   Resp 16   Ht 4\' 11"  (1.499 m)   Wt 47.2 kg (104 lb)   SpO2 100%   BMI 21.01 kg/m   Physical Exam  Constitutional: She appears well-developed.  HENT:  Head: Normocephalic and atraumatic.  Eyes: EOM are normal.  Neck: Neck supple.  Cardiovascular: Normal rate.  Pulmonary/Chest: She exhibits tenderness.  Tenderness on left anterior and lateral lower chest wall.  No crepitance deformity.  No subcu emphysema.  Abdominal: There is tenderness.  Left upper quadrant tenderness without rebound or guarding.  Musculoskeletal: She exhibits no tenderness.  No lumbar spine tenderness.  Neurological: She is alert.  Skin: Skin is warm. Capillary refill takes less than 2 seconds.  Psychiatric: She has a normal mood and affect.     ED Treatments / Results  Labs (all labs  ordered are listed, but only abnormal results are displayed) Labs Reviewed  BASIC METABOLIC PANEL - Abnormal; Notable for the following components:      Result Value   CO2 20 (*)    BUN 21 (*)    Creatinine, Ser 1.21 (*)    Calcium 8.5 (*)    GFR calc non Af Amer 42 (*)    GFR calc Af Amer 49 (*)    All  other components within normal limits  CBC WITH DIFFERENTIAL/PLATELET    EKG None  Radiology Ct Chest W Contrast  Result Date: 05/26/2017 CLINICAL DATA:  77 year old who fell in the shower earlier this morning, striking the LEFT flank. Initial encounter. Surgical history includes BILATERAL reduction mammoplasty and an unspecified abdominal surgery many years ago. EXAM: CT CHEST, ABDOMEN, AND PELVIS WITH CONTRAST TECHNIQUE: Multidetector CT imaging of the chest, abdomen and pelvis was performed following the standard protocol during bolus administration of intravenous contrast. CONTRAST:  43mL ISOVUE-300 IOPAMIDOL INJECTION 61% IV. Oral contrast was not administered. COMPARISON:  No prior chest CT. CT pelvis 04/28/2012. Abdominal MRI/MRCP 06/05/2016. Abdominal ultrasound 05/15/2016. FINDINGS: CT CHEST FINDINGS Cardiovascular: Mild-to-moderate atherosclerosis involving the thoracic aorta with calcified and noncalcified plaque. No evidence of aneurysm or dissection. Heart mildly enlarged. Mitral annular calcification. LEFT dominant coronary circulation. Mild atherosclerosis involving the LEFT circumflex coronary artery. Central pulmonary arteries patent. Mediastinum/Nodes: Small hiatal hernia. No pathologically enlarged mediastinal, hilar or axillary lymph nodes. No mediastinal masses. Normal-appearing esophagus. Lungs/Pleura: 3 mm nodule involving the posteroinferior RIGHT upper lobe (series 3, image 56). No other nodules elsewhere in either lung. Pleuroparenchymal scarring laterally in the RIGHT middle lobe abutting the minor fissure. Scarring involving the medial LEFT lower lobe. Lungs otherwise  clear. No confluent airspace consolidation. No evidence of interstitial lung disease. Calcified tracheobronchial cartilages. Central airways patent without significant bronchial wall thickening. No pleural effusions. Musculoskeletal: Slight thoracic dextroscoliosis. Osseous demineralization. Mild degenerative disc disease and spondylosis throughout the thoracic spine. No acute findings. CT ABDOMEN PELVIS FINDINGS Hepatobiliary: Approximate 2.0 cm mass involving the LATERAL segment LEFT lobe of liver (series 2, image 47) demonstrating early peripheral enhancement, completely filled in on the delayed images, indicating benign hemangioma, unchanged from the prior MRI. No suspicious solid masses involving the liver. Calcified granuloma in the POSTERIOR segment RIGHT lobe. Mild intra and extrahepatic biliary ductal dilation as noted on the prior MRCP. The common bile duct can be followed to the ampulla without evidence of obstructing stone or mass. Normal-appearing gallbladder. Pancreas: Normal in appearance without evidence of mass, ductal dilation, or inflammation. Spleen: Normal in size and appearance. Adrenals/Urinary Tract: Normal appearing adrenal glands. BILATERAL extrarenal pelves. No evidence of hydronephrosis involving either kidney. No focal parenchymal abnormality involving either kidney. No urinary tract calculi on either side. Normal appearing urinary bladder. Stomach/Bowel: Small hiatal hernia as noted above. Remainder of the stomach decompressed and unremarkable. Normal-appearing small bowel. Entire colon relatively decompressed, containing liquid stool. Scattered sigmoid colon diverticula without evidence of acute diverticulitis. Appendix not clearly visualized, though no evidence of pericecal inflammation. Vascular/Lymphatic: Moderate aortoiliac atherosclerosis without evidence of aneurysm. Normal-appearing portal venous and systemic venous systems. No pathologic lymphadenopathy. Reproductive:  Normal-appearing atrophic uterus. No adnexal masses or free pelvic fluid. Other: Densely calcified injection granulomata in the RIGHT buttock. Musculoskeletal: Compression fracture of the UPPER endplate of L4 which was present on a prior MRI from 2014. Severe degenerative disc disease at L2-3. Facet degenerative changes throughout the lumbar spine. Osseous demineralization. No acute findings. IMPRESSION: CT Chest: 1. No evidence of acute traumatic injury to the thorax. 2. 3 mm nodule involving the posteroinferior RIGHT upper lobe. No follow-up needed if patient is low-risk. Non-contrast chest CT can be considered in 12 months if patient is high-risk. This recommendation follows the consensus statement: Guidelines for Management of Incidental Pulmonary Nodules Detected on CT Images: From the Fleischner Society 2017; Radiology 2017; 284:228-243. 3.  No acute cardiopulmonary disease. CT Abdomen and Pelvis: 1. No  evidence of acute traumatic injury to the abdomen or pelvis. 2. Small hiatal hernia. 3. Scattered sigmoid colon diverticula without evidence of acute diverticulitis. 4. Benign 2 cm hemangioma involving the LEFT lobe of the liver, stable dating back to a prior MRI 06/05/2016. 5. Mild intra and extrahepatic biliary ductal dilation without evidence of obstructing stone or mass, stable dating back to the prior MRI. Aortic Atherosclerosis (ICD10-I70.0). Electronically Signed   By: Evangeline Dakin M.D.   On: 05/26/2017 15:10   Ct Abdomen Pelvis W Contrast  Result Date: 05/26/2017 CLINICAL DATA:  77 year old who fell in the shower earlier this morning, striking the LEFT flank. Initial encounter. Surgical history includes BILATERAL reduction mammoplasty and an unspecified abdominal surgery many years ago. EXAM: CT CHEST, ABDOMEN, AND PELVIS WITH CONTRAST TECHNIQUE: Multidetector CT imaging of the chest, abdomen and pelvis was performed following the standard protocol during bolus administration of intravenous  contrast. CONTRAST:  43mL ISOVUE-300 IOPAMIDOL INJECTION 61% IV. Oral contrast was not administered. COMPARISON:  No prior chest CT. CT pelvis 04/28/2012. Abdominal MRI/MRCP 06/05/2016. Abdominal ultrasound 05/15/2016. FINDINGS: CT CHEST FINDINGS Cardiovascular: Mild-to-moderate atherosclerosis involving the thoracic aorta with calcified and noncalcified plaque. No evidence of aneurysm or dissection. Heart mildly enlarged. Mitral annular calcification. LEFT dominant coronary circulation. Mild atherosclerosis involving the LEFT circumflex coronary artery. Central pulmonary arteries patent. Mediastinum/Nodes: Small hiatal hernia. No pathologically enlarged mediastinal, hilar or axillary lymph nodes. No mediastinal masses. Normal-appearing esophagus. Lungs/Pleura: 3 mm nodule involving the posteroinferior RIGHT upper lobe (series 3, image 56). No other nodules elsewhere in either lung. Pleuroparenchymal scarring laterally in the RIGHT middle lobe abutting the minor fissure. Scarring involving the medial LEFT lower lobe. Lungs otherwise clear. No confluent airspace consolidation. No evidence of interstitial lung disease. Calcified tracheobronchial cartilages. Central airways patent without significant bronchial wall thickening. No pleural effusions. Musculoskeletal: Slight thoracic dextroscoliosis. Osseous demineralization. Mild degenerative disc disease and spondylosis throughout the thoracic spine. No acute findings. CT ABDOMEN PELVIS FINDINGS Hepatobiliary: Approximate 2.0 cm mass involving the LATERAL segment LEFT lobe of liver (series 2, image 47) demonstrating early peripheral enhancement, completely filled in on the delayed images, indicating benign hemangioma, unchanged from the prior MRI. No suspicious solid masses involving the liver. Calcified granuloma in the POSTERIOR segment RIGHT lobe. Mild intra and extrahepatic biliary ductal dilation as noted on the prior MRCP. The common bile duct can be followed to  the ampulla without evidence of obstructing stone or mass. Normal-appearing gallbladder. Pancreas: Normal in appearance without evidence of mass, ductal dilation, or inflammation. Spleen: Normal in size and appearance. Adrenals/Urinary Tract: Normal appearing adrenal glands. BILATERAL extrarenal pelves. No evidence of hydronephrosis involving either kidney. No focal parenchymal abnormality involving either kidney. No urinary tract calculi on either side. Normal appearing urinary bladder. Stomach/Bowel: Small hiatal hernia as noted above. Remainder of the stomach decompressed and unremarkable. Normal-appearing small bowel. Entire colon relatively decompressed, containing liquid stool. Scattered sigmoid colon diverticula without evidence of acute diverticulitis. Appendix not clearly visualized, though no evidence of pericecal inflammation. Vascular/Lymphatic: Moderate aortoiliac atherosclerosis without evidence of aneurysm. Normal-appearing portal venous and systemic venous systems. No pathologic lymphadenopathy. Reproductive: Normal-appearing atrophic uterus. No adnexal masses or free pelvic fluid. Other: Densely calcified injection granulomata in the RIGHT buttock. Musculoskeletal: Compression fracture of the UPPER endplate of L4 which was present on a prior MRI from 2014. Severe degenerative disc disease at L2-3. Facet degenerative changes throughout the lumbar spine. Osseous demineralization. No acute findings. IMPRESSION: CT Chest: 1. No evidence of acute traumatic injury  to the thorax. 2. 3 mm nodule involving the posteroinferior RIGHT upper lobe. No follow-up needed if patient is low-risk. Non-contrast chest CT can be considered in 12 months if patient is high-risk. This recommendation follows the consensus statement: Guidelines for Management of Incidental Pulmonary Nodules Detected on CT Images: From the Fleischner Society 2017; Radiology 2017; 284:228-243. 3.  No acute cardiopulmonary disease. CT Abdomen and  Pelvis: 1. No evidence of acute traumatic injury to the abdomen or pelvis. 2. Small hiatal hernia. 3. Scattered sigmoid colon diverticula without evidence of acute diverticulitis. 4. Benign 2 cm hemangioma involving the LEFT lobe of the liver, stable dating back to a prior MRI 06/05/2016. 5. Mild intra and extrahepatic biliary ductal dilation without evidence of obstructing stone or mass, stable dating back to the prior MRI. Aortic Atherosclerosis (ICD10-I70.0). Electronically Signed   By: Evangeline Dakin M.D.   On: 05/26/2017 15:10    Procedures Procedures (including critical care time)  Medications Ordered in ED Medications  fentaNYL (SUBLIMAZE) injection 50 mcg (50 mcg Intravenous Given 05/26/17 1307)  iopamidol (ISOVUE-300) 61 % injection 75 mL (75 mLs Intravenous Contrast Given 05/26/17 1400)     Initial Impression / Assessment and Plan / ED Course  I have reviewed the triage vital signs and the nursing notes.  Pertinent labs & imaging results that were available during my care of the patient were reviewed by me and considered in my medical decision making (see chart for details).     Patient with fall and chest abdominal tenderness.  CT scan done and reassuring.  Not a smoker.  Nodule found likely does not need follow-up.  Already on pain control at home.  No fractures or intra-abdominal injuries.  Will discharge home to follow-up with PCP as needed per Final Clinical Impressions(s) / ED Diagnoses   Final diagnoses:  Fall, initial encounter  Chest wall contusion, left, initial encounter    ED Discharge Orders    None       Davonna Belling, MD 05/26/17 1546

## 2017-05-26 NOTE — ED Triage Notes (Signed)
Pt reports falling in the shower this morning and hitting her L flank area, denies hitting head or LOC. No blood thinner use.

## 2017-06-04 DIAGNOSIS — I10 Essential (primary) hypertension: Secondary | ICD-10-CM | POA: Diagnosis not present

## 2017-06-04 DIAGNOSIS — K210 Gastro-esophageal reflux disease with esophagitis: Secondary | ICD-10-CM | POA: Diagnosis not present

## 2017-06-04 DIAGNOSIS — W19XXXA Unspecified fall, initial encounter: Secondary | ICD-10-CM | POA: Diagnosis not present

## 2017-06-04 DIAGNOSIS — R0781 Pleurodynia: Secondary | ICD-10-CM | POA: Diagnosis not present

## 2017-07-09 ENCOUNTER — Encounter (INDEPENDENT_AMBULATORY_CARE_PROVIDER_SITE_OTHER): Payer: Self-pay | Admitting: Internal Medicine

## 2017-07-09 ENCOUNTER — Ambulatory Visit (INDEPENDENT_AMBULATORY_CARE_PROVIDER_SITE_OTHER): Payer: Medicare Other | Admitting: Internal Medicine

## 2017-07-09 VITALS — BP 132/70 | HR 64 | Temp 97.7°F | Ht 59.0 in | Wt 104.4 lb

## 2017-07-09 DIAGNOSIS — K838 Other specified diseases of biliary tract: Secondary | ICD-10-CM | POA: Diagnosis not present

## 2017-07-09 LAB — HEPATIC FUNCTION PANEL
AG RATIO: 1.8 (calc) (ref 1.0–2.5)
ALKALINE PHOSPHATASE (APISO): 55 U/L (ref 33–130)
ALT: 11 U/L (ref 6–29)
AST: 20 U/L (ref 10–35)
Albumin: 3.4 g/dL — ABNORMAL LOW (ref 3.6–5.1)
BILIRUBIN INDIRECT: 0.3 mg/dL (ref 0.2–1.2)
BILIRUBIN TOTAL: 0.4 mg/dL (ref 0.2–1.2)
Bilirubin, Direct: 0.1 mg/dL (ref 0.0–0.2)
GLOBULIN: 1.9 g/dL (ref 1.9–3.7)
Total Protein: 5.3 g/dL — ABNORMAL LOW (ref 6.1–8.1)

## 2017-07-09 NOTE — Progress Notes (Signed)
Subjective:    Patient ID: [name] [name], female    DOB: [birth year], 77 y.o.   MRN: [id number]  HPI Here today for f/u. Last seen in September of 2018 for clay colored stools.  Hepatic function in September was normal.  Hepatic in February of this year was normal.  US abdomen 11/16/2016 revealed  1. Persistent dilatation of the common bile duct, not appreciably changed. No biliary duct mass or calculus evident by ultrasound. She cancelled her colonoscopy.  She tells me she fell last month and was evaluated in the ED and discharged. Her appetite is okay. Her weight is stable. Sometimes she will eat and sometimes she does. She is having a BM 2-3 a day and sometime once a day. Her stools are sometimes white in color. She says the color of her stools depend on her stools.   Her last colonoscopy was in 2007 (chronic intermittent hematochezia  3.  A cluster of small polyps at the distal sigmoid colon ablated by a cold      biopsy, suspicious for hyperplastic polyps.  4.  External hemorrhoids. Biopsy: hyperplastic     2. No liver lesions appreciable on this study. A small hemangioma in the left lobe of the liver seen on prior MR is not appreciable currently on this study. CBD 14.   06/05/2016 MRI abdomen w/wo CM:  IMPRESSION: Mild biliary ductal dilatation with common bile duct measuring 11 mm. No choledocholithiasis or other obstructing etiology identified.  1.9 cm benign hemangioma in the left hepatic lobe.  Large amount of colonic stool noted; suggest clinical correlation for possible constipation. CBC    Component Value Date/Time   WBC 6.6 05/26/2017 1308   RBC 3.92 05/26/2017 1308   HGB 12.0 05/26/2017 1308   HCT 37.4 05/26/2017 1308   PLT 212 05/26/2017 1308   MCV 95.4 05/26/2017 1308   MCH 30.6 05/26/2017 1308   MCHC 32.1 05/26/2017 1308   RDW 13.0 05/26/2017 1308   LYMPHSABS 1.5 05/26/2017 1308   MONOABS 0.6 05/26/2017 1308   EOSABS 0.1 05/26/2017 1308   BASOSABS  0.0 05/26/2017 1308     Hepatic Function Latest Ref Rng & Units 04/09/2017 11/13/2016 08/13/2013  Total Protein 6.1 - 8.1 g/dL 5.5(L) 5.4(L) 5.3(L)  Albumin 3.5 - 5.2 g/dL - - 3.5  AST 10 - 35 U/L _0 ALT 6 - 29 U/L _1 Alk Phosphatase 39 - 117 U/L - - 70  Total Bilirubin 0.2 - 1.2 mg/dL 0.4 0.4 0.6  Bilirubin, Direct 0.0 - 0.2 mg/dL 0.1 - 0.2     Her last colonoscopy was in 2007 which revealed (Hematochezia):   FINAL DIAGNOSES: 1. Examination performed to cecum. 2. A single tiny diverticulum at he sigmoid colon. 3. A cluster of small polyps at the distal sigmoid colon ablated by a cold biopsy, suspicious for hyperplastic polyps. 4. External hemorrhoids.  06/05/2016 MRI abdomen w/wo CM:  Dilated CBD, Chronic abdominal pain. Nausea with vomiting.  IMPRESSION: Mild biliary ductal dilatation with common bile duct measuring 11 mm. No choledocholithiasis or other obstructing etiology identified.  1.9 cm benign hemangioma in the left hepatic lobe.  Large amount of colonic stool noted; suggest clinical correlation for possible constipation.   08/19/2013 EGD: N, V, epigastric pain.  Symptoms x 2 months.   Impression: Small sliding hiatal hernia. Moderate amount of food debris in the stomach with patent pylorus indicative of gastroparesis. Nonerosive antral gastritis. Abnormal appearence to post bulbar mucosa. Biopsy  taken to rule out celiac disease. H. Pylori positive. Covered with pylera. Duodenal biopsy: Celiac disease Review of Systems Past Medical History:  Diagnosis Date  . Anxiety   . Depression   . GERD (gastroesophageal reflux disease)   . Hypercholesteremia   . Hypertension     Past Surgical History:  Procedure Laterality Date  . BREAST REDUCTION SURGERY    . CESAREAN SECTION    . COLONOSCOPY  08/2005  . ESOPHAGOGASTRODUODENOSCOPY N/A 08/19/2013   Procedure: ESOPHAGOGASTRODUODENOSCOPY (EGD);  Surgeon: Rogene Houston, MD;   Location: [facility];  Service: Endoscopy;  Laterality: N/A;  11:15 - rescheduled to 7:30 - Ann notified pt  . excision of submandibular gland    . HEMORRHOID SURGERY    . STOMACH SURGERY     years ago. ? reason  . TONSILLECTOMY      Allergies  Allergen Reactions  . Morphine And Related Other (See Comments)    Patient stated that she thinks that morphine was one of the medication that she had a bad reaction to. She can not remember the other. She said that she blacked out when she took it.  . Other     Allergic to 2 meds, does not know what they are.    Current Outpatient Medications on File Prior to Visit  Medication Sig Dispense Refill  . alendronate (FOSAMAX) 70 MG tablet Take 70 mg by mouth once a week. Take with a full glass of water on an empty stomach.    Marland Kitchen atenolol (TENORMIN) 50 MG tablet Take 50 mg by mouth daily.    . celecoxib (CELEBREX) 100 MG capsule TAKE ONE CAPSULE BY MOUTH DAILY. 30 capsule 2  . clobetasol cream (TEMOVATE) 6.62 % Apply 1 application topically 2 (two) times daily.     . Cyanocobalamin (VITAMIN B 12 PO) Take 1 tablet by mouth daily.    . diphenhydrAMINE (BENADRYL) 25 MG tablet Take 25 mg by mouth every 6 (six) hours as needed for allergies.    Marland Kitchen esomeprazole (NEXIUM) 40 MG capsule Take 40 mg by mouth daily at 12 noon.    Marland Kitchen LORazepam (ATIVAN) 1 MG tablet Take 1 mg by mouth 2 (two) times daily. scheduled    . Multiple Vitamin (MULTIVITAMIN WITH MINERALS) TABS Take 1 tablet by mouth daily.    Marland Kitchen omega-3 acid ethyl esters (LOVAZA) 1 G capsule Take 1 g by mouth daily.    Marland Kitchen omeprazole (PRILOSEC) 40 MG capsule Take 1 capsule (40 mg total) by mouth daily. 90 capsule 3  . oxyCODONE-acetaminophen (PERCOCET) 10-325 MG tablet Take 1 tablet by mouth every 4 (four) hours as needed for pain.    Marland Kitchen QUEtiapine (SEROQUEL) 25 MG tablet Take 25 mg by mouth at bedtime.    . simvastatin (ZOCOR) 40 MG tablet Take 40 mg by mouth at bedtime.    Marland Kitchen tiZANidine (ZANAFLEX) 4 MG  tablet Take 4 mg by mouth every 8 (eight) hours as needed for muscle spasms.    Marland Kitchen zolpidem (AMBIEN) 10 MG tablet Take 10 mg by mouth at bedtime as needed for sleep.     No current facility-administered medications on file prior to visit.         Objective:   Physical Exam Blood pressure 132/70, pulse 64, temperature 97.7 F (36.5 C), height _0  (1.499 m), weight 104 lb 6.4 oz (47.4 kg). Alert and oriented. Skin warm and dry. Oral mucosa is moist.   . Sclera anicteric, conjunctivae is pink. Thyroid not  enlarged. No cervical lymphadenopathy. Lungs clear. Heart regular rate and rhythm.  Abdomen is soft. Bowel sounds are positive. No hepatomegaly. No abdominal masses felt. No tenderness.  No edema to lower extremities.            Assessment & Plan:  Dilated CBD. Liver enzymes are normal. MRI in April of last year revealed a CBD 11 mm.  Will repeat an Hepatic function. OV in 1 year.

## 2017-08-20 DIAGNOSIS — R109 Unspecified abdominal pain: Secondary | ICD-10-CM | POA: Diagnosis not present

## 2017-08-20 DIAGNOSIS — M545 Low back pain: Secondary | ICD-10-CM | POA: Diagnosis not present

## 2017-08-20 DIAGNOSIS — K210 Gastro-esophageal reflux disease with esophagitis: Secondary | ICD-10-CM | POA: Diagnosis not present

## 2017-08-20 DIAGNOSIS — I10 Essential (primary) hypertension: Secondary | ICD-10-CM | POA: Diagnosis not present

## 2017-11-19 ENCOUNTER — Encounter: Payer: Self-pay | Admitting: Adult Health

## 2017-11-22 DIAGNOSIS — R21 Rash and other nonspecific skin eruption: Secondary | ICD-10-CM | POA: Diagnosis not present

## 2017-11-26 DIAGNOSIS — M545 Low back pain: Secondary | ICD-10-CM | POA: Diagnosis not present

## 2017-11-26 DIAGNOSIS — R21 Rash and other nonspecific skin eruption: Secondary | ICD-10-CM | POA: Diagnosis not present

## 2017-11-26 DIAGNOSIS — Z23 Encounter for immunization: Secondary | ICD-10-CM | POA: Diagnosis not present

## 2017-11-26 DIAGNOSIS — I10 Essential (primary) hypertension: Secondary | ICD-10-CM | POA: Diagnosis not present

## 2017-11-26 DIAGNOSIS — M129 Arthropathy, unspecified: Secondary | ICD-10-CM | POA: Diagnosis not present

## 2017-12-03 ENCOUNTER — Other Ambulatory Visit (HOSPITAL_COMMUNITY)
Admission: RE | Admit: 2017-12-03 | Discharge: 2017-12-03 | Disposition: A | Discharge status: Home / Self Care | Payer: Medicare Other | Source: Ambulatory Visit | Attending: Adult Health | Admitting: Adult Health

## 2017-12-03 ENCOUNTER — Ambulatory Visit (INDEPENDENT_AMBULATORY_CARE_PROVIDER_SITE_OTHER): Payer: Medicare Other | Admitting: Adult Health

## 2017-12-03 ENCOUNTER — Encounter: Payer: Self-pay | Admitting: Adult Health

## 2017-12-03 VITALS — BP 153/97 | HR 69 | Ht 59.0 in | Wt 99.0 lb

## 2017-12-03 DIAGNOSIS — E785 Hyperlipidemia, unspecified: Secondary | ICD-10-CM | POA: Diagnosis not present

## 2017-12-03 DIAGNOSIS — Z124 Encounter for screening for malignant neoplasm of cervix: Secondary | ICD-10-CM | POA: Insufficient documentation

## 2017-12-03 DIAGNOSIS — K210 Gastro-esophageal reflux disease with esophagitis: Secondary | ICD-10-CM | POA: Diagnosis not present

## 2017-12-03 DIAGNOSIS — N3941 Urge incontinence: Secondary | ICD-10-CM

## 2017-12-03 DIAGNOSIS — Z1212 Encounter for screening for malignant neoplasm of rectum: Secondary | ICD-10-CM | POA: Diagnosis not present

## 2017-12-03 DIAGNOSIS — K900 Celiac disease: Secondary | ICD-10-CM | POA: Diagnosis not present

## 2017-12-03 DIAGNOSIS — G5601 Carpal tunnel syndrome, right upper limb: Secondary | ICD-10-CM | POA: Diagnosis not present

## 2017-12-03 DIAGNOSIS — Z1211 Encounter for screening for malignant neoplasm of colon: Secondary | ICD-10-CM | POA: Diagnosis not present

## 2017-12-03 DIAGNOSIS — N3946 Mixed incontinence: Secondary | ICD-10-CM | POA: Insufficient documentation

## 2017-12-03 DIAGNOSIS — R413 Other amnesia: Secondary | ICD-10-CM | POA: Diagnosis not present

## 2017-12-03 LAB — HEMOCCULT GUIAC POC 1CARD (OFFICE): Fecal Occult Blood, POC: NEGATIVE

## 2017-12-03 MED ORDER — SOLIFENACIN SUCCINATE 5 MG PO TABS: 5.0000 mg | ORAL_TABLET | Freq: Every day | ORAL | 6 refills | Status: DC
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
## 2017-12-05 LAB — CYTOLOGY - PAP
DIAGNOSIS: NEGATIVE
HPV (WINDOPATH): NOT DETECTED

## 2018-02-25 DIAGNOSIS — M545 Low back pain: Secondary | ICD-10-CM | POA: Diagnosis not present

## 2018-02-25 DIAGNOSIS — I10 Essential (primary) hypertension: Secondary | ICD-10-CM | POA: Diagnosis not present

## 2018-02-25 DIAGNOSIS — K210 Gastro-esophageal reflux disease with esophagitis: Secondary | ICD-10-CM | POA: Diagnosis not present

## 2018-02-25 DIAGNOSIS — G4700 Insomnia, unspecified: Secondary | ICD-10-CM | POA: Diagnosis not present

## 2018-07-10 ENCOUNTER — Ambulatory Visit (INDEPENDENT_AMBULATORY_CARE_PROVIDER_SITE_OTHER): Payer: Medicare Other | Admitting: Internal Medicine

## 2018-07-10 ENCOUNTER — Other Ambulatory Visit: Payer: Self-pay

## 2018-07-10 ENCOUNTER — Encounter (INDEPENDENT_AMBULATORY_CARE_PROVIDER_SITE_OTHER): Payer: Self-pay | Admitting: Internal Medicine

## 2018-07-10 DIAGNOSIS — K219 Gastro-esophageal reflux disease without esophagitis: Secondary | ICD-10-CM

## 2018-07-10 NOTE — Addendum Note (Signed)
Addended by: Butch Penny on: 07/10/2018 10:28 AM   Modules accepted: Level of Service

## 2018-07-10 NOTE — Progress Notes (Addendum)
Subjective:    Patient ID: [name] [name], female    DOB: 1940/12/18, 78 y.o.   MRN: [id number]  HPI PCP Dr. Luan Pulling. Start time 10:18am. End time: 1028am total time 10 minutes.  This is a telephone OV. Patient consents to this OV. She is at home. I am in the office. Unable to do video OV. Telephone OV due to COVID-19 virus. This is a follow up OV. Last seen in May of 2019.  Hx of Dilated CBD. Underwent a CT abdomen/pelvis with CM in April of 2019 (fall) which revealed 5. Mild intra and extrahepatic biliary ductal dilation without evidence of obstructing stone or mass, stable dating back to the prior MRI. She tells me she is doing good. Her appetite is okay.Marland Kitchen Her stated weight today is 104. Her last weight in May of 2019 was 104. She denies any abdominal pain. Her BMs move every day. No melena or BRRB. Her stools are brown in color. She is eating 3 meals. Her GERD is controlled with Nexium. She really not leaving her house because of the virus.  For the most part she feels good. She denies any fever, chills, or cough.     Review of Systems Past Medical History:  Diagnosis Date  . Anxiety   . Depression   . GERD (gastroesophageal reflux disease)   . Hypercholesteremia   . Hypertension     Past Surgical History:  Procedure Laterality Date  . BREAST REDUCTION SURGERY    . CESAREAN SECTION    . COLONOSCOPY  08/2005  . ESOPHAGOGASTRODUODENOSCOPY N/A 08/19/2013   Procedure: ESOPHAGOGASTRODUODENOSCOPY (EGD);  Surgeon: Rogene Houston, MD;  Location: [facility];  Service: Endoscopy;  Laterality: N/A;  11:15 - rescheduled to 7:30 - Ann notified pt  . excision of submandibular gland    . HEMORRHOID SURGERY    . STOMACH SURGERY     years ago. ? reason  . TONSILLECTOMY      Allergies  Allergen Reactions  . Morphine And Related Other (See Comments)    Patient stated that she thinks that morphine was one of the medication that she had a bad reaction to. She can not remember the  other. She said that she blacked out when she took it.  . Other     Allergic to  med, does not know name    Current Outpatient Medications on File Prior to Visit  Medication Sig Dispense Refill  . alendronate (FOSAMAX) 70 MG tablet Take 70 mg by mouth once a week. Take with a full glass of water on an empty stomach.    Marland Kitchen atenolol (TENORMIN) 50 MG tablet Take 50 mg by mouth daily.    Marland Kitchen CALCIUM PO Take 600 mg by mouth daily.    . clobetasol cream (TEMOVATE) 7.91 % Apply 1 application topically 2 (two) times daily.     . diphenhydrAMINE (BENADRYL) 25 MG tablet Take 25 mg by mouth every 6 (six) hours as needed for allergies.    Marland Kitchen esomeprazole (NEXIUM) 40 MG capsule Take 40 mg by mouth daily at 12 noon.    Marland Kitchen LORazepam (ATIVAN) 1 MG tablet Take 1 mg by mouth 4 (four) times daily. scheduled    . oxyCODONE-acetaminophen (PERCOCET/ROXICET) 5-325 MG tablet Take 1 tablet by mouth 2 (two) times a day.     . Pancrelipase, Lip-Prot-Amyl, (ZENPEP PO) Take by mouth 3 (three) times daily.    . simvastatin (ZOCOR) 40 MG tablet Take 40 mg by mouth at bedtime.    Marland Kitchen  solifenacin (VESICARE) 5 MG tablet Take 1 tablet (5 mg total) by mouth daily. (Patient taking differently: Take 5 mg by mouth 2 (two) times a day. ) 30 tablet 6  . tiZANidine (ZANAFLEX) 4 MG tablet Take 4 mg by mouth every 8 (eight) hours as needed for muscle spasms.    Marland Kitchen zolpidem (AMBIEN) 10 MG tablet Take 5 mg by mouth at bedtime.      No current facility-administered medications on file prior to visit.         Objective:   Physical Exam Deferred.       Assessment & Plan:  GERD. She will continue the Nexium. She has no GI complaints.  She will have OV in 1 year.

## 2018-07-10 NOTE — Patient Instructions (Signed)
Continue the Nexium. OV in 1 year. 

## 2018-07-13 ENCOUNTER — Inpatient Hospital Stay (HOSPITAL_COMMUNITY)
Admission: EM | Admit: 2018-07-13 | Discharge: 2018-07-17 | DRG: 481 | Disposition: A | Discharge status: Skilled Nursing Facility | Payer: Medicare Other | Attending: Internal Medicine | Admitting: Internal Medicine

## 2018-07-13 ENCOUNTER — Encounter (HOSPITAL_COMMUNITY): Payer: Self-pay

## 2018-07-13 ENCOUNTER — Other Ambulatory Visit: Payer: Self-pay

## 2018-07-13 ENCOUNTER — Emergency Department (HOSPITAL_COMMUNITY): Payer: Medicare Other

## 2018-07-13 DIAGNOSIS — R61 Generalized hyperhidrosis: Secondary | ICD-10-CM | POA: Diagnosis not present

## 2018-07-13 DIAGNOSIS — G8929 Other chronic pain: Secondary | ICD-10-CM | POA: Diagnosis not present

## 2018-07-13 DIAGNOSIS — D62 Acute posthemorrhagic anemia: Secondary | ICD-10-CM | POA: Diagnosis not present

## 2018-07-13 DIAGNOSIS — S72002S Fracture of unspecified part of neck of left femur, sequela: Secondary | ICD-10-CM | POA: Diagnosis not present

## 2018-07-13 DIAGNOSIS — Y733 Surgical instruments, materials and gastroenterology and urology devices (including sutures) associated with adverse incidents: Secondary | ICD-10-CM

## 2018-07-13 DIAGNOSIS — W010XXA Fall on same level from slipping, tripping and stumbling without subsequent striking against object, initial encounter: Secondary | ICD-10-CM | POA: Diagnosis not present

## 2018-07-13 DIAGNOSIS — S0990XA Unspecified injury of head, initial encounter: Secondary | ICD-10-CM | POA: Diagnosis not present

## 2018-07-13 DIAGNOSIS — S72102D Unspecified trochanteric fracture of left femur, subsequent encounter for closed fracture with routine healing: Secondary | ICD-10-CM | POA: Diagnosis not present

## 2018-07-13 DIAGNOSIS — E7800 Pure hypercholesterolemia, unspecified: Secondary | ICD-10-CM | POA: Diagnosis present

## 2018-07-13 DIAGNOSIS — Z03818 Encounter for observation for suspected exposure to other biological agents ruled out: Secondary | ICD-10-CM | POA: Diagnosis not present

## 2018-07-13 DIAGNOSIS — R262 Difficulty in walking, not elsewhere classified: Secondary | ICD-10-CM | POA: Diagnosis not present

## 2018-07-13 DIAGNOSIS — Z7983 Long term (current) use of bisphosphonates: Secondary | ICD-10-CM | POA: Diagnosis not present

## 2018-07-13 DIAGNOSIS — K219 Gastro-esophageal reflux disease without esophagitis: Secondary | ICD-10-CM | POA: Diagnosis present

## 2018-07-13 DIAGNOSIS — N179 Acute kidney failure, unspecified: Secondary | ICD-10-CM | POA: Diagnosis not present

## 2018-07-13 DIAGNOSIS — E871 Hypo-osmolality and hyponatremia: Secondary | ICD-10-CM | POA: Diagnosis not present

## 2018-07-13 DIAGNOSIS — Z7401 Bed confinement status: Secondary | ICD-10-CM | POA: Diagnosis not present

## 2018-07-13 DIAGNOSIS — E785 Hyperlipidemia, unspecified: Secondary | ICD-10-CM | POA: Diagnosis not present

## 2018-07-13 DIAGNOSIS — M6281 Muscle weakness (generalized): Secondary | ICD-10-CM | POA: Diagnosis not present

## 2018-07-13 DIAGNOSIS — J80 Acute respiratory distress syndrome: Secondary | ICD-10-CM | POA: Diagnosis not present

## 2018-07-13 DIAGNOSIS — Z741 Need for assistance with personal care: Secondary | ICD-10-CM | POA: Diagnosis not present

## 2018-07-13 DIAGNOSIS — Z885 Allergy status to narcotic agent status: Secondary | ICD-10-CM

## 2018-07-13 DIAGNOSIS — F419 Anxiety disorder, unspecified: Secondary | ICD-10-CM | POA: Diagnosis present

## 2018-07-13 DIAGNOSIS — S299XXA Unspecified injury of thorax, initial encounter: Secondary | ICD-10-CM | POA: Diagnosis not present

## 2018-07-13 DIAGNOSIS — Z79899 Other long term (current) drug therapy: Secondary | ICD-10-CM

## 2018-07-13 DIAGNOSIS — Z1159 Encounter for screening for other viral diseases: Secondary | ICD-10-CM | POA: Diagnosis not present

## 2018-07-13 DIAGNOSIS — S72142S Displaced intertrochanteric fracture of left femur, sequela: Secondary | ICD-10-CM | POA: Diagnosis not present

## 2018-07-13 DIAGNOSIS — M25552 Pain in left hip: Secondary | ICD-10-CM | POA: Diagnosis present

## 2018-07-13 DIAGNOSIS — W19XXXA Unspecified fall, initial encounter: Secondary | ICD-10-CM | POA: Diagnosis not present

## 2018-07-13 DIAGNOSIS — Z791 Long term (current) use of non-steroidal anti-inflammatories (NSAID): Secondary | ICD-10-CM

## 2018-07-13 DIAGNOSIS — I10 Essential (primary) hypertension: Secondary | ICD-10-CM | POA: Diagnosis present

## 2018-07-13 DIAGNOSIS — Y92512 Supermarket, store or market as the place of occurrence of the external cause: Secondary | ICD-10-CM | POA: Diagnosis not present

## 2018-07-13 DIAGNOSIS — S79929A Unspecified injury of unspecified thigh, initial encounter: Secondary | ICD-10-CM | POA: Diagnosis not present

## 2018-07-13 DIAGNOSIS — S72142A Displaced intertrochanteric fracture of left femur, initial encounter for closed fracture: Secondary | ICD-10-CM | POA: Diagnosis not present

## 2018-07-13 DIAGNOSIS — M2550 Pain in unspecified joint: Secondary | ICD-10-CM | POA: Diagnosis not present

## 2018-07-13 DIAGNOSIS — S72009A Fracture of unspecified part of neck of unspecified femur, initial encounter for closed fracture: Secondary | ICD-10-CM

## 2018-07-13 DIAGNOSIS — Z9181 History of falling: Secondary | ICD-10-CM | POA: Diagnosis not present

## 2018-07-13 DIAGNOSIS — S72002A Fracture of unspecified part of neck of left femur, initial encounter for closed fracture: Secondary | ICD-10-CM | POA: Diagnosis not present

## 2018-07-13 DIAGNOSIS — S72145A Nondisplaced intertrochanteric fracture of left femur, initial encounter for closed fracture: Secondary | ICD-10-CM | POA: Diagnosis not present

## 2018-07-13 DIAGNOSIS — Z4789 Encounter for other orthopedic aftercare: Secondary | ICD-10-CM | POA: Diagnosis not present

## 2018-07-13 DIAGNOSIS — R52 Pain, unspecified: Secondary | ICD-10-CM | POA: Diagnosis not present

## 2018-07-13 DIAGNOSIS — M8000XS Age-related osteoporosis with current pathological fracture, unspecified site, sequela: Secondary | ICD-10-CM | POA: Diagnosis not present

## 2018-07-13 DIAGNOSIS — M5416 Radiculopathy, lumbar region: Secondary | ICD-10-CM | POA: Diagnosis not present

## 2018-07-13 LAB — CBC WITH DIFFERENTIAL/PLATELET
Abs Immature Granulocytes: 0.05 10*3/uL (ref 0.00–0.07)
Basophils Absolute: 0 10*3/uL (ref 0.0–0.1)
Basophils Relative: 0 %
Eosinophils Absolute: 0.1 10*3/uL (ref 0.0–0.5)
Eosinophils Relative: 1 %
HCT: 35.6 % — ABNORMAL LOW (ref 36.0–46.0)
Hemoglobin: 11.6 g/dL — ABNORMAL LOW (ref 12.0–15.0)
Immature Granulocytes: 1 %
Lymphocytes Relative: 21 %
Lymphs Abs: 1.9 10*3/uL (ref 0.7–4.0)
MCH: 32.9 pg (ref 26.0–34.0)
MCHC: 32.6 g/dL (ref 30.0–36.0)
MCV: 100.8 fL — ABNORMAL HIGH (ref 80.0–100.0)
Monocytes Absolute: 0.8 10*3/uL (ref 0.1–1.0)
Monocytes Relative: 9 %
Neutro Abs: 6.3 10*3/uL (ref 1.7–7.7)
Neutrophils Relative %: 68 %
Platelets: 194 10*3/uL (ref 150–400)
RBC: 3.53 MIL/uL — ABNORMAL LOW (ref 3.87–5.11)
RDW: 12.2 % (ref 11.5–15.5)
WBC: 9.1 10*3/uL (ref 4.0–10.5)
nRBC: 0 % (ref 0.0–0.2)

## 2018-07-13 LAB — BASIC METABOLIC PANEL
Anion gap: 7 (ref 5–15)
BUN: 29 mg/dL — ABNORMAL HIGH (ref 8–23)
CO2: 21 mmol/L — ABNORMAL LOW (ref 22–32)
Calcium: 7.8 mg/dL — ABNORMAL LOW (ref 8.9–10.3)
Chloride: 101 mmol/L (ref 98–111)
Creatinine, Ser: 1.18 mg/dL — ABNORMAL HIGH (ref 0.44–1.00)
GFR calc Af Amer: 51 mL/min — ABNORMAL LOW (ref >60)
GFR calc non Af Amer: 44 mL/min — ABNORMAL LOW (ref >60)
Glucose, Bld: 105 mg/dL — ABNORMAL HIGH (ref 70–99)
Potassium: 5.4 mmol/L — ABNORMAL HIGH (ref 3.5–5.1)
Sodium: 129 mmol/L — ABNORMAL LOW (ref 135–145)

## 2018-07-13 MED ORDER — FENTANYL CITRATE (PF) 100 MCG/2ML IJ SOLN
100.0000 ug | Freq: Once | INTRAMUSCULAR | Status: AC
  Administered 2018-07-13: 100 ug via INTRAVENOUS
  Filled 2018-07-13: qty 2

## 2018-07-13 MED ORDER — ONDANSETRON HCL 4 MG/2ML IJ SOLN
4.0000 mg | Freq: Once | INTRAMUSCULAR | Status: AC
  Administered 2018-07-13: 4 mg via INTRAVENOUS
  Filled 2018-07-13: qty 2

## 2018-07-13 MED ORDER — FENTANYL CITRATE (PF) 100 MCG/2ML IJ SOLN: 50.0000 ug | Freq: Once | INTRAMUSCULAR | Status: DC

## 2018-07-13 NOTE — ED Notes (Signed)
Bed: WA07 Expected date:  Expected time:  Means of arrival:  Comments: 36F fall hip pain and deformity

## 2018-07-13 NOTE — ED Notes (Signed)
Patient transported to X-ray 

## 2018-07-13 NOTE — ED Provider Notes (Addendum)
[facility] DEPT Provider Note   CSN: [id number] Arrival date & time: 07/13/18  2113    History   Chief Complaint Chief Complaint  Patient presents with  . Fall  . Hip Pain    HPI [name] [name] is a 78 y.o. female past 1 history anxiety, depression, GERD, hypertension brought in by EMS who presents for evaluation of mechanical fall that occurred just prior to ED arrival.  Patient reports she was returning a shopping cart when she tripped and fell, causing her to land backwards.  She reports landing on her left leg and felt sending her head.  She denies any LOC.  She is not on any blood thinners.  Patient reports pain to the left hip.  She has not been able to ambulate or bear weight since the incident.  Patient states she did not have any preceding chest pain or dizziness that caused her to fall.  Patient denies any vision changes, numbness/weakness of his arms or legs, abdominal pain, nausea/vomiting, chest pain, difficulty breathing.     The history is provided by the patient.    Past Medical History:  Diagnosis Date  . Anxiety   . Depression   . GERD (gastroesophageal reflux disease)   . Hypercholesteremia   . Hypertension     Patient Active Problem List   Diagnosis Date Noted  . Hip fracture ([place]) 07/13/2018  . Hypertension   . Hypercholesteremia   . Routine cervical smear 12/03/2017  . Screening for colorectal cancer 12/03/2017  . Mixed stress and urge urinary incontinence 12/03/2017  . GERD (gastroesophageal reflux disease) 04/09/2017  . Encounter for screening colonoscopy 11/22/2016  . Common bile duct dilatation 08/13/2013  . Nausea with vomiting 08/13/2013  . Bilateral leg weakness 06/24/2013  . Posture imbalance 06/24/2013  . Chronic radicular low back pain 06/24/2013    Past Surgical History:  Procedure Laterality Date  . BREAST REDUCTION SURGERY    . CESAREAN SECTION    . COLONOSCOPY  08/2005  .  ESOPHAGOGASTRODUODENOSCOPY N/A 08/19/2013   Procedure: ESOPHAGOGASTRODUODENOSCOPY (EGD);  Surgeon: Rogene Houston, MD;  Location: [facility];  Service: Endoscopy;  Laterality: N/A;  11:15 - rescheduled to 7:30 - Ann notified pt  . excision of submandibular gland    . HEMORRHOID SURGERY    . STOMACH SURGERY     years ago. ? reason  . TONSILLECTOMY       OB History    Gravida  2   Para      Term      Preterm      AB      Living  1     SAB      TAB      Ectopic      Multiple      Live Births               Home Medications    Prior to Admission medications   Medication Sig Start Date End Date Taking? Authorizing Provider  alendronate (FOSAMAX) 70 MG tablet Take 70 mg by mouth once a week. Take with a full glass of water on an empty stomach. 03/17/13  Yes [provider]  atenolol (TENORMIN) 50 MG tablet Take 50 mg by mouth daily. 05/15/13  Yes [provider]  celecoxib (CELEBREX) 200 MG capsule Take 200 mg by mouth daily after breakfast. 05/04/18  Yes [provider]  diphenhydrAMINE (BENADRYL) 25 MG tablet Take 25 mg by mouth every  6 (six) hours as needed for allergies.   Yes [provider]  esomeprazole (NEXIUM) 40 MG capsule Take 40 mg by mouth daily after breakfast.    Yes [provider]  LORazepam (ATIVAN) 1 MG tablet Take 1 mg by mouth 4 (four) times daily. scheduled 05/21/12  Yes [provider]  oxybutynin (DITROPAN) 5 MG tablet Take 5 mg by mouth 2 (two) times a day. 06/06/18  Yes [provider]  oxyCODONE-acetaminophen (PERCOCET/ROXICET) 5-325 MG tablet Take 1 tablet by mouth 2 (two) times a day.    Yes [provider]  simvastatin (ZOCOR) 40 MG tablet Take 40 mg by mouth at bedtime. 05/15/13  Yes [provider]  tiZANidine (ZANAFLEX) 4 MG tablet Take 4 mg by mouth every 8 (eight) hours as needed for muscle spasms. 12/21/12  Yes [provider]  zolpidem (AMBIEN) 10 MG  tablet Take 7.5 mg by mouth at bedtime.    Yes [provider]  enoxaparin (LOVENOX) 40 MG/0.4ML injection Inject 0.4 mLs (40 mg total) into the skin daily for 30 doses. For 30 days post op for DVT prophylaxis 07/14/18 08/13/18  Prudencio Burly III, PA-C  HYDROcodone-acetaminophen (NORCO) 5-325 MG tablet Take 1 tablet by mouth every 4 (four) hours as needed for severe pain (Use Tylenol for mild / moderate pain.). 07/14/18   Prudencio Burly III, PA-C  solifenacin (VESICARE) 5 MG tablet Take 1 tablet (5 mg total) by mouth daily. Patient not taking: Reported on 07/13/2018 12/03/17   Estill Dooms, NP    Family History Family History  Problem Relation Age of Onset  . Sleep apnea Son   . Diabetes Son     Social History Social History   Tobacco Use  . Smoking status: Never Smoker  . Smokeless tobacco: Never Used  Substance Use Topics  . Alcohol use: No    Alcohol/week: 0.0 standard drinks  . Drug use: No     Allergies   Morphine and related and Other   Review of Systems Review of Systems  Constitutional: Negative for fever.  Respiratory: Negative for shortness of breath.   Cardiovascular: Negative for chest pain.  Gastrointestinal: Negative for abdominal pain, nausea and vomiting.  Genitourinary: Negative for dysuria and hematuria.  Musculoskeletal:       Left hip pain  Neurological: Negative for numbness and headaches.  All other systems reviewed and are negative.    Physical Exam Updated Vital Signs BP 132/82 (BP Location: Left Arm)   Pulse 69   Temp 98.9 F (37.2 C) (Oral)   Resp 16   Ht 4\' 11"  (1.499 m)   Wt 44.9 kg   SpO2 99%   BMI 20.00 kg/m   Physical Exam Vitals signs and nursing note reviewed.  Constitutional:      Appearance: Normal appearance. She is well-developed.  HENT:     Head: Normocephalic and atraumatic.     Comments: No tenderness to palpation of skull. No deformities or crepitus noted. No open wounds, abrasions  or lacerations.  Eyes:     General: Lids are normal.     Conjunctiva/sclera: Conjunctivae normal.     Pupils: Pupils are equal, round, and reactive to light.     Comments: PERRL. EOMs intact.  Neck:     Musculoskeletal: Full passive range of motion without pain.     Comments: Full flexion/extension and lateral movement of neck fully intact. No bony midline tenderness. No deformities or crepitus.  Cardiovascular:  Rate and Rhythm: Normal rate and regular rhythm.     Pulses: Normal pulses.          Radial pulses are 2+ on the right side and 2+ on the left side.       Dorsalis pedis pulses are 2+ on the right side and 2+ on the left side.     Heart sounds: Normal heart sounds. No murmur. No friction rub. No gallop.   Pulmonary:     Effort: Pulmonary effort is normal.     Breath sounds: Normal breath sounds.     Comments: Lungs clear to auscultation bilaterally.  Symmetric chest rise.  No wheezing, rales, rhonchi. Abdominal:     Palpations: Abdomen is soft. Abdomen is not rigid.     Tenderness: There is no abdominal tenderness. There is no guarding.     Comments: Abdomen is soft, non-distended, non-tender. No rigidity, No guarding. No peritoneal signs.  Musculoskeletal: Normal range of motion.     Comments: Tenderness palpation noted to left hip.  Deformity noted.  Left lower extremity is shortened and rotated.  No bony tenderness noted to left knee, left tib-fib, left ankle.  No tenderness palpation noted to right lower extremity. No tenderness to palpation to bilateral shoulders, clavicles, elbows, and wrists. No deformities or crepitus noted. FROM of BUE without difficulty.   Skin:    General: Skin is warm and dry.     Capillary Refill: Capillary refill takes less than 2 seconds.     Comments: Good distal cap refill. LLE is not dusky in appearance or cool to touch.  Neurological:     Mental Status: She is alert and oriented to person, place, and time.     Comments: Sensation intact  along major nerve distributions of LLE  Psychiatric:        Speech: Speech normal.      ED Treatments / Results  Labs (all labs ordered are listed, but only abnormal results are displayed) Labs Reviewed  BASIC METABOLIC PANEL - Abnormal; Notable for the following components:      Result Value   Sodium 129 (*)    Potassium 5.4 (*)    CO2 21 (*)    Glucose, Bld 105 (*)    BUN 29 (*)    Creatinine, Ser 1.18 (*)    Calcium 7.8 (*)    GFR calc non Af Amer 44 (*)    GFR calc Af Amer 51 (*)    All other components within normal limits  CBC WITH DIFFERENTIAL/PLATELET - Abnormal; Notable for the following components:   RBC 3.53 (*)    Hemoglobin 11.6 (*)    HCT 35.6 (*)    MCV 100.8 (*)    All other components within normal limits  CBC - Abnormal; Notable for the following components:   WBC 13.7 (*)    RBC 3.40 (*)    Hemoglobin 11.0 (*)    HCT 33.2 (*)    All other components within normal limits  CREATININE, SERUM - Abnormal; Notable for the following components:   Creatinine, Ser 1.11 (*)    GFR calc non Af Amer 48 (*)    GFR calc Af Amer 55 (*)    All other components within normal limits  SARS CORONAVIRUS 2 (HOSPITAL ORDER, [facility])  SURGICAL PCR SCREEN  CBC  BASIC METABOLIC PANEL  TYPE AND SCREEN  TYPE AND SCREEN  ABO/RH    EKG EKG Interpretation  Date/Time:  Saturday  Jul 13 2018 23:13:57 EDT Ventricular Rate:  73 PR Interval:    QRS Duration: 78 QT Interval:  393 QTC Calculation: 433 R Axis:   -40 Text Interpretation:  Sinus rhythm Left axis deviation Abnormal R-wave progression, early transition Confirmed by Thayer Jew [id number]) on 07/13/2018 11:47:05 PM   Radiology Dg Chest 2 View  Result Date: 07/13/2018 CLINICAL DATA:  Recent fall with known left femoral fracture EXAM: CHEST - 2 VIEW COMPARISON:  03/05/2014, CT from 05/26/2017 FINDINGS: Cardiac shadow is within normal limits. Small hiatal hernia is again noted. Lungs  are well aerated bilaterally. No focal infiltrate or effusion is seen. No pneumothorax is noted. No acute bony abnormality is seen. IMPRESSION: Small sliding-type hiatal hernia.  No acute abnormality noted. Electronically Signed   By: Inez Catalina M.D.   On: 07/13/2018 22:56   Ct Head Wo Contrast  Result Date: 07/13/2018 CLINICAL DATA:  Fall with head trauma. EXAM: CT HEAD WITHOUT CONTRAST TECHNIQUE: Contiguous axial images were obtained from the base of the skull through the vertex without intravenous contrast. COMPARISON:  04/28/2012 FINDINGS: Brain: There is no evidence for acute hemorrhage, hydrocephalus, mass lesion, or abnormal extra-axial fluid collection. No definite CT evidence for acute infarction. Vascular: No hyperdense vessel or unexpected calcification. Skull: No evidence for fracture. No worrisome lytic or sclerotic lesion. Sinuses/Orbits: The visualized paranasal sinuses and mastoid air cells are clear. Visualized portions of the globes and intraorbital fat are unremarkable. Other: None. IMPRESSION: 1. Stable exam.  No acute intracranial abnormality. Electronically Signed   By: Misty Stanley M.D.   On: 07/13/2018 23:07   Dg C-arm 1-60 Min  Result Date: 07/14/2018 CLINICAL DATA:  78 year old female with fracture EXAM: DG C-ARM 61-120 MIN; LEFT FEMUR 2 VIEWS COMPARISON:  07/13/2018 FINDINGS: Multiple intraoperative fluoroscopic spot images demonstrate ORIF of left hip fracture with antegrade intramedullary rod and cannulated screw. IMPRESSION: Limited intraoperative fluoroscopic spot images demonstrating ORIF of left hip. Please refer to the dictated operative report for full details of intraoperative findings and procedure. Electronically Signed   By: Corrie Mckusick D.O.   On: 07/14/2018 09:23   Dg Hip Unilat W Or Wo Pelvis 2-3 Views Left  Result Date: 07/13/2018 CLINICAL DATA:  Recent fall with left hip pain, initial encounter EXAM: DG HIP (WITH OR WITHOUT PELVIS) 3V LEFT COMPARISON:   None. FINDINGS: Comminuted intratrochanteric left femoral fracture is noted with impaction at the fracture site. Femoral head is well seated. Pelvic ring is intact. No soft tissue abnormality is noted. IMPRESSION: Comminuted intratrochanteric left femoral fracture. Electronically Signed   By: Inez Catalina M.D.   On: 07/13/2018 22:55   Dg Femur Min 2 Views Left  Result Date: 07/14/2018 CLINICAL DATA:  78 year old female with fracture EXAM: DG C-ARM 61-120 MIN; LEFT FEMUR 2 VIEWS COMPARISON:  07/13/2018 FINDINGS: Multiple intraoperative fluoroscopic spot images demonstrate ORIF of left hip fracture with antegrade intramedullary rod and cannulated screw. IMPRESSION: Limited intraoperative fluoroscopic spot images demonstrating ORIF of left hip. Please refer to the dictated operative report for full details of intraoperative findings and procedure. Electronically Signed   By: Corrie Mckusick D.O.   On: 07/14/2018 09:23    Procedures Procedures (including critical care time)  Medications Ordered in ED Medications  methocarbamol (ROBAXIN) tablet 500 mg (500 mg Oral Given 07/14/18 1228)    Or  methocarbamol (ROBAXIN) 500 mg in dextrose 5 % 50 mL IVPB ( Intravenous See Alternative 07/14/18 1228)  ceFAZolin (ANCEF) 2-4 GM/100ML-% IVPB (has no administration in  time range)  HYDROmorphone (DILAUDID) injection 0.25-0.5 mg (has no administration in time range)  ceFAZolin (ANCEF) IVPB 2g/100 mL premix (2 g Intravenous New Bag/Given 07/14/18 1238)  docusate sodium (COLACE) capsule 100 mg (100 mg Oral Given 07/14/18 1232)  polyethylene glycol (MIRALAX / GLYCOLAX) packet 17 g (has no administration in time range)  bisacodyl (DULCOLAX) suppository 10 mg (has no administration in time range)  sodium phosphate (FLEET) 7-19 GM/118ML enema 1 enema (has no administration in time range)  ondansetron (ZOFRAN) tablet 4 mg (has no administration in time range)    Or  ondansetron (ZOFRAN) injection 4 mg (has no administration  in time range)  metoCLOPramide (REGLAN) tablet 5-10 mg (has no administration in time range)    Or  metoCLOPramide (REGLAN) injection 5-10 mg (has no administration in time range)  acetaminophen (TYLENOL) tablet 500 mg (500 mg Oral Given 07/14/18 1227)  0.9 %  sodium chloride infusion ( Intravenous New Bag/Given 07/14/18 1130)  acetaminophen (TYLENOL) tablet 325-650 mg (has no administration in time range)  HYDROcodone-acetaminophen (NORCO/VICODIN) 5-325 MG per tablet 1 tablet (1 tablet Oral Given 07/14/18 1227)  methocarbamol (ROBAXIN) 500 MG tablet (has no administration in time range)  HYDROcodone-acetaminophen (NORCO/VICODIN) 5-325 MG per tablet (has no administration in time range)  atenolol (TENORMIN) tablet 50 mg (has no administration in time range)  pantoprazole (PROTONIX) EC tablet 40 mg (has no administration in time range)  oxybutynin (DITROPAN) tablet 5 mg (has no administration in time range)  simvastatin (ZOCOR) tablet 40 mg (has no administration in time range)  zolpidem (AMBIEN) tablet 5 mg (has no administration in time range)  LORazepam (ATIVAN) tablet 1 mg (has no administration in time range)  enoxaparin (LOVENOX) injection 30 mg (has no administration in time range)  fentaNYL (SUBLIMAZE) injection 100 mcg (100 mcg Intravenous Given 07/13/18 2211)  ondansetron (ZOFRAN) injection 4 mg (4 mg Intravenous Given 07/13/18 2211)  tranexamic acid (CYKLOKAPRON) 1000MG /137mL IVPB (  Override pull for Anesthesia 07/14/18 0805)  tranexamic acid (CYKLOKAPRON) IVPB 1,000 mg (1,000 mg Intravenous Given 07/14/18 0805)  fentaNYL (SUBLIMAZE) 100 MCG/2ML injection (  Duplicate 5/95/63 8756)     Initial Impression / Assessment and Plan / ED Course  I have reviewed the triage vital signs and the nursing notes.  Pertinent labs & imaging results that were available during my care of the patient were reviewed by me and considered in my medical decision making (see chart for details).         78 year old female who presents for evaluation of left hip pain after mechanical fall that occurred just prior to ED arrival.  Patient reports she was pushing a cart and tripped, causing her to fall.  She does report she hit her head.  No LOC.  She is not currently on blood thinners.  She has not been able to ambulate or bear weight since the incident. Patient is afebrile, non-toxic appearing, sitting comfortably on examination table. Vital signs reviewed and stable. Patient is neurovascularly intact.  Discern for hip fracture versus dislocation.  Will plan for preop labs, imaging.  CBC shows anemia of 11.6.  BMP shows sodium of 129, potassium of 5.4, bicarb of 21.  BUN is 29 and creatinine is 1.18. Given mild AKI, will give saline bolus.   XR of hip shows comminuted intertrochanteric left femoral fracture. CT head negative for any acute abnormality.  Will consult orthopedics.   Discussed patient with Dr. Doreatha Martin (Ortho). Recommends medical admission. Would like patient to go to [facility]  for evaluation and possible surgery tomorrow. Patient to be NPO after midnight.   Discussed patient with Dr. Shanon Brow (Hospitalist). Will accept patient for admission.   Portions of this note were generated with [employer]. Dictation errors may occur despite best attempts at proofreading.   Final Clinical Impressions(s) / ED Diagnoses   Final diagnoses:  Closed nondisplaced intertrochanteric fracture of left femur, initial encounter [facility])    ED Discharge Orders         Ordered    HYDROcodone-acetaminophen (NORCO) 5-325 MG tablet  Every 4 hours PRN     07/14/18 0934    enoxaparin (LOVENOX) 40 MG/0.4ML injection  Every 24 hours     07/14/18 0934           Volanda Napoleon, PA-C 07/14/18 1605    Sherwood Gambler, MD 07/17/18 1248    Volanda Napoleon, PA-C 07/17/18 1613    Sherwood Gambler, MD 07/18/18 [phone]
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
## 2018-07-14 ENCOUNTER — Encounter (HOSPITAL_COMMUNITY): Payer: Self-pay | Admitting: Registered Nurse

## 2018-07-14 ENCOUNTER — Encounter (HOSPITAL_COMMUNITY)
Admission: EM | Disposition: A | Discharge status: Skilled Nursing Facility | Payer: Self-pay | Source: Home / Self Care | Attending: Internal Medicine

## 2018-07-14 ENCOUNTER — Inpatient Hospital Stay (HOSPITAL_COMMUNITY): Payer: Medicare Other | Admitting: Registered Nurse

## 2018-07-14 ENCOUNTER — Inpatient Hospital Stay (HOSPITAL_COMMUNITY): Payer: Medicare Other

## 2018-07-14 DIAGNOSIS — K219 Gastro-esophageal reflux disease without esophagitis: Secondary | ICD-10-CM

## 2018-07-14 DIAGNOSIS — Z79899 Other long term (current) drug therapy: Secondary | ICD-10-CM | POA: Diagnosis not present

## 2018-07-14 DIAGNOSIS — N179 Acute kidney failure, unspecified: Secondary | ICD-10-CM | POA: Diagnosis not present

## 2018-07-14 DIAGNOSIS — Z7983 Long term (current) use of bisphosphonates: Secondary | ICD-10-CM | POA: Diagnosis not present

## 2018-07-14 DIAGNOSIS — Z791 Long term (current) use of non-steroidal anti-inflammatories (NSAID): Secondary | ICD-10-CM | POA: Diagnosis not present

## 2018-07-14 DIAGNOSIS — E7800 Pure hypercholesterolemia, unspecified: Secondary | ICD-10-CM | POA: Diagnosis not present

## 2018-07-14 DIAGNOSIS — S72145A Nondisplaced intertrochanteric fracture of left femur, initial encounter for closed fracture: Principal | ICD-10-CM

## 2018-07-14 DIAGNOSIS — E871 Hypo-osmolality and hyponatremia: Secondary | ICD-10-CM | POA: Diagnosis not present

## 2018-07-14 DIAGNOSIS — Z885 Allergy status to narcotic agent status: Secondary | ICD-10-CM | POA: Diagnosis not present

## 2018-07-14 DIAGNOSIS — I10 Essential (primary) hypertension: Secondary | ICD-10-CM

## 2018-07-14 DIAGNOSIS — S72002S Fracture of unspecified part of neck of left femur, sequela: Secondary | ICD-10-CM

## 2018-07-14 DIAGNOSIS — Z1159 Encounter for screening for other viral diseases: Secondary | ICD-10-CM | POA: Diagnosis not present

## 2018-07-14 DIAGNOSIS — D62 Acute posthemorrhagic anemia: Secondary | ICD-10-CM | POA: Diagnosis not present

## 2018-07-14 HISTORY — PX: INTRAMEDULLARY (IM) NAIL INTERTROCHANTERIC: SHX5875

## 2018-07-14 LAB — TYPE AND SCREEN
ABO/RH(D): O POS
Antibody Screen: NEGATIVE

## 2018-07-14 LAB — CBC
HCT: 33.2 % — ABNORMAL LOW (ref 36.0–46.0)
Hemoglobin: 11 g/dL — ABNORMAL LOW (ref 12.0–15.0)
MCH: 32.4 pg (ref 26.0–34.0)
MCHC: 33.1 g/dL (ref 30.0–36.0)
MCV: 97.6 fL (ref 80.0–100.0)
Platelets: 189 10*3/uL (ref 150–400)
RBC: 3.4 MIL/uL — ABNORMAL LOW (ref 3.87–5.11)
RDW: 11.9 % (ref 11.5–15.5)
WBC: 13.7 10*3/uL — ABNORMAL HIGH (ref 4.0–10.5)
nRBC: 0 % (ref 0.0–0.2)

## 2018-07-14 LAB — SURGICAL PCR SCREEN
MRSA, PCR: NEGATIVE
Staphylococcus aureus: NEGATIVE

## 2018-07-14 LAB — CREATININE, SERUM
Creatinine, Ser: 1.11 mg/dL — ABNORMAL HIGH (ref 0.44–1.00)
GFR calc Af Amer: 55 mL/min — ABNORMAL LOW (ref >60)
GFR calc non Af Amer: 48 mL/min — ABNORMAL LOW (ref >60)

## 2018-07-14 LAB — SARS CORONAVIRUS 2 BY RT PCR (HOSPITAL ORDER, PERFORMED IN ~~LOC~~ HOSPITAL LAB): SARS Coronavirus 2: NEGATIVE

## 2018-07-14 LAB — ABO/RH: ABO/RH(D): O POS

## 2018-07-14 SURGERY — FIXATION, FRACTURE, INTERTROCHANTERIC, WITH INTRAMEDULLARY ROD
Anesthesia: General | Site: Hip | Laterality: Left

## 2018-07-14 MED ORDER — HYDROCODONE-ACETAMINOPHEN 5-325 MG PO TABS
ORAL_TABLET | ORAL | Status: AC
  Filled 2018-07-14: qty 1

## 2018-07-14 MED ORDER — BISACODYL 10 MG RE SUPP: 10.0000 mg | Freq: Every day | RECTAL | Status: DC | PRN

## 2018-07-14 MED ORDER — DOCUSATE SODIUM 100 MG PO CAPS
100.0000 mg | ORAL_CAPSULE | Freq: Two times a day (BID) | ORAL | Status: DC
  Administered 2018-07-14 – 2018-07-17 (×7): 100 mg via ORAL
  Filled 2018-07-14 (×7): qty 1

## 2018-07-14 MED ORDER — METHOCARBAMOL 500 MG PO TABS
ORAL_TABLET | ORAL | Status: AC
  Filled 2018-07-14: qty 1

## 2018-07-14 MED ORDER — POLYETHYLENE GLYCOL 3350 17 G PO PACK: 17.0000 g | PACK | Freq: Every day | ORAL | Status: DC | PRN

## 2018-07-14 MED ORDER — CEFAZOLIN SODIUM-DEXTROSE 2-4 GM/100ML-% IV SOLN
2.0000 g | Freq: Four times a day (QID) | INTRAVENOUS | Status: AC
  Administered 2018-07-14 (×2): 2 g via INTRAVENOUS
  Filled 2018-07-14 (×3): qty 100

## 2018-07-14 MED ORDER — ATENOLOL 50 MG PO TABS
50.0000 mg | ORAL_TABLET | Freq: Every day | ORAL | Status: DC
  Administered 2018-07-14 – 2018-07-17 (×4): 50 mg via ORAL
  Filled 2018-07-14 (×4): qty 1

## 2018-07-14 MED ORDER — ENOXAPARIN SODIUM 40 MG/0.4ML ~~LOC~~ SOLN: 40.0000 mg | SUBCUTANEOUS | Status: DC

## 2018-07-14 MED ORDER — DOCUSATE SODIUM 100 MG PO CAPS
100.0000 mg | ORAL_CAPSULE | Freq: Two times a day (BID) | ORAL | Status: DC
  Administered 2018-07-14: 02:00:00 100 mg via ORAL
  Filled 2018-07-14: qty 1

## 2018-07-14 MED ORDER — STERILE WATER FOR IRRIGATION IR SOLN
Status: DC | PRN
  Administered 2018-07-14: 1000 mL

## 2018-07-14 MED ORDER — METOCLOPRAMIDE HCL 5 MG PO TABS: 5.0000 mg | ORAL_TABLET | Freq: Three times a day (TID) | ORAL | Status: DC | PRN

## 2018-07-14 MED ORDER — ACETAMINOPHEN 325 MG PO TABS: 325.0000 mg | ORAL_TABLET | Freq: Four times a day (QID) | ORAL | Status: DC | PRN

## 2018-07-14 MED ORDER — ENOXAPARIN SODIUM 30 MG/0.3ML ~~LOC~~ SOLN
30.0000 mg | SUBCUTANEOUS | Status: DC
  Administered 2018-07-15 – 2018-07-17 (×3): 30 mg via SUBCUTANEOUS
  Filled 2018-07-14 (×3): qty 0.3

## 2018-07-14 MED ORDER — DEXAMETHASONE SODIUM PHOSPHATE 10 MG/ML IJ SOLN
INTRAMUSCULAR | Status: DC | PRN
  Administered 2018-07-14: 10 mg via INTRAVENOUS

## 2018-07-14 MED ORDER — HYDROCODONE-ACETAMINOPHEN 5-325 MG PO TABS: 1.0000 | ORAL_TABLET | ORAL | 0 refills | Status: DC | PRN

## 2018-07-14 MED ORDER — FENTANYL CITRATE (PF) 100 MCG/2ML IJ SOLN
INTRAMUSCULAR | Status: AC
  Filled 2018-07-14: qty 2

## 2018-07-14 MED ORDER — METHOCARBAMOL 500 MG PO TABS
500.0000 mg | ORAL_TABLET | Freq: Four times a day (QID) | ORAL | Status: DC | PRN
  Administered 2018-07-14 – 2018-07-17 (×6): 500 mg via ORAL
  Filled 2018-07-14 (×6): qty 1

## 2018-07-14 MED ORDER — ONDANSETRON HCL 4 MG PO TABS: 4.0000 mg | ORAL_TABLET | Freq: Four times a day (QID) | ORAL | Status: DC | PRN

## 2018-07-14 MED ORDER — ONDANSETRON HCL 4 MG/2ML IJ SOLN: 4.0000 mg | Freq: Four times a day (QID) | INTRAMUSCULAR | Status: DC | PRN

## 2018-07-14 MED ORDER — FENTANYL CITRATE (PF) 250 MCG/5ML IJ SOLN
INTRAMUSCULAR | Status: AC
  Filled 2018-07-14: qty 5

## 2018-07-14 MED ORDER — ACETAMINOPHEN 10 MG/ML IV SOLN: 1000.0000 mg | Freq: Once | INTRAVENOUS | Status: DC | PRN

## 2018-07-14 MED ORDER — ACETAMINOPHEN 500 MG PO TABS
500.0000 mg | ORAL_TABLET | Freq: Four times a day (QID) | ORAL | Status: AC
  Administered 2018-07-14 – 2018-07-15 (×4): 500 mg via ORAL
  Filled 2018-07-14 (×4): qty 1

## 2018-07-14 MED ORDER — ACETAMINOPHEN 500 MG PO TABS: 1000.0000 mg | ORAL_TABLET | Freq: Once | ORAL | Status: DC | PRN

## 2018-07-14 MED ORDER — SIMVASTATIN 20 MG PO TABS
40.0000 mg | ORAL_TABLET | Freq: Every day | ORAL | Status: DC
  Administered 2018-07-14 – 2018-07-16 (×3): 40 mg via ORAL
  Filled 2018-07-14 (×3): qty 2

## 2018-07-14 MED ORDER — MIDAZOLAM HCL 5 MG/5ML IJ SOLN
INTRAMUSCULAR | Status: DC | PRN
  Administered 2018-07-14: 2 mg via INTRAVENOUS

## 2018-07-14 MED ORDER — SUGAMMADEX SODIUM 200 MG/2ML IV SOLN
INTRAVENOUS | Status: DC | PRN
  Administered 2018-07-14: 90 mg via INTRAVENOUS

## 2018-07-14 MED ORDER — LIDOCAINE 2% (20 MG/ML) 5 ML SYRINGE
INTRAMUSCULAR | Status: DC | PRN
  Administered 2018-07-14: 40 mg via INTRAVENOUS

## 2018-07-14 MED ORDER — METHOCARBAMOL 1000 MG/10ML IJ SOLN
500.0000 mg | Freq: Four times a day (QID) | INTRAVENOUS | Status: DC | PRN
  Filled 2018-07-14: qty 5

## 2018-07-14 MED ORDER — BUPIVACAINE HCL (PF) 0.25 % IJ SOLN
INTRAMUSCULAR | Status: AC
  Filled 2018-07-14: qty 30

## 2018-07-14 MED ORDER — FENTANYL CITRATE (PF) 250 MCG/5ML IJ SOLN
INTRAMUSCULAR | Status: DC | PRN
  Administered 2018-07-14: 50 ug via INTRAVENOUS
  Administered 2018-07-14 (×2): 25 ug via INTRAVENOUS
  Administered 2018-07-14: 75 ug via INTRAVENOUS

## 2018-07-14 MED ORDER — HYDROMORPHONE HCL 1 MG/ML IJ SOLN: 0.2500 mg | INTRAMUSCULAR | Status: DC | PRN

## 2018-07-14 MED ORDER — PROPOFOL 10 MG/ML IV BOLUS
INTRAVENOUS | Status: AC
  Filled 2018-07-14: qty 20

## 2018-07-14 MED ORDER — TRANEXAMIC ACID-NACL 1000-0.7 MG/100ML-% IV SOLN
1000.0000 mg | INTRAVENOUS | Status: AC
  Administered 2018-07-14: 08:00:00 1000 mg via INTRAVENOUS

## 2018-07-14 MED ORDER — OXYCODONE HCL 5 MG/5ML PO SOLN: 5.0000 mg | Freq: Once | ORAL | Status: DC | PRN

## 2018-07-14 MED ORDER — SODIUM CHLORIDE 0.9 % IV SOLN
INTRAVENOUS | Status: DC
  Administered 2018-07-14: 12:00:00 via INTRAVENOUS

## 2018-07-14 MED ORDER — SUCCINYLCHOLINE CHLORIDE 200 MG/10ML IV SOSY: PREFILLED_SYRINGE | INTRAVENOUS | Status: DC | PRN

## 2018-07-14 MED ORDER — FLEET ENEMA 7-19 GM/118ML RE ENEM: 1.0000 | ENEMA | Freq: Once | RECTAL | Status: DC | PRN

## 2018-07-14 MED ORDER — METOCLOPRAMIDE HCL 5 MG/ML IJ SOLN: 5.0000 mg | Freq: Three times a day (TID) | INTRAMUSCULAR | Status: DC | PRN

## 2018-07-14 MED ORDER — ACETAMINOPHEN 160 MG/5ML PO SOLN: 1000.0000 mg | Freq: Once | ORAL | Status: DC | PRN

## 2018-07-14 MED ORDER — CEFAZOLIN SODIUM-DEXTROSE 2-3 GM-%(50ML) IV SOLR
INTRAVENOUS | Status: DC | PRN
  Administered 2018-07-14: 2 g via INTRAVENOUS

## 2018-07-14 MED ORDER — HYDROMORPHONE HCL 1 MG/ML IJ SOLN
0.5000 mg | INTRAMUSCULAR | Status: DC | PRN
  Administered 2018-07-14: 0.5 mg via INTRAVENOUS
  Filled 2018-07-14: qty 1

## 2018-07-14 MED ORDER — ONDANSETRON HCL 4 MG/2ML IJ SOLN
INTRAMUSCULAR | Status: DC | PRN
  Administered 2018-07-14: 4 mg via INTRAVENOUS

## 2018-07-14 MED ORDER — SODIUM CHLORIDE 0.9 % IR SOLN
Status: DC | PRN
  Administered 2018-07-14: 1000 mL

## 2018-07-14 MED ORDER — PANTOPRAZOLE SODIUM 40 MG PO TBEC
40.0000 mg | DELAYED_RELEASE_TABLET | Freq: Every day | ORAL | Status: DC
  Administered 2018-07-14 – 2018-07-17 (×4): 40 mg via ORAL
  Filled 2018-07-14 (×4): qty 1

## 2018-07-14 MED ORDER — TRANEXAMIC ACID-NACL 1000-0.7 MG/100ML-% IV SOLN
INTRAVENOUS | Status: AC
  Filled 2018-07-14: qty 100

## 2018-07-14 MED ORDER — HYDROCODONE-ACETAMINOPHEN 5-325 MG PO TABS
1.0000 | ORAL_TABLET | ORAL | Status: DC | PRN
  Administered 2018-07-14 – 2018-07-17 (×7): 1 via ORAL
  Filled 2018-07-14 (×6): qty 1

## 2018-07-14 MED ORDER — ROCURONIUM BROMIDE 10 MG/ML (PF) SYRINGE
PREFILLED_SYRINGE | INTRAVENOUS | Status: DC | PRN
  Administered 2018-07-14: 30 mg via INTRAVENOUS

## 2018-07-14 MED ORDER — EPHEDRINE SULFATE-NACL 50-0.9 MG/10ML-% IV SOSY
PREFILLED_SYRINGE | INTRAVENOUS | Status: DC | PRN
  Administered 2018-07-14 (×2): 5 mg via INTRAVENOUS

## 2018-07-14 MED ORDER — LORAZEPAM 1 MG PO TABS
1.0000 mg | ORAL_TABLET | Freq: Four times a day (QID) | ORAL | Status: DC | PRN
  Administered 2018-07-14 – 2018-07-17 (×2): 1 mg via ORAL
  Filled 2018-07-14 (×2): qty 2

## 2018-07-14 MED ORDER — ZOLPIDEM TARTRATE 5 MG PO TABS
5.0000 mg | ORAL_TABLET | Freq: Every evening | ORAL | Status: DC | PRN
  Administered 2018-07-16: 22:00:00 5 mg via ORAL
  Filled 2018-07-14: qty 1

## 2018-07-14 MED ORDER — MIDAZOLAM HCL 2 MG/2ML IJ SOLN
INTRAMUSCULAR | Status: AC
  Filled 2018-07-14: qty 2

## 2018-07-14 MED ORDER — OXYCODONE HCL 5 MG PO TABS: 5.0000 mg | ORAL_TABLET | Freq: Once | ORAL | Status: DC | PRN

## 2018-07-14 MED ORDER — FENTANYL CITRATE (PF) 100 MCG/2ML IJ SOLN
25.0000 ug | INTRAMUSCULAR | Status: DC | PRN
  Administered 2018-07-14 (×2): 50 ug via INTRAVENOUS

## 2018-07-14 MED ORDER — ENOXAPARIN SODIUM 40 MG/0.4ML ~~LOC~~ SOLN: 40.0000 mg | SUBCUTANEOUS | 0 refills | Status: DC

## 2018-07-14 MED ORDER — BUPIVACAINE HCL 0.25 % IJ SOLN
INTRAMUSCULAR | Status: DC | PRN
  Administered 2018-07-14: 20 mL

## 2018-07-14 MED ORDER — PROPOFOL 10 MG/ML IV BOLUS
INTRAVENOUS | Status: DC | PRN
  Administered 2018-07-14: 50 mg via INTRAVENOUS
  Administered 2018-07-14: 30 mg via INTRAVENOUS

## 2018-07-14 MED ORDER — LACTATED RINGERS IV SOLN
INTRAVENOUS | Status: DC | PRN
  Administered 2018-07-14: 08:00:00 via INTRAVENOUS

## 2018-07-14 MED ORDER — OXYBUTYNIN CHLORIDE 5 MG PO TABS
5.0000 mg | ORAL_TABLET | Freq: Two times a day (BID) | ORAL | Status: DC
  Administered 2018-07-14 – 2018-07-17 (×7): 5 mg via ORAL
  Filled 2018-07-14 (×7): qty 1

## 2018-07-14 MED ORDER — CEFAZOLIN SODIUM-DEXTROSE 2-4 GM/100ML-% IV SOLN
INTRAVENOUS | Status: AC
  Filled 2018-07-14: qty 100

## 2018-07-14 SURGICAL SUPPLY — 37 items
BIT DRILL AO GAMMA 4.2X180 (BIT) ×1 IMPLANT
CLSR STERI-STRIP ANTIMIC 1/2X4 (GAUZE/BANDAGES/DRESSINGS) ×2 IMPLANT
COVER MAYO STAND STRL (DRAPES) ×2 IMPLANT
COVER PERINEAL POST (MISCELLANEOUS) ×2 IMPLANT
COVER SURGICAL LIGHT HANDLE (MISCELLANEOUS) ×2 IMPLANT
COVER WAND RF STERILE (DRAPES) ×2 IMPLANT
DRAPE STERI IOBAN 125X83 (DRAPES) ×2 IMPLANT
DRSG MEPILEX BORDER 4X4 (GAUZE/BANDAGES/DRESSINGS) ×4 IMPLANT
DURAPREP 26ML APPLICATOR (WOUND CARE) ×2 IMPLANT
ELECT REM PT RETURN 9FT ADLT (ELECTROSURGICAL) ×2
ELECTRODE REM PT RTRN 9FT ADLT (ELECTROSURGICAL) ×1 IMPLANT
GLOVE BIO SURGEON STRL SZ7.5 (GLOVE) ×4 IMPLANT
GLOVE BIOGEL PI IND STRL 8 (GLOVE) ×2 IMPLANT
GLOVE BIOGEL PI INDICATOR 8 (GLOVE) ×2
GOWN STRL REUS W/ TWL LRG LVL3 (GOWN DISPOSABLE) ×2 IMPLANT
GOWN STRL REUS W/TWL LRG LVL3 (GOWN DISPOSABLE) ×2
GUIDEROD T2 3X1000 (ROD) ×1 IMPLANT
K-WIRE  3.2X450M STR (WIRE) ×1
K-WIRE 3.2X450M STR (WIRE) ×1
KIT BASIN OR (CUSTOM PROCEDURE TRAY) ×2 IMPLANT
KIT TURNOVER KIT B (KITS) ×2 IMPLANT
KWIRE 3.2X450M STR (WIRE) IMPLANT
MANIFOLD NEPTUNE II (INSTRUMENTS) ×2 IMPLANT
NAIL LONG RT 1.5 10X340-125 (Nail) ×1 IMPLANT
NS IRRIG 1000ML POUR BTL (IV SOLUTION) ×2 IMPLANT
PACK GENERAL/GYN (CUSTOM PROCEDURE TRAY) ×2 IMPLANT
PAD ARMBOARD 7.5X6 YLW CONV (MISCELLANEOUS) ×4 IMPLANT
SCREW LAG GAMMA 3 TI 10.5X80MM (Screw) ×1 IMPLANT
SCREW LOCKING T2 F/T  5MMX40MM (Screw) ×1 IMPLANT
SCREW LOCKING T2 F/T 5MMX40MM (Screw) IMPLANT
STRIP CLOSURE SKIN 1/2X4 (GAUZE/BANDAGES/DRESSINGS) ×2 IMPLANT
SUT MNCRL AB 4-0 PS2 18 (SUTURE) ×2 IMPLANT
SUT VIC AB 2-0 CT1 27 (SUTURE) ×1
SUT VIC AB 2-0 CT1 TAPERPNT 27 (SUTURE) ×1 IMPLANT
TOWEL OR 17X26 10 PK STRL BLUE (TOWEL DISPOSABLE) ×2 IMPLANT
TOWEL OR NON WOVEN STRL DISP B (DISPOSABLE) ×2 IMPLANT
WATER STERILE IRR 1000ML POUR (IV SOLUTION) ×2 IMPLANT

## 2018-07-14 NOTE — ED Notes (Signed)
[facility] contacted for transport. Paperwork printed and at nursing station.

## 2018-07-14 NOTE — Progress Notes (Signed)
PROGRESS NOTE    [name] [name]  TLX:[id number] DOB: [birth year] DOA: 07/13/2018 PCP: Sinda Du, MD    Brief Narrative:  78 y.o. female with medical history significant of hypertension, hyperlipidemia was shopping today for groceries when she slipped in the aisle and fell injuring her left leg.  She did not trip or fall anything she states she just lost her footing and fell.  She did hit her head but did not have loss of consciousness.  She denies any previous illnesses no previous respiratory illnesses or shortness of breath cough nausea vomiting or diarrhea.  Patient found to have a left femur fracture referred for admission for such.  Orthopedic surgery has been consulted planning on taking her to the OR at [facility] tomorrow.  Therefore patient be transferred to [facility] from [place] [facility] tonight.  Assessment & Plan:   Principal Problem:   Hip fracture ([place]) Active Problems:   GERD (gastroesophageal reflux disease)   Hypertension   Hypercholesteremia    Hip fracture ([place]) -Left femur fracture -Orthopedic Surgery following and pt now s/p surgery 5/24 -Continue analgesic as needed -PT/OT consultation pendning     GERD (gastroesophageal reflux disease) -Will resume PPI  -Seems stable at present    Hypertension -BP suboptimally controlled overnight -Will resume home BP med    Hypercholesteremia -have resumed statin per home regimen  Anxiety -Pt noted to have 1mg  ativan 4 time daily scheduled on home med rec -Will continue on PRN while here   DVT prophylaxis: Lovenox subQ Code Status: Full Family Communication: Pt in room, family not at bedside Disposition Plan: Uncertain at this time, pending PT eval  Consultants:   Orthopedic Surgery  Procedures:   L hip intramedullary nail 5/24  Antimicrobials: Anti-infectives (From admission, onward)   Start     Dose/Rate Route Frequency Ordered Stop   07/14/18 1030  ceFAZolin (ANCEF) IVPB 2g/100 mL premix     2  g 200 mL/hr over 30 Minutes Intravenous Every 6 hours 07/14/18 1021 07/14/18 2229   07/14/18 0733  ceFAZolin (ANCEF) 2-4 GM/100ML-% IVPB    Note to Pharmacy:  Derinda Sis   : cabinet override      07/14/18 0733 07/14/18 1944       Subjective: Without complaints this AM  Objective: Vitals:   07/14/18 0930 07/14/18 0945 07/14/18 1000 07/14/18 1005  BP: 130/77 133/73 (!) 134/96 130/85  Pulse: 74 68 72 67  Resp: 16 16 16 14   Temp: (!) 97.3 F (36.3 C)  (!) 97.2 F (36.2 C)   TempSrc:      SpO2: 100% 98% 96% 98%  Weight:      Height:        Intake/Output Summary (Last 24 hours) at 07/14/2018 1426 Last data filed at 07/14/2018 1359 Gross per 24 hour  Intake 800 ml  Output 1750 ml  Net -950 ml   Filed Weights   07/13/18 2139 07/14/18 0400  Weight: 44.9 kg 44.9 kg    Examination:  General exam: Appears calm and comfortable  Respiratory system: Clear to auscultation. Respiratory effort normal. Cardiovascular system: S1 & S2 heard, RRR Gastrointestinal system: Abdomen is nondistended, soft and nontender. No organomegaly or masses felt. Normal bowel sounds heard. Central nervous system: Alert and oriented. No focal neurological deficits. Extremities: Symmetric 5 x 5 power. Skin: No rashes, lesions Psychiatry: Judgement and insight appear normal. Mood & affect appropriate.   Data Reviewed: I have personally reviewed following labs and imaging studies  CBC: Recent  Labs  Lab 07/13/18 2208 07/14/18 1126  WBC 9.1 13.7*  NEUTROABS 6.3  --   HGB 11.6* 11.0*  HCT 35.6* 33.2*  MCV 100.8* 97.6  PLT 194 315   Basic Metabolic Panel: Recent Labs  Lab 07/13/18 2208 07/14/18 1126  NA 129*  --   K 5.4*  --   CL 101  --   CO2 21*  --   GLUCOSE 105*  --   BUN 29*  --   CREATININE 1.18* 1.11*  CALCIUM 7.8*  --    GFR: Estimated Creatinine Clearance: 28.5 mL/min (A) (by C-G formula based on SCr of 1.11 mg/dL (H)). Liver Function Tests: No results for input(s):  AST, ALT, ALKPHOS, BILITOT, PROT, ALBUMIN in the last 168 hours. No results for input(s): LIPASE, AMYLASE in the last 168 hours. No results for input(s): AMMONIA in the last 168 hours. Coagulation Profile: No results for input(s): INR, PROTIME in the last 168 hours. Cardiac Enzymes: No results for input(s): CKTOTAL, CKMB, CKMBINDEX, TROPONINI in the last 168 hours. BNP (last 3 results) No results for input(s): PROBNP in the last 8760 hours. HbA1C: No results for input(s): HGBA1C in the last 72 hours. CBG: No results for input(s): GLUCAP in the last 168 hours. Lipid Profile: No results for input(s): CHOL, HDL, LDLCALC, TRIG, CHOLHDL, LDLDIRECT in the last 72 hours. Thyroid Function Tests: No results for input(s): TSH, T4TOTAL, FREET4, T3FREE, THYROIDAB in the last 72 hours. Anemia Panel: No results for input(s): VITAMINB12, FOLATE, FERRITIN, TIBC, IRON, RETICCTPCT in the last 72 hours. Sepsis Labs: No results for input(s): PROCALCITON, LATICACIDVEN in the last 168 hours.  Recent Results (from the past 240 hour(s))  SARS Coronavirus 2 (CEPHEID - Performed in [place] hospital lab), Hosp Order     Status: None   Collection Time: 07/14/18 12:31 AM  Result Value Ref Range Status   SARS Coronavirus 2 NEGATIVE NEGATIVE Final    Comment: (NOTE) If result is NEGATIVE SARS-CoV-2 target nucleic acids are NOT DETECTED. The SARS-CoV-2 RNA is generally detectable in upper and lower  respiratory specimens during the acute phase of infection. The lowest  concentration of SARS-CoV-2 viral copies this assay can detect is 250  copies / mL. A negative result does not preclude SARS-CoV-2 infection  and should not be used as the sole basis for treatment or other  patient management decisions.  A negative result may occur with  improper specimen collection / handling, submission of specimen other  than nasopharyngeal swab, presence of viral mutation(s) within the  areas targeted by this assay, and  inadequate number of viral copies  (<250 copies / mL). A negative result must be combined with clinical  observations, patient history, and epidemiological information. If result is POSITIVE SARS-CoV-2 target nucleic acids are DETECTED. The SARS-CoV-2 RNA is generally detectable in upper and lower  respiratory specimens dur ing the acute phase of infection.  Positive  results are indicative of active infection with SARS-CoV-2.  Clinical  correlation with patient history and other diagnostic information is  necessary to determine patient infection status.  Positive results do  not rule out bacterial infection or co-infection with other viruses. If result is PRESUMPTIVE POSTIVE SARS-CoV-2 nucleic acids MAY BE PRESENT.   A presumptive positive result was obtained on the submitted specimen  and confirmed on repeat testing.  While 2019 novel coronavirus  (SARS-CoV-2) nucleic acids may be present in the submitted sample  additional confirmatory testing may be necessary for epidemiological  and / or  clinical management purposes  to differentiate between  SARS-CoV-2 and other Sarbecovirus currently known to infect humans.  If clinically indicated additional testing with an alternate test  methodology [phone]) is advised. The SARS-CoV-2 RNA is generally  detectable in upper and lower respiratory sp ecimens during the acute  phase of infection. The expected result is Negative. Fact Sheet for Patients:  StrictlyIdeas.no Fact Sheet for Healthcare Providers: BankingDealers.co.za This test is not yet approved or cleared by the [place] FDA and has been authorized for detection and/or diagnosis of SARS-CoV-2 by FDA under an Emergency Use Authorization (EUA).  This EUA will remain in effect (meaning this test can be used) for the duration of the COVID-19 declaration under Section 564(b)(1) of the Act, 21 U.S.C. section 360bbb-3(b)(1), unless the  authorization is terminated or revoked sooner. Performed at [facility], [place] [address] [place], [place] [address]   Surgical PCR screen     Status: None   Collection Time: 07/14/18  4:45 AM  Result Value Ref Range Status   MRSA, PCR NEGATIVE NEGATIVE Final   Staphylococcus aureus NEGATIVE NEGATIVE Final    Comment: (NOTE) The Xpert SA Assay (FDA approved for NASAL specimens in patients 52 years of age and older), is one component of a comprehensive surveillance program. It is not intended to diagnose infection nor to guide or monitor treatment. Performed at [facility], [place] [address], [place], [place] [address]      Radiology Studies: Dg Chest 2 View  Result Date: 07/13/2018 CLINICAL DATA:  Recent fall with known left femoral fracture EXAM: CHEST - 2 VIEW COMPARISON:  03/05/2014, CT from 05/26/2017 FINDINGS: Cardiac shadow is within normal limits. Small hiatal hernia is again noted. Lungs are well aerated bilaterally. No focal infiltrate or effusion is seen. No pneumothorax is noted. No acute bony abnormality is seen. IMPRESSION: Small sliding-type hiatal hernia.  No acute abnormality noted. Electronically Signed   By: Inez Catalina M.D.   On: 07/13/2018 22:56   Ct Head Wo Contrast  Result Date: 07/13/2018 CLINICAL DATA:  Fall with head trauma. EXAM: CT HEAD WITHOUT CONTRAST TECHNIQUE: Contiguous axial images were obtained from the base of the skull through the vertex without intravenous contrast. COMPARISON:  04/28/2012 FINDINGS: Brain: There is no evidence for acute hemorrhage, hydrocephalus, mass lesion, or abnormal extra-axial fluid collection. No definite CT evidence for acute infarction. Vascular: No hyperdense vessel or unexpected calcification. Skull: No evidence for fracture. No worrisome lytic or sclerotic lesion. Sinuses/Orbits: The visualized paranasal sinuses and mastoid air cells are clear. Visualized portions of the globes and intraorbital fat  are unremarkable. Other: None. IMPRESSION: 1. Stable exam.  No acute intracranial abnormality. Electronically Signed   By: Misty Stanley M.D.   On: 07/13/2018 23:07   Dg C-arm 1-60 Min  Result Date: 07/14/2018 CLINICAL DATA:  78 year old female with fracture EXAM: DG C-ARM 61-120 MIN; LEFT FEMUR 2 VIEWS COMPARISON:  07/13/2018 FINDINGS: Multiple intraoperative fluoroscopic spot images demonstrate ORIF of left hip fracture with antegrade intramedullary rod and cannulated screw. IMPRESSION: Limited intraoperative fluoroscopic spot images demonstrating ORIF of left hip. Please refer to the dictated operative report for full details of intraoperative findings and procedure. Electronically Signed   By: Corrie Mckusick D.O.   On: 07/14/2018 09:23   Dg Hip Unilat W Or Wo Pelvis 2-3 Views Left  Result Date: 07/13/2018 CLINICAL DATA:  Recent fall with left hip pain, initial encounter EXAM: DG HIP (WITH OR WITHOUT PELVIS) 3V LEFT COMPARISON:  None. FINDINGS: Comminuted intratrochanteric left femoral fracture is noted with impaction at the fracture site. Femoral head is well seated. Pelvic ring is intact. No soft tissue abnormality is noted. IMPRESSION: Comminuted intratrochanteric left femoral fracture. Electronically Signed   By: Inez Catalina M.D.   On: 07/13/2018 22:55   Dg Femur Min 2 Views Left  Result Date: 07/14/2018 CLINICAL DATA:  78 year old female with fracture EXAM: DG C-ARM 61-120 MIN; LEFT FEMUR 2 VIEWS COMPARISON:  07/13/2018 FINDINGS: Multiple intraoperative fluoroscopic spot images demonstrate ORIF of left hip fracture with antegrade intramedullary rod and cannulated screw. IMPRESSION: Limited intraoperative fluoroscopic spot images demonstrating ORIF of left hip. Please refer to the dictated operative report for full details of intraoperative findings and procedure. Electronically Signed   By: Corrie Mckusick D.O.   On: 07/14/2018 09:23    Scheduled Meds: . acetaminophen  500 mg Oral Q6H  .  docusate sodium  100 mg Oral BID  . [START ON 07/15/2018] enoxaparin (LOVENOX) injection  40 mg Subcutaneous Q24H  . HYDROcodone-acetaminophen      . methocarbamol       Continuous Infusions: . sodium chloride 75 mL/hr at 07/14/18 1130  . ceFAZolin    .  ceFAZolin (ANCEF) IV 2 g (07/14/18 1238)  . methocarbamol (ROBAXIN) IV       LOS: 1 day   Marylu Lund, MD [facility] Pager On Amion  If 7PM-7AM, please contact night-coverage 07/14/2018, 2:26 PM

## 2018-07-14 NOTE — Consult Note (Signed)
ORTHOPAEDIC CONSULTATION  REQUESTING PHYSICIAN: Donne Hazel, MD  Chief Complaint: Fall, left Femur fracture  HPI: [name] [name] is a 78 y.o. female who complains of left hip pain inability to bear weight after fall at a [employer] store yesterday.  She presented to the emergency department where x-rays showed closed comminuted displaced left intertrochanteric femur fracture.  Orthopedics was consulted for evaluation.  This morning her pain is controlled.  She denies history of DVT/PE.  Previously ambulatory with occasional use of cane.  She lives with her son.  Past Medical History:  Diagnosis Date  . Anxiety   . Depression   . GERD (gastroesophageal reflux disease)   . Hypercholesteremia   . Hypertension    Past Surgical History:  Procedure Laterality Date  . BREAST REDUCTION SURGERY    . CESAREAN SECTION    . COLONOSCOPY  08/2005  . ESOPHAGOGASTRODUODENOSCOPY N/A 08/19/2013   Procedure: ESOPHAGOGASTRODUODENOSCOPY (EGD);  Surgeon: Rogene Houston, MD;  Location: [facility];  Service: Endoscopy;  Laterality: N/A;  11:15 - rescheduled to 7:30 - Ann notified pt  . excision of submandibular gland    . HEMORRHOID SURGERY    . STOMACH SURGERY     years ago. ? reason  . TONSILLECTOMY     Social History   Socioeconomic History  . Marital status: Widowed    Spouse name: Not on file  . Number of children: Not on file  . Years of education: Not on file  . Highest education level: Not on file  Occupational History  . Not on file  Social Needs  . Financial resource strain: Not on file  . Food insecurity:    Worry: Not on file    Inability: Not on file  . Transportation needs:    Medical: Not on file    Non-medical: Not on file  Tobacco Use  . Smoking status: Never Smoker  . Smokeless tobacco: Never Used  Substance and Sexual Activity  . Alcohol use: No    Alcohol/week: 0.0 standard drinks  . Drug use: No  . Sexual activity: Not Currently    Birth  control/protection: Post-menopausal  Lifestyle  . Physical activity:    Days per week: Not on file    Minutes per session: Not on file  . Stress: Not on file  Relationships  . Social connections:    Talks on phone: Not on file    Gets together: Not on file    Attends religious service: Not on file    Active member of club or organization: Not on file    Attends meetings of clubs or organizations: Not on file    Relationship status: Not on file  Other Topics Concern  . Not on file  Social History Narrative  . Not on file   Family History  Problem Relation Age of Onset  . Sleep apnea Son   . Diabetes Son    Allergies  Allergen Reactions  . Morphine And Related Other (See Comments)    Patient stated that she thinks that morphine was one of the medication that she had a bad reaction to. She can not remember the other. She said that she blacked out when she took it.  . Other     Allergic to  med, does not know name   Prior to Admission medications   Medication Sig Start Date End Date Taking? Authorizing Provider  alendronate (FOSAMAX) 70 MG tablet Take 70 mg by mouth once  a week. Take with a full glass of water on an empty stomach. 03/17/13  Yes [provider]  atenolol (TENORMIN) 50 MG tablet Take 50 mg by mouth daily. 05/15/13  Yes [provider]  celecoxib (CELEBREX) 200 MG capsule Take 200 mg by mouth daily after breakfast. 05/04/18  Yes [provider]  diphenhydrAMINE (BENADRYL) 25 MG tablet Take 25 mg by mouth every 6 (six) hours as needed for allergies.   Yes [provider]  esomeprazole (NEXIUM) 40 MG capsule Take 40 mg by mouth daily after breakfast.    Yes [provider]  LORazepam (ATIVAN) 1 MG tablet Take 1 mg by mouth 4 (four) times daily. scheduled 05/21/12  Yes [provider]  oxybutynin (DITROPAN) 5 MG tablet Take 5 mg by mouth 2 (two) times a day. 06/06/18  Yes [provider]  oxyCODONE-acetaminophen  (PERCOCET/ROXICET) 5-325 MG tablet Take 1 tablet by mouth 2 (two) times a day.    Yes [provider]  simvastatin (ZOCOR) 40 MG tablet Take 40 mg by mouth at bedtime. 05/15/13  Yes [provider]  tiZANidine (ZANAFLEX) 4 MG tablet Take 4 mg by mouth every 8 (eight) hours as needed for muscle spasms. 12/21/12  Yes [provider]  zolpidem (AMBIEN) 10 MG tablet Take 7.5 mg by mouth at bedtime.    Yes [provider]  solifenacin (VESICARE) 5 MG tablet Take 1 tablet (5 mg total) by mouth daily. Patient not taking: Reported on 07/13/2018 12/03/17   Estill Dooms, NP   Dg Chest 2 View  Result Date: 07/13/2018 CLINICAL DATA:  Recent fall with known left femoral fracture EXAM: CHEST - 2 VIEW COMPARISON:  03/05/2014, CT from 05/26/2017 FINDINGS: Cardiac shadow is within normal limits. Small hiatal hernia is again noted. Lungs are well aerated bilaterally. No focal infiltrate or effusion is seen. No pneumothorax is noted. No acute bony abnormality is seen. IMPRESSION: Small sliding-type hiatal hernia.  No acute abnormality noted. Electronically Signed   By: Inez Catalina M.D.   On: 07/13/2018 22:56   Ct Head Wo Contrast  Result Date: 07/13/2018 CLINICAL DATA:  Fall with head trauma. EXAM: CT HEAD WITHOUT CONTRAST TECHNIQUE: Contiguous axial images were obtained from the base of the skull through the vertex without intravenous contrast. COMPARISON:  04/28/2012 FINDINGS: Brain: There is no evidence for acute hemorrhage, hydrocephalus, mass lesion, or abnormal extra-axial fluid collection. No definite CT evidence for acute infarction. Vascular: No hyperdense vessel or unexpected calcification. Skull: No evidence for fracture. No worrisome lytic or sclerotic lesion. Sinuses/Orbits: The visualized paranasal sinuses and mastoid air cells are clear. Visualized portions of the globes and intraorbital fat are unremarkable. Other: None. IMPRESSION: 1. Stable exam.  No acute  intracranial abnormality. Electronically Signed   By: Misty Stanley M.D.   On: 07/13/2018 23:07   Dg Hip Unilat W Or Wo Pelvis 2-3 Views Left  Result Date: 07/13/2018 CLINICAL DATA:  Recent fall with left hip pain, initial encounter EXAM: DG HIP (WITH OR WITHOUT PELVIS) 3V LEFT COMPARISON:  None. FINDINGS: Comminuted intratrochanteric left femoral fracture is noted with impaction at the fracture site. Femoral head is well seated. Pelvic ring is intact. No soft tissue abnormality is noted. IMPRESSION: Comminuted intratrochanteric left femoral fracture. Electronically Signed   By: Inez Catalina M.D.   On: 07/13/2018 22:55    Positive ROS: All other systems have been reviewed and were otherwise negative with the exception of those mentioned in the HPI and as  above.  Objective: Labs cbc Recent Labs    07/13/18 2208  WBC 9.1  HGB 11.6*  HCT 35.6*  PLT 194    Labs inflam No results for input(s): CRP in the last 72 hours.  Invalid input(s): ESR  Labs coag No results for input(s): INR, PTT in the last 72 hours.  Invalid input(s): PT  Recent Labs    07/13/18 2208  NA 129*  K 5.4*  CL 101  CO2 21*  GLUCOSE 105*  BUN 29*  CREATININE 1.18*  CALCIUM 7.8*    Physical Exam: Vitals:   07/14/18 0323 07/14/18 0355  BP: (!) 175/98 (!) 155/84  Pulse: 78 77  Resp: 17   Temp:  98.8 F (37.1 C)  SpO2: 99% 98%   General: Alert, no acute distress.  Conversant. Mental status: Alert and Oriented x3 Neurologic: Speech Clear and organized, no gross focal findings or movement disorder appreciated. Respiratory: No cyanosis, no use of accessory musculature Cardiovascular: No pedal edema GI: Abdomen is soft and non-tender, non-distended. Skin: Warm and dry.  No lesions in the area of chief complaint  Extremities: Warm and well perfused w/o edema Psychiatric: Patient is competent for consent with normal mood and affect  MUSCULOSKELETAL:  LLE: Pain with range of motion and movement of  the hip.  EHL, FHL, dorsiflexion, plantarflexion intact distally.  Sensation intact distally.  Feet warm. Other extremities are atraumatic with painless ROM and NVI.  Assessment / Plan: Principal Problem:   Hip fracture ([facility]) Active Problems:   GERD (gastroesophageal reflux disease)   Hypertension   Hypercholesteremia   Closed comminuted left intertrochanteric femur fracture  Plan for Operative fixation today -NPO  -Medicine team to admit and perform pre-op clearance -PT/OT post op -Foley okay prn for comfort   The risks benefits and alternatives of the procedure were discussed with the patient including but not limited to infection, bleeding, nerve injury, the need for revision surgery, blood clots, cardiopulmonary complications, morbidity, mortality, among others.  The patient verbalizes understanding and wishes to proceed.    Weightbearing: Bedrest.  Will amend post op. VTE prophylaxis: Will amend post op.. SCDs.  Pain control:  Minimize narcotics if able. Follow-up plan: Will follow in acute inpatient post-op phase.  Plan for outpatient follow up with Dr. Alain Marion in about 2 weeks. Contact information:  [name] MD, Roxan Hockey PA-C  Prudencio Burly III PA-C 07/14/2018 7:15 AM

## 2018-07-14 NOTE — Progress Notes (Signed)
CSW received consult. Awaiting for formal PT/OT recommendation prior to discharge planning.

## 2018-07-14 NOTE — Anesthesia Procedure Notes (Signed)
Procedure Name: Intubation Date/Time: 07/14/2018 8:00 AM Performed by: Jearld Pies, CRNA Pre-anesthesia Checklist: Patient identified, Emergency Drugs available, Suction available and Patient being monitored Patient Re-evaluated:Patient Re-evaluated prior to induction Oxygen Delivery Method: Circle System Utilized Preoxygenation: Pre-oxygenation with 100% oxygen Induction Type: IV induction Ventilation: Mask ventilation without difficulty Laryngoscope Size: Miller and 2 Grade View: Grade I Tube type: Oral Tube size: 7.0 mm Number of attempts: 1 Airway Equipment and Method: Stylet and Oral airway Placement Confirmation: ETT inserted through vocal cords under direct vision,  positive ETCO2 and breath sounds checked- equal and bilateral Secured at: 20 cm Tube secured with: Tape Dental Injury: Teeth and Oropharynx as per pre-operative assessment

## 2018-07-14 NOTE — H&P (Signed)
ORTHOPAEDIC CONSULTATION  REQUESTING PHYSICIAN: Donne Hazel, MD  Chief Complaint: left hip fracture  HPI: [name] [name] is a 78 y.o. female who complains of  A mechanical fall onto the left side. She has L hip and leg pain  Past Medical History:  Diagnosis Date  . Anxiety   . Depression   . GERD (gastroesophageal reflux disease)   . Hypercholesteremia   . Hypertension    Past Surgical History:  Procedure Laterality Date  . BREAST REDUCTION SURGERY    . CESAREAN SECTION    . COLONOSCOPY  08/2005  . ESOPHAGOGASTRODUODENOSCOPY N/A 08/19/2013   Procedure: ESOPHAGOGASTRODUODENOSCOPY (EGD);  Surgeon: Rogene Houston, MD;  Location: [facility];  Service: Endoscopy;  Laterality: N/A;  11:15 - rescheduled to 7:30 - Ann notified pt  . excision of submandibular gland    . HEMORRHOID SURGERY    . STOMACH SURGERY     years ago. ? reason  . TONSILLECTOMY     Social History   Socioeconomic History  . Marital status: Widowed    Spouse name: Not on file  . Number of children: Not on file  . Years of education: Not on file  . Highest education level: Not on file  Occupational History  . Not on file  Social Needs  . Financial resource strain: Not on file  . Food insecurity:    Worry: Not on file    Inability: Not on file  . Transportation needs:    Medical: Not on file    Non-medical: Not on file  Tobacco Use  . Smoking status: Never Smoker  . Smokeless tobacco: Never Used  Substance and Sexual Activity  . Alcohol use: No    Alcohol/week: 0.0 standard drinks  . Drug use: No  . Sexual activity: Not Currently    Birth control/protection: Post-menopausal  Lifestyle  . Physical activity:    Days per week: Not on file    Minutes per session: Not on file  . Stress: Not on file  Relationships  . Social connections:    Talks on phone: Not on file    Gets together: Not on file    Attends religious service: Not on file    Active member of club or organization: Not  on file    Attends meetings of clubs or organizations: Not on file    Relationship status: Not on file  Other Topics Concern  . Not on file  Social History Narrative  . Not on file   Family History  Problem Relation Age of Onset  . Sleep apnea Son   . Diabetes Son    Allergies  Allergen Reactions  . Morphine And Related Other (See Comments)    Patient stated that she thinks that morphine was one of the medication that she had a bad reaction to. She can not remember the other. She said that she blacked out when she took it.  . Other     Allergic to  med, does not know name   Prior to Admission medications   Medication Sig Start Date End Date Taking? Authorizing Provider  alendronate (FOSAMAX) 70 MG tablet Take 70 mg by mouth once a week. Take with a full glass of water on an empty stomach. 03/17/13  Yes [provider]  atenolol (TENORMIN) 50 MG tablet Take 50 mg by mouth daily. 05/15/13  Yes [provider]  celecoxib (CELEBREX) 200 MG capsule Take 200 mg by mouth daily after  breakfast. 05/04/18  Yes [provider]  diphenhydrAMINE (BENADRYL) 25 MG tablet Take 25 mg by mouth every 6 (six) hours as needed for allergies.   Yes [provider]  esomeprazole (NEXIUM) 40 MG capsule Take 40 mg by mouth daily after breakfast.    Yes [provider]  LORazepam (ATIVAN) 1 MG tablet Take 1 mg by mouth 4 (four) times daily. scheduled 05/21/12  Yes [provider]  oxybutynin (DITROPAN) 5 MG tablet Take 5 mg by mouth 2 (two) times a day. 06/06/18  Yes [provider]  oxyCODONE-acetaminophen (PERCOCET/ROXICET) 5-325 MG tablet Take 1 tablet by mouth 2 (two) times a day.    Yes [provider]  simvastatin (ZOCOR) 40 MG tablet Take 40 mg by mouth at bedtime. 05/15/13  Yes [provider]  tiZANidine (ZANAFLEX) 4 MG tablet Take 4 mg by mouth every 8 (eight) hours as needed for muscle spasms. 12/21/12  Yes [provider]  zolpidem (AMBIEN) 10 MG tablet Take 7.5 mg by mouth at bedtime.    Yes [provider]  solifenacin (VESICARE) 5 MG tablet Take 1 tablet (5 mg total) by mouth daily. Patient not taking: Reported on 07/13/2018 12/03/17   Estill Dooms, NP   Dg Chest 2 View  Result Date: 07/13/2018 CLINICAL DATA:  Recent fall with known left femoral fracture EXAM: CHEST - 2 VIEW COMPARISON:  03/05/2014, CT from 05/26/2017 FINDINGS: Cardiac shadow is within normal limits. Small hiatal hernia is again noted. Lungs are well aerated bilaterally. No focal infiltrate or effusion is seen. No pneumothorax is noted. No acute bony abnormality is seen. IMPRESSION: Small sliding-type hiatal hernia.  No acute abnormality noted. Electronically Signed   By: Inez Catalina M.D.   On: 07/13/2018 22:56   Ct Head Wo Contrast  Result Date: 07/13/2018 CLINICAL DATA:  Fall with head trauma. EXAM: CT HEAD WITHOUT CONTRAST TECHNIQUE: Contiguous axial images were obtained from the base of the skull through the vertex without intravenous contrast. COMPARISON:  04/28/2012 FINDINGS: Brain: There is no evidence for acute hemorrhage, hydrocephalus, mass lesion, or abnormal extra-axial fluid collection. No definite CT evidence for acute infarction. Vascular: No hyperdense vessel or unexpected calcification. Skull: No evidence for fracture. No worrisome lytic or sclerotic lesion. Sinuses/Orbits: The visualized paranasal sinuses and mastoid air cells are clear. Visualized portions of the globes and intraorbital fat are unremarkable. Other: None. IMPRESSION: 1. Stable exam.  No acute intracranial abnormality. Electronically Signed   By: Misty Stanley M.D.   On: 07/13/2018 23:07   Dg Hip Unilat W Or Wo Pelvis 2-3 Views Left  Result Date: 07/13/2018 CLINICAL DATA:  Recent fall with left hip pain, initial encounter EXAM: DG HIP (WITH OR WITHOUT PELVIS) 3V LEFT COMPARISON:  None. FINDINGS: Comminuted intratrochanteric left femoral fracture  is noted with impaction at the fracture site. Femoral head is well seated. Pelvic ring is intact. No soft tissue abnormality is noted. IMPRESSION: Comminuted intratrochanteric left femoral fracture. Electronically Signed   By: Inez Catalina M.D.   On: 07/13/2018 22:55    Positive ROS: All other systems have been reviewed and were otherwise negative with the exception of those mentioned in the HPI and as above.  Labs cbc Recent Labs    07/13/18 2208  WBC 9.1  HGB 11.6*  HCT 35.6*  PLT 194    Labs inflam No results for input(s): CRP in the last 72 hours.  Invalid input(s): ESR  Labs coag No results for input(s): INR,  PTT in the last 72 hours.  Invalid input(s): PT  Recent Labs    07/13/18 2208  NA 129*  K 5.4*  CL 101  CO2 21*  GLUCOSE 105*  BUN 29*  CREATININE 1.18*  CALCIUM 7.8*    Physical Exam: Vitals:   07/14/18 0323 07/14/18 0355  BP: (!) 175/98 (!) 155/84  Pulse: 78 77  Resp: 17   Temp:  98.8 F (37.1 C)  SpO2: 99% 98%   General: Alert, no acute distress Cardiovascular: No pedal edema Respiratory: No cyanosis, no use of accessory musculature GI: No organomegaly, abdomen is soft and non-tender Skin: No lesions in the area of chief complaint other than those listed below in MSK exam.  Neurologic: Sensation intact distally save for the below mentioned MSK exam Psychiatric: Patient is competent for consent with normal mood and affect Lymphatic: No axillary or cervical lymphadenopathy  MUSCULOSKELETAL:  LLE: NVI, comparmtnes soft Other extremities are atraumatic with painless ROM and NVI.  Assessment: L hip fracture  Plan: IM nail today   Renette Butters, MD Cell [phone]   07/14/2018 7:29 AM

## 2018-07-14 NOTE — Progress Notes (Signed)
Pt left floor for surgery via bed @ 0640. Alert and verbal. No s/s of respiratory distress noted.

## 2018-07-14 NOTE — ED Notes (Signed)
Patient transported off the floor to [facility] via [facility].

## 2018-07-14 NOTE — Anesthesia Preprocedure Evaluation (Addendum)
Anesthesia Evaluation  Patient identified by MRN, date of birth, ID band Patient awake    Reviewed: Allergy & Precautions, NPO status , Patient's Chart, lab work & pertinent test results  History of Anesthesia Complications Negative for: history of anesthetic complications  Airway Mallampati: II  TM Distance: >3 FB Neck ROM: Full    Dental  (+) Dental Advisory Given   Pulmonary neg pulmonary ROS,    breath sounds clear to auscultation       Cardiovascular hypertension, Pt. on medications  Rhythm:Regular     Neuro/Psych PSYCHIATRIC DISORDERS Anxiety Depression  Neuromuscular disease    GI/Hepatic Neg liver ROS, GERD  Medicated and Controlled,  Endo/Other  negative endocrine ROS  Renal/GU Renal InsufficiencyRenal disease     Musculoskeletal   Abdominal   Peds  Hematology negative hematology ROS (+)   Anesthesia Other Findings   Reproductive/Obstetrics                            Anesthesia Physical Anesthesia Plan  ASA: II  Anesthesia Plan: General   Post-op Pain Management:    Induction: Intravenous  PONV Risk Score and Plan: 3 and Ondansetron and Dexamethasone  Airway Management Planned: Oral ETT  Additional Equipment: None  Intra-op Plan:   Post-operative Plan: Extubation in OR  Informed Consent: I have reviewed the patients History and Physical, chart, labs and discussed the procedure including the risks, benefits and alternatives for the proposed anesthesia with the patient or authorized representative who has indicated his/her understanding and acceptance.     Dental advisory given  Plan Discussed with: CRNA and Surgeon  Anesthesia Plan Comments:         Anesthesia Quick Evaluation

## 2018-07-14 NOTE — Transfer of Care (Signed)
Immediate Anesthesia Transfer of Care Note  Patient: [name] [name]  Procedure(s) Performed: INTRAMEDULLARY (IM) NAIL, LEFT HIP (Left Hip)  Patient Location: PACU  Anesthesia Type:General  Level of Consciousness: awake and alert   Airway & Oxygen Therapy: Patient Spontanous Breathing and Patient connected to face mask oxygen  Post-op Assessment: Report given to RN and Post -op Vital signs reviewed and stable  Post vital signs: Reviewed and stable  Last Vitals:  Vitals Value Taken Time  BP 130/77 07/14/2018  9:31 AM  Temp    Pulse 71 07/14/2018  9:31 AM  Resp 11 07/14/2018  9:31 AM  SpO2 100 % 07/14/2018  9:31 AM  Vitals shown include unvalidated device data.  Last Pain:  Vitals:   07/14/18 0400  TempSrc: Oral  PainSc:          Complications: No apparent anesthesia complications

## 2018-07-14 NOTE — Progress Notes (Signed)
Pt arrived to floor @ 0345 via stretcher by [facility] from [facility]. Pt alert and verbal. No s/s of respiratory distress noted.

## 2018-07-14 NOTE — Progress Notes (Signed)
Attempted x 3 to give report to Short Stay for pt for surgery.

## 2018-07-14 NOTE — Op Note (Signed)
DATE OF SURGERY:  07/14/2018  TIME: 8:52 AM  PATIENT NAME:  [name] [name]  AGE: 78 y.o.  PRE-OPERATIVE DIAGNOSIS:  LEFT HIP FRACTURE  POST-OPERATIVE DIAGNOSIS:  SAME  PROCEDURE:  INTRAMEDULLARY (IM) NAIL, LEFT HIP  SURGEON:  Renette Butters  ASSISTANT:  Roxan Hockey, PA-C, he was present and scrubbed throughout the case, critical for completion in a timely fashion, and for retraction, instrumentation, and closure.   OPERATIVE IMPLANTS: [employer]ker Gamma Nail  PREOPERATIVE INDICATIONS:  [name] is a 78 y.o. year old who fell and suffered a hip fracture. She was brought into the ER and then admitted and optimized and then elected for surgical intervention.    The risks benefits and alternatives were discussed with the patient including but not limited to the risks of nonoperative treatment, versus surgical intervention including infection, bleeding, nerve injury, malunion, nonunion, hardware prominence, hardware failure, need for hardware removal, blood clots, cardiopulmonary complications, morbidity, mortality, among others, and they were willing to proceed.    OPERATIVE PROCEDURE:  The patient was brought to the operating room and placed in the supine position. General anesthesia was administered. She was placed on the fracture table.  Closed reduction was performed under C-arm guidance. Time out was then performed after sterile prep and drape. She received preoperative antibiotics.  Incision was made proximal to the greater trochanter. A guidewire was placed in the appropriate position. Confirmation was made on AP and lateral views. The above-named nail was opened. I opened the proximal femur with a reamer. I then placed the nail by hand easily down. I did not need to ream the femur.  Once the nail was completely seated, I placed a guidepin into the femoral head into the center center position. I measured the length, and then reamed the lateral cortex and up into the head. I  then placed the lag screw. Slight compression was applied. Anatomic fixation achieved. Bone quality was mediocre.  I then secured the proximal interlocking bolt, and took off a half a turn, and then removed the instruments, and took final C-arm pictures AP and lateral the entire length of the leg.  I then used perfect circles technique to place a distal interlock screw.   Anatomic reconstruction was achieved, and the wounds were irrigated copiously and closed with Vicryl followed by staples and sterile gauze for the skin. The patient was awakened and returned to PACU in stable and satisfactory condition. There no complications and the patient tolerated the procedure well.  She will be weightbearing as tolerated, and will be on chemical px  for a period of four weeks after discharge.   [name], M.D.

## 2018-07-14 NOTE — H&P (Signed)
History and Physical    [name] [name] DDU:[id number] DOB: 07-27-1940 DOA: 07/13/2018  PCP: Sinda Du, MD  Patient coming from: Home  Chief Complaint: Fall and left leg pain  HPI: [name] [name] is a 78 y.o. female with medical history significant of hypertension, hyperlipidemia was shopping today for groceries when she slipped in the aisle and fell injuring her left leg.  She did not trip or fall anything she states she just lost her footing and fell.  She did hit her head but did not have loss of consciousness.  She denies any previous illnesses no previous respiratory illnesses or shortness of breath cough nausea vomiting or diarrhea.  Patient found to have a left femur fracture referred for admission for such.  Orthopedic surgery has been consulted planning on taking her to the OR at [facility] tomorrow.  Therefore patient be transferred to [facility] from [facility] tonight.  Review of Systems: As per HPI otherwise 10 point review of systems negative.   Past Medical History:  Diagnosis Date  . Anxiety   . Depression   . GERD (gastroesophageal reflux disease)   . Hypercholesteremia   . Hypertension     Past Surgical History:  Procedure Laterality Date  . BREAST REDUCTION SURGERY    . CESAREAN SECTION    . COLONOSCOPY  08/2005  . ESOPHAGOGASTRODUODENOSCOPY N/A 08/19/2013   Procedure: ESOPHAGOGASTRODUODENOSCOPY (EGD);  Surgeon: Rogene Houston, MD;  Location: [facility];  Service: Endoscopy;  Laterality: N/A;  11:15 - rescheduled to 7:30 - Ann notified pt  . excision of submandibular gland    . HEMORRHOID SURGERY    . STOMACH SURGERY     years ago. ? reason  . TONSILLECTOMY       reports that she has never smoked. She has never used smokeless tobacco. She reports that she does not drink alcohol or use drugs.  Allergies  Allergen Reactions  . Morphine And Related Other (See Comments)    Patient stated that she thinks that morphine was one of the medication that she  had a bad reaction to. She can not remember the other. She said that she blacked out when she took it.  . Other     Allergic to  med, does not know name    Family History  Problem Relation Age of Onset  . Sleep apnea Son   . Diabetes Son     Prior to Admission medications   Medication Sig Start Date End Date Taking? Authorizing Provider  alendronate (FOSAMAX) 70 MG tablet Take 70 mg by mouth once a week. Take with a full glass of water on an empty stomach. 03/17/13  Yes [provider]  atenolol (TENORMIN) 50 MG tablet Take 50 mg by mouth daily. 05/15/13  Yes [provider]  celecoxib (CELEBREX) 200 MG capsule Take 200 mg by mouth daily after breakfast. 05/04/18  Yes [provider]  diphenhydrAMINE (BENADRYL) 25 MG tablet Take 25 mg by mouth every 6 (six) hours as needed for allergies.   Yes [provider]  esomeprazole (NEXIUM) 40 MG capsule Take 40 mg by mouth daily after breakfast.    Yes [provider]  LORazepam (ATIVAN) 1 MG tablet Take 1 mg by mouth 4 (four) times daily. scheduled 05/21/12  Yes [provider]  oxybutynin (DITROPAN) 5 MG tablet Take 5 mg by mouth 2 (two) times a day. 06/06/18  Yes [provider]  oxyCODONE-acetaminophen (PERCOCET/ROXICET) 5-325 MG tablet Take 1  tablet by mouth 2 (two) times a day.    Yes [provider]  simvastatin (ZOCOR) 40 MG tablet Take 40 mg by mouth at bedtime. 05/15/13  Yes [provider]  tiZANidine (ZANAFLEX) 4 MG tablet Take 4 mg by mouth every 8 (eight) hours as needed for muscle spasms. 12/21/12  Yes [provider]  zolpidem (AMBIEN) 10 MG tablet Take 7.5 mg by mouth at bedtime.    Yes [provider]  solifenacin (VESICARE) 5 MG tablet Take 1 tablet (5 mg total) by mouth daily. Patient not taking: Reported on 07/13/2018 12/03/17   Estill Dooms, NP    Physical Exam: Vitals:   07/13/18 2139 07/13/18 2322  BP: 140/82 (!) 161/88   Pulse: 66 73  Resp: 16 20  Temp: (!) 97.5 F (36.4 C)   TempSrc: Oral   SpO2: 99% 99%  Weight: 44.9 kg       Constitutional: NAD, calm, comfortable Vitals:   07/13/18 2139 07/13/18 2322  BP: 140/82 (!) 161/88  Pulse: 66 73  Resp: 16 20  Temp: (!) 97.5 F (36.4 C)   TempSrc: Oral   SpO2: 99% 99%  Weight: 44.9 kg    Eyes: PERRL, lids and conjunctivae normal ENMT: Mucous membranes are moist. Posterior pharynx clear of any exudate or lesions.Normal dentition.  Neck: normal, supple, no masses, no thyromegaly Respiratory: clear to auscultation bilaterally, no wheezing, no crackles. Normal respiratory effort. No accessory muscle use.  Cardiovascular: Regular rate and rhythm, no murmurs / rubs / gallops. No extremity edema. 2+ pedal pulses. No carotid bruits.  Abdomen: no tenderness, no masses palpated. No hepatosplenomegaly. Bowel sounds positive.  Musculoskeletal: no clubbing / cyanosis. No joint deformity upper and lower extremities except left lower extremity rotated externally. Good ROM except left leg, no contractures. Normal muscle tone.  Skin: no rashes, lesions, ulcers. No induration Neurologic: CN 2-12 grossly intact. Sensation intact, DTR normal. Strength 5/5 in all 4.  Psychiatric: Normal judgment and insight. Alert and oriented x 3. Normal mood.    Labs on Admission: I have personally reviewed following labs and imaging studies  CBC: Recent Labs  Lab 07/13/18 2208  WBC 9.1  NEUTROABS 6.3  HGB 11.6*  HCT 35.6*  MCV 100.8*  PLT 607   Basic Metabolic Panel: Recent Labs  Lab 07/13/18 2208  NA 129*  K 5.4*  CL 101  CO2 21*  GLUCOSE 105*  BUN 29*  CREATININE 1.18*  CALCIUM 7.8*   GFR: CrCl cannot be calculated (Unknown ideal weight.). Liver Function Tests: No results for input(s): AST, ALT, ALKPHOS, BILITOT, PROT, ALBUMIN in the last 168 hours. No results for input(s): LIPASE, AMYLASE in the last 168 hours. No results for input(s): AMMONIA in the  last 168 hours. Coagulation Profile: No results for input(s): INR, PROTIME in the last 168 hours. Cardiac Enzymes: No results for input(s): CKTOTAL, CKMB, CKMBINDEX, TROPONINI in the last 168 hours. BNP (last 3 results) No results for input(s): PROBNP in the last 8760 hours. HbA1C: No results for input(s): HGBA1C in the last 72 hours. CBG: No results for input(s): GLUCAP in the last 168 hours. Lipid Profile: No results for input(s): CHOL, HDL, LDLCALC, TRIG, CHOLHDL, LDLDIRECT in the last 72 hours. Thyroid Function Tests: No results for input(s): TSH, T4TOTAL, FREET4, T3FREE, THYROIDAB in the last 72 hours. Anemia Panel: No results for input(s): VITAMINB12, FOLATE, FERRITIN, TIBC, IRON, RETICCTPCT in the last 72 hours. Urine analysis: No results found for: COLORURINE, APPEARANCEUR, [place], [place], [place],  HGBUR, BILIRUBINUR, KETONESUR, PROTEINUR, UROBILINOGEN, NITRITE, LEUKOCYTESUR Sepsis Labs: !!!!!!!!!!!!!!!!!!!!!!!!!!!!!!!!!!!!!!!!!!!! @LABRCNTIP (procalcitonin:4,lacticidven:4) )No results found for this or any previous visit (from the past 240 hour(s)).   Radiological Exams on Admission: Dg Chest 2 View  Result Date: 07/13/2018 CLINICAL DATA:  Recent fall with known left femoral fracture EXAM: CHEST - 2 VIEW COMPARISON:  03/05/2014, CT from 05/26/2017 FINDINGS: Cardiac shadow is within normal limits. Small hiatal hernia is again noted. Lungs are well aerated bilaterally. No focal infiltrate or effusion is seen. No pneumothorax is noted. No acute bony abnormality is seen. IMPRESSION: Small sliding-type hiatal hernia.  No acute abnormality noted. Electronically Signed   By: Inez Catalina M.D.   On: 07/13/2018 22:56   Ct Head Wo Contrast  Result Date: 07/13/2018 CLINICAL DATA:  Fall with head trauma. EXAM: CT HEAD WITHOUT CONTRAST TECHNIQUE: Contiguous axial images were obtained from the base of the skull through the vertex without intravenous contrast. COMPARISON:  04/28/2012  FINDINGS: Brain: There is no evidence for acute hemorrhage, hydrocephalus, mass lesion, or abnormal extra-axial fluid collection. No definite CT evidence for acute infarction. Vascular: No hyperdense vessel or unexpected calcification. Skull: No evidence for fracture. No worrisome lytic or sclerotic lesion. Sinuses/Orbits: The visualized paranasal sinuses and mastoid air cells are clear. Visualized portions of the globes and intraorbital fat are unremarkable. Other: None. IMPRESSION: 1. Stable exam.  No acute intracranial abnormality. Electronically Signed   By: Misty Stanley M.D.   On: 07/13/2018 23:07   Dg Hip Unilat W Or Wo Pelvis 2-3 Views Left  Result Date: 07/13/2018 CLINICAL DATA:  Recent fall with left hip pain, initial encounter EXAM: DG HIP (WITH OR WITHOUT PELVIS) 3V LEFT COMPARISON:  None. FINDINGS: Comminuted intratrochanteric left femoral fracture is noted with impaction at the fracture site. Femoral head is well seated. Pelvic ring is intact. No soft tissue abnormality is noted. IMPRESSION: Comminuted intratrochanteric left femoral fracture. Electronically Signed   By: Inez Catalina M.D.   On: 07/13/2018 22:55    EKG: Independently reviewed.  Normal sinus rhythm no acute changes Old chart reviewed Case discussed with EDP Chest x-ray reviewed no edema or infiltrate  Assessment/Plan 78 year old female with mechanical fall with a left femur fracture Principal Problem:   Hip fracture (HCC)-femur fracture left.  Keep n.p.o.  Transfer to [facility] for operative repair in the morning.  All antiplatelet and anticoagulant medications and address postoperatively.  COVID testing screening is pending.  Patient asymptomatic however and has no respiratory symptoms.  PRN Dilaudid and Zofran ordered.  Also muscle relaxer.  Also bowel regimen ordered.  Active Problems:   GERD (gastroesophageal reflux disease)-resume Nexium postop   Hypertension-holding oral medication at this time blood pressure  controlled   Hypercholesteremia-resume statin postop      DVT prophylaxis: SCDs Code Status: Full Family Communication: None Disposition Plan: At least 3 days Consults called: Orthopedic surgery Admission status: Admission   Ashlyne Olenick A MD [facility]  If 7PM-7AM, please contact night-coverage [email or web address] Password [email or web address]  07/14/2018, 12:01 AM

## 2018-07-15 LAB — BASIC METABOLIC PANEL
Anion gap: 9 (ref 5–15)
BUN: 24 mg/dL — ABNORMAL HIGH (ref 8–23)
CO2: 21 mmol/L — ABNORMAL LOW (ref 22–32)
Calcium: 7.6 mg/dL — ABNORMAL LOW (ref 8.9–10.3)
Chloride: 102 mmol/L (ref 98–111)
Creatinine, Ser: 1.06 mg/dL — ABNORMAL HIGH (ref 0.44–1.00)
GFR calc Af Amer: 58 mL/min — ABNORMAL LOW (ref >60)
GFR calc non Af Amer: 50 mL/min — ABNORMAL LOW (ref >60)
Glucose, Bld: 150 mg/dL — ABNORMAL HIGH (ref 70–99)
Potassium: 4.6 mmol/L (ref 3.5–5.1)
Sodium: 132 mmol/L — ABNORMAL LOW (ref 135–145)

## 2018-07-15 LAB — CBC
HCT: 28.7 % — ABNORMAL LOW (ref 36.0–46.0)
Hemoglobin: 9.4 g/dL — ABNORMAL LOW (ref 12.0–15.0)
MCH: 32.4 pg (ref 26.0–34.0)
MCHC: 32.8 g/dL (ref 30.0–36.0)
MCV: 99 fL (ref 80.0–100.0)
Platelets: 181 10*3/uL (ref 150–400)
RBC: 2.9 MIL/uL — ABNORMAL LOW (ref 3.87–5.11)
RDW: 12.2 % (ref 11.5–15.5)
WBC: 13.7 10*3/uL — ABNORMAL HIGH (ref 4.0–10.5)
nRBC: 0 % (ref 0.0–0.2)

## 2018-07-15 NOTE — Evaluation (Signed)
Occupational Therapy Evaluation Patient Details Name: [name] [name] MRN: [id number] DOB: January 25, 1941 Today's Date: 07/15/2018    History of Present Illness Patient is a 78 y/o female with Comminuted intratrochanteric left femoral fracture s/p L IM nail on 07/14/18. Past medical history significant of hypertension, hyperlipidemia, anxiety/depression.    Clinical Impression   Patient is s/p L Im nail surgery resulting in functional limitations due to the deficits listed below (see OT problem list). Pt currently total +2 max (A) to transfer to chair. Pt needs redirection throughout session due to tangential on family history regarding numerous traumas.  Patient will benefit from skilled OT acutely to increase independence and safety with ADLS to allow discharge SNF.     Follow Up Recommendations  SNF    Equipment Recommendations  3 in 1 bedside commode;Wheelchair (measurements OT);Wheelchair cushion (measurements OT);Hospital bed    Recommendations for Other Services       Precautions / Restrictions Precautions Precautions: Fall Restrictions Weight Bearing Restrictions: Yes LLE Weight Bearing: Weight bearing as tolerated      Mobility Bed Mobility Overal bed mobility: Needs Assistance Bed Mobility: Supine to Sit     Supine to sit: Max assist;+2 for physical assistance     General bed mobility comments: Max A +2 with use of bed pad to come to EOB; was able to guide R LE towards edge; Initially with Min/Mod sitting EOB with progression to supervision  Transfers Overall transfer level: Needs assistance Equipment used: Rolling walker (2 wheeled) Transfers: Sit to/from [facility] Sit to Stand: Mod assist;Max assist;+2 physical assistance Stand pivot transfers: Mod assist;Max assist;+2 physical assistance       General transfer comment: physical assist needed to power up into standing; difficulty with weight bearing due to pain; Cueing throughout for safety  and sequencing during transfer    Balance Overall balance assessment: Needs assistance Sitting-balance support: Bilateral upper extremity supported;Feet supported Sitting balance-Leahy Scale: Fair     Standing balance support: Bilateral upper extremity supported;During functional activity Standing balance-Leahy Scale: Poor Standing balance comment: reliant on UE and external support                           ADL either performed or assessed with clinical judgement   ADL Overall ADL's : Needs assistance/impaired Eating/Feeding: Modified independent   Grooming: Wash/dry hands;Wash/dry face;Modified independent   Upper Body Bathing: Minimal assistance   Lower Body Bathing: Maximal assistance   Upper Body Dressing : Minimal assistance   Lower Body Dressing: Maximal assistance   Toilet Transfer: +2 for physical assistance;Maximal assistance             General ADL Comments: pt transfered from EOB to chair this session. pt needs increase time to redirect due to very detailed stories regarding numberous traumas in her life     Vision         Perception     Praxis      Pertinent Vitals/Pain Pain Assessment: Faces Faces Pain Scale: Hurts even more Pain Location: L hip Pain Descriptors / Indicators: Aching;Discomfort;Grimacing;Guarding     Hand Dominance Right   Extremity/Trunk Assessment Upper Extremity Assessment Upper Extremity Assessment: Overall WFL for tasks assessed   Lower Extremity Assessment Lower Extremity Assessment: Defer to PT evaluation   Cervical / Trunk Assessment Cervical / Trunk Assessment: Normal   Communication Communication Communication: No difficulties   Cognition Arousal/Alertness: Awake/alert Behavior During Therapy: WFL for tasks assessed/performed Overall Cognitive Status: Within  Functional Limits for tasks assessed                                     General Comments  increase risk for skin break  down due to decr mobility at this time.     Exercises     Shoulder Instructions      Home Living Family/patient expects to be discharged to:: Private residence Living Arrangements: Children(son) Available Help at Discharge: Family;Available 24 hours/day Type of Home: House Home Access: Stairs to enter [employer] of Steps: 2 Entrance Stairs-Rails: None Home Layout: One level     Bathroom Shower/Tub: Tub/shower unit(step in)   Biochemist, clinical: Standard     Home Equipment: Environmental consultant - 2 wheels;Cane - single point;Bedside commode;Shower seat   Additional Comments: wears prescription glasses      Prior Functioning/Environment Level of Independence: Independent        Comments: use of AD due to knee pain; son drives        OT Problem List: Decreased strength;Decreased activity tolerance;Impaired balance (sitting and/or standing);Decreased safety awareness;Decreased knowledge of use of DME or AE;Decreased knowledge of precautions;Pain      OT Treatment/Interventions: Self-care/ADL training;Therapeutic exercise;Neuromuscular education;Energy conservation;DME and/or AE instruction;Manual therapy;Modalities;Therapeutic activities;Patient/family education;Balance training    OT Goals(Current goals can be found in the care plan section) Acute Rehab OT Goals Patient Stated Goal: to not have pain OT Goal Formulation: With patient Time For Goal Achievement: 07/29/18 Potential to Achieve Goals: Good  OT Frequency: Min 2X/week   Barriers to D/C: Other (comment)(son with health deficits per patient)          Co-evaluation PT/OT/SLP Co-Evaluation/Treatment: Yes Reason for Co-Treatment: For patient/therapist safety;To address functional/ADL transfers   OT goals addressed during session: ADL's and self-care;Proper use of Adaptive equipment and DME;Strengthening/ROM      AM-PAC OT "6 Clicks" Daily Activity     Outcome Measure Help from another person eating meals?:  None Help from another person taking care of personal grooming?: None Help from another person toileting, which includes using toliet, bedpan, or urinal?: A Lot Help from another person bathing (including washing, rinsing, drying)?: A Lot Help from another person to put on and taking off regular upper body clothing?: A Little Help from another person to put on and taking off regular lower body clothing?: Total 6 Click Score: 16   End of Session Equipment Utilized During Treatment: Gait belt;Rolling walker Nurse Communication: Mobility status;Precautions  Activity Tolerance: Patient tolerated treatment well Patient left: in chair;with call bell/phone within reach;with chair alarm set  OT Visit Diagnosis: Unsteadiness on feet (R26.81);Muscle weakness (generalized) (M62.81)                Time: 5993-5701 OT Time Calculation (min): 34 min Charges:  OT General Charges $OT Visit: 1 Visit OT Evaluation $OT Eval Moderate Complexity: 1 Mod   Jeri Modena, OTR/L  Acute Rehabilitation Services Pager: [phone] Office: [phone] .   Jeri Modena 07/15/2018, 1:45 PM

## 2018-07-15 NOTE — Progress Notes (Signed)
PROGRESS NOTE    [name] [name]  HLK:[id number] DOB: 1941/01/25 DOA: 07/13/2018 PCP: Sinda Du, MD    Brief Narrative:  78 y.o. female with medical history significant of hypertension, hyperlipidemia was shopping today for groceries when she slipped in the aisle and fell injuring her left leg.  She did not trip or fall anything she states she just lost her footing and fell.  She did hit her head but did not have loss of consciousness.  She denies any previous illnesses no previous respiratory illnesses or shortness of breath cough nausea vomiting or diarrhea.  Patient found to have a left femur fracture referred for admission for such.  Orthopedic surgery has been consulted planning on taking her to the OR at [facility] tomorrow.  Therefore patient be transferred to [facility] tonight.  Assessment & Plan:   Principal Problem:   Hip fracture ([place]) Active Problems:   GERD (gastroesophageal reflux disease)   Hypertension   Hypercholesteremia    Hip fracture (HCC) -Left femur fracture -Orthopedic Surgery following and pt now s/p surgery 5/24 -Continue analgesic as needed -PT/OT consulted with recommendations for SNF. SW following    GERD (gastroesophageal reflux disease) -Will resume PPI  -Currently stable    Hypertension -BP suboptimally controlled overnight -cont current BP regimen    Hypercholesteremia -will continue statin per home regimen  Anxiety -Pt noted to have 1mg  ativan 4 time daily scheduled on home med rec -cont on PRN ativan while in hospital. Stable  DVT prophylaxis: Lovenox subQ Code Status: Full Family Communication: Pt in room, family not at bedside Disposition Plan: Uncertain at this time, pending PT eval  Consultants:   Orthopedic Surgery  Procedures:   L hip intramedullary nail 5/24  Antimicrobials: Anti-infectives (From admission, onward)   Start     Dose/Rate Route Frequency Ordered Stop   07/14/18 1030  ceFAZolin  (ANCEF) IVPB 2g/100 mL premix     2 g 200 mL/hr over 30 Minutes Intravenous Every 6 hours 07/14/18 1021 07/14/18 1818   07/14/18 0733  ceFAZolin (ANCEF) 2-4 GM/100ML-% IVPB    Note to Pharmacy:  Derinda Sis   : cabinet override      07/14/18 0733 07/14/18 1944      Subjective: Complains of feeling dizzy when working with PT/OT, after experiencing marked pain  Objective: Vitals:   07/14/18 1952 07/15/18 0041 07/15/18 0409 07/15/18 1420  BP: 139/79 (!) 149/77 128/72 118/65  Pulse: 76 66 61 61  Resp:    16  Temp: 98.2 F (36.8 C) 98.5 F (36.9 C) 98.7 F (37.1 C) 98.3 F (36.8 C)  TempSrc: Oral Oral Oral Oral  SpO2: 99% 98% 97% 98%  Weight:      Height:        Intake/Output Summary (Last 24 hours) at 07/15/2018 1450 Last data filed at 07/15/2018 1426 Gross per 24 hour  Intake 1077.24 ml  Output 2700 ml  Net -1622.76 ml   Filed Weights   07/13/18 2139 07/14/18 0400  Weight: 44.9 kg 44.9 kg    Examination: General exam: Awake, laying in bed, in nad Respiratory system: Normal respiratory effort, no wheezing Cardiovascular system: regular rate, s1, s2 Gastrointestinal system: Soft, nondistended, positive BS Central nervous system: CN2-12 grossly intact, strength intact Extremities: Perfused, no clubbing Skin: Normal skin turgor, no notable skin lesions seen Psychiatry: Mood normal // no visual hallucinations   Data Reviewed: I have personally reviewed following labs and imaging studies  CBC: Recent Labs  Lab 07/13/18 2208 07/14/18 1126 07/15/18 0959  WBC 9.1 13.7* 13.7*  NEUTROABS 6.3  --   --   HGB 11.6* 11.0* 9.4*  HCT 35.6* 33.2* 28.7*  MCV 100.8* 97.6 99.0  PLT 194 189 937   Basic Metabolic Panel: Recent Labs  Lab 07/13/18 2208 07/14/18 1126 07/15/18 0959  NA 129*  --  132*  K 5.4*  --  4.6  CL 101  --  102  CO2 21*  --  21*  GLUCOSE 105*  --  150*  BUN 29*  --  24*  CREATININE 1.18* 1.11* 1.06*  CALCIUM 7.8*  --  7.6*    GFR: Estimated Creatinine Clearance: 29.8 mL/min (A) (by C-G formula based on SCr of 1.06 mg/dL (H)). Liver Function Tests: No results for input(s): AST, ALT, ALKPHOS, BILITOT, PROT, ALBUMIN in the last 168 hours. No results for input(s): LIPASE, AMYLASE in the last 168 hours. No results for input(s): AMMONIA in the last 168 hours. Coagulation Profile: No results for input(s): INR, PROTIME in the last 168 hours. Cardiac Enzymes: No results for input(s): CKTOTAL, CKMB, CKMBINDEX, TROPONINI in the last 168 hours. BNP (last 3 results) No results for input(s): PROBNP in the last 8760 hours. HbA1C: No results for input(s): HGBA1C in the last 72 hours. CBG: No results for input(s): GLUCAP in the last 168 hours. Lipid Profile: No results for input(s): CHOL, HDL, LDLCALC, TRIG, CHOLHDL, LDLDIRECT in the last 72 hours. Thyroid Function Tests: No results for input(s): TSH, T4TOTAL, FREET4, T3FREE, THYROIDAB in the last 72 hours. Anemia Panel: No results for input(s): VITAMINB12, FOLATE, FERRITIN, TIBC, IRON, RETICCTPCT in the last 72 hours. Sepsis Labs: No results for input(s): PROCALCITON, LATICACIDVEN in the last 168 hours.  Recent Results (from the past 240 hour(s))  SARS Coronavirus 2 (CEPHEID - Performed in [place] hospital lab), Hosp Order     Status: None   Collection Time: 07/14/18 12:31 AM  Result Value Ref Range Status   SARS Coronavirus 2 NEGATIVE NEGATIVE Final    Comment: (NOTE) If result is NEGATIVE SARS-CoV-2 target nucleic acids are NOT DETECTED. The SARS-CoV-2 RNA is generally detectable in upper and lower  respiratory specimens during the acute phase of infection. The lowest  concentration of SARS-CoV-2 viral copies this assay can detect is 250  copies / mL. A negative result does not preclude SARS-CoV-2 infection  and should not be used as the sole basis for treatment or other  patient management decisions.  A negative result may occur with  improper specimen  collection / handling, submission of specimen other  than nasopharyngeal swab, presence of viral mutation(s) within the  areas targeted by this assay, and inadequate number of viral copies  (<250 copies / mL). A negative result must be combined with clinical  observations, patient history, and epidemiological information. If result is POSITIVE SARS-CoV-2 target nucleic acids are DETECTED. The SARS-CoV-2 RNA is generally detectable in upper and lower  respiratory specimens dur ing the acute phase of infection.  Positive  results are indicative of active infection with SARS-CoV-2.  Clinical  correlation with patient history and other diagnostic information is  necessary to determine patient infection status.  Positive results do  not rule out bacterial infection or co-infection with other viruses. If result is PRESUMPTIVE POSTIVE SARS-CoV-2 nucleic acids MAY BE PRESENT.   A presumptive positive result was obtained on the submitted specimen  and confirmed on repeat testing.  While 2019 novel coronavirus  (SARS-CoV-2) nucleic acids may be  present in the submitted sample  additional confirmatory testing may be necessary for epidemiological  and / or clinical management purposes  to differentiate between  SARS-CoV-2 and other Sarbecovirus currently known to infect humans.  If clinically indicated additional testing with an alternate test  methodology [id number]) is advised. The SARS-CoV-2 RNA is generally  detectable in upper and lower respiratory sp ecimens during the acute  phase of infection. The expected result is Negative. Fact Sheet for Patients:  StrictlyIdeas.no Fact Sheet for Healthcare Providers: BankingDealers.co.za This test is not yet approved or cleared by the [place] FDA and has been authorized for detection and/or diagnosis of SARS-CoV-2 by FDA under an Emergency Use Authorization (EUA).  This EUA will remain in effect  (meaning this test can be used) for the duration of the COVID-19 declaration under Section 564(b)(1) of the Act, 21 U.S.C. section 360bbb-3(b)(1), unless the authorization is terminated or revoked sooner. Performed at [facility], [place] [address] [place], [place] [address]   Surgical PCR screen     Status: None   Collection Time: 07/14/18  4:45 AM  Result Value Ref Range Status   MRSA, PCR NEGATIVE NEGATIVE Final   Staphylococcus aureus NEGATIVE NEGATIVE Final    Comment: (NOTE) The Xpert SA Assay (FDA approved for NASAL specimens in patients 38 years of age and older), is one component of a comprehensive surveillance program. It is not intended to diagnose infection nor to guide or monitor treatment. Performed at [facility], [place] [address] [place], [place] [address]      Radiology Studies: Dg Chest 2 View  Result Date: 07/13/2018 CLINICAL DATA:  Recent fall with known left femoral fracture EXAM: CHEST - 2 VIEW COMPARISON:  03/05/2014, CT from 05/26/2017 FINDINGS: Cardiac shadow is within normal limits. Small hiatal hernia is again noted. Lungs are well aerated bilaterally. No focal infiltrate or effusion is seen. No pneumothorax is noted. No acute bony abnormality is seen. IMPRESSION: Small sliding-type hiatal hernia.  No acute abnormality noted. Electronically Signed   By: Inez Catalina M.D.   On: 07/13/2018 22:56   Ct Head Wo Contrast  Result Date: 07/13/2018 CLINICAL DATA:  Fall with head trauma. EXAM: CT HEAD WITHOUT CONTRAST TECHNIQUE: Contiguous axial images were obtained from the base of the skull through the vertex without intravenous contrast. COMPARISON:  04/28/2012 FINDINGS: Brain: There is no evidence for acute hemorrhage, hydrocephalus, mass lesion, or abnormal extra-axial fluid collection. No definite CT evidence for acute infarction. Vascular: No hyperdense vessel or unexpected calcification. Skull: No evidence for fracture. No worrisome lytic  or sclerotic lesion. Sinuses/Orbits: The visualized paranasal sinuses and mastoid air cells are clear. Visualized portions of the globes and intraorbital fat are unremarkable. Other: None. IMPRESSION: 1. Stable exam.  No acute intracranial abnormality. Electronically Signed   By: Misty Stanley M.D.   On: 07/13/2018 23:07   Dg C-arm 1-60 Min  Result Date: 07/14/2018 CLINICAL DATA:  78 year old female with fracture EXAM: DG C-ARM 61-120 MIN; LEFT FEMUR 2 VIEWS COMPARISON:  07/13/2018 FINDINGS: Multiple intraoperative fluoroscopic spot images demonstrate ORIF of left hip fracture with antegrade intramedullary rod and cannulated screw. IMPRESSION: Limited intraoperative fluoroscopic spot images demonstrating ORIF of left hip. Please refer to the dictated operative report for full details of intraoperative findings and procedure. Electronically Signed   By: Corrie Mckusick D.O.   On: 07/14/2018 09:23   Dg Hip Unilat W Or Wo Pelvis 2-3 Views Left  Result Date: 07/13/2018 CLINICAL DATA:  Recent fall with left hip pain, initial encounter EXAM: DG HIP (WITH OR WITHOUT PELVIS) 3V LEFT COMPARISON:  None. FINDINGS: Comminuted intratrochanteric left femoral fracture is noted with impaction at the fracture site. Femoral head is well seated. Pelvic ring is intact. No soft tissue abnormality is noted. IMPRESSION: Comminuted intratrochanteric left femoral fracture. Electronically Signed   By: Inez Catalina M.D.   On: 07/13/2018 22:55   Dg Femur Min 2 Views Left  Result Date: 07/14/2018 CLINICAL DATA:  78 year old female with fracture EXAM: DG C-ARM 61-120 MIN; LEFT FEMUR 2 VIEWS COMPARISON:  07/13/2018 FINDINGS: Multiple intraoperative fluoroscopic spot images demonstrate ORIF of left hip fracture with antegrade intramedullary rod and cannulated screw. IMPRESSION: Limited intraoperative fluoroscopic spot images demonstrating ORIF of left hip. Please refer to the dictated operative report for full details of intraoperative  findings and procedure. Electronically Signed   By: Corrie Mckusick D.O.   On: 07/14/2018 09:23    Scheduled Meds: . atenolol  50 mg Oral Daily  . docusate sodium  100 mg Oral BID  . enoxaparin (LOVENOX) injection  30 mg Subcutaneous Q24H  . oxybutynin  5 mg Oral BID  . pantoprazole  40 mg Oral Daily  . simvastatin  40 mg Oral QHS   Continuous Infusions: . sodium chloride 75 mL/hr at 07/14/18 1130  . methocarbamol (ROBAXIN) IV       LOS: 2 days   Marylu Lund, MD [facility] Pager On Amion  If 7PM-7AM, please contact night-coverage 07/15/2018, 2:50 PM

## 2018-07-15 NOTE — NC FL2 (Signed)
[place] MEDICAID FL2 LEVEL OF CARE SCREENING TOOL     IDENTIFICATION  Patient Name: [name] [name] Birthdate: [birth year] Sex: female Admission Date (Current Location): 07/13/2018  [facility] and [place] Number:  Herbalist and Address:  [facility]. [facility] [place] [address], [place], [place] [address]      Provider Number: [id number]  Attending Physician Name and Address:  Donne Hazel, MD  Relative Name and Phone Number:  Royston Cowper, [phone]    Current Level of Care: Hospital Recommended Level of Care: [place] Prior Approval Number:    Date Approved/Denied:   PASRR Number:  Under Manual Review  Discharge Plan: SNF    Current Diagnoses: Patient Active Problem List   Diagnosis Date Noted  . Hip fracture ([place]) 07/13/2018  . Hypertension   . Hypercholesteremia   . Routine cervical smear 12/03/2017  . Screening for colorectal cancer 12/03/2017  . Mixed stress and urge urinary incontinence 12/03/2017  . GERD (gastroesophageal reflux disease) 04/09/2017  . Encounter for screening colonoscopy 11/22/2016  . Common bile duct dilatation 08/13/2013  . Nausea with vomiting 08/13/2013  . Bilateral leg weakness 06/24/2013  . Posture imbalance 06/24/2013  . Chronic radicular low back pain 06/24/2013    Orientation RESPIRATION BLADDER Height & Weight     Self, Time, Situation, Place  Normal Continent, External catheter Weight: 99 lb (44.9 kg) Height:  4\' 11"  (149.9 cm)  BEHAVIORAL SYMPTOMS/MOOD NEUROLOGICAL BOWEL NUTRITION STATUS      Continent Diet(regular diet, thin liquids)  AMBULATORY STATUS COMMUNICATION OF NEEDS Skin   Extensive Assist(2+ max assist) Verbally Surgical wounds(closed surgical incision on left leg, dressing in place, PRN dressing changes)                       Personal Care Assistance Level of Assistance  Bathing, Feeding, Dressing, Total care Bathing Assistance: Limited assistance Feeding  assistance: Independent Dressing Assistance: Limited assistance Total Care Assistance: Limited assistance   Functional Limitations Info  Sight, Hearing, Speech Sight Info: Adequate Hearing Info: Adequate Speech Info: Adequate    SPECIAL CARE FACTORS FREQUENCY  PT (By licensed PT), OT (By licensed OT)     PT Frequency: 5x/wk OT Frequency: 5x/wk            Contractures Contractures Info: Not present    Additional Factors Info  Code Status, Allergies Code Status Info: Full Code Allergies Info:  Morphine And Related, Other           Current Medications (07/15/2018):  This is the current hospital active medication list Current Facility-Administered Medications  Medication Dose Route Frequency Provider Last Rate Last Dose  . 0.9 %  sodium chloride infusion   Intravenous Continuous Prudencio Burly III, PA-C 75 mL/hr at 07/14/18 1130    . acetaminophen (TYLENOL) tablet 325-650 mg  325-650 mg Oral Q6H PRN Prudencio Burly III, PA-C      . atenolol (TENORMIN) tablet 50 mg  50 mg Oral Daily Donne Hazel, MD   50 mg at 07/15/18 1036  . bisacodyl (DULCOLAX) suppository 10 mg  10 mg Rectal Daily PRN Prudencio Burly III, PA-C      . docusate sodium (COLACE) capsule 100 mg  100 mg Oral BID Prudencio Burly III, PA-C   100 mg at 07/15/18 1036  . enoxaparin (LOVENOX) injection 30 mg  30 mg Subcutaneous Q24H Donne Hazel, MD   30 mg at 07/15/18 0755  .  HYDROcodone-acetaminophen (NORCO/VICODIN) 5-325 MG per tablet 1 tablet  1 tablet Oral Q4H PRN Prudencio Burly III, PA-C   1 tablet at 07/15/18 0755  . HYDROmorphone (DILAUDID) injection 0.25-0.5 mg  0.25-0.5 mg Intravenous Q4H PRN Prudencio Burly III, PA-C      . LORazepam (ATIVAN) tablet 1 mg  1 mg Oral Q6H PRN Donne Hazel, MD   1 mg at 07/14/18 2134  . methocarbamol (ROBAXIN) tablet 500 mg  500 mg Oral Q6H PRN Prudencio Burly III, PA-C   500 mg at 07/15/18 1037   Or  .  methocarbamol (ROBAXIN) 500 mg in dextrose 5 % 50 mL IVPB  500 mg Intravenous Q6H PRN Prudencio Burly III, PA-C      . metoCLOPramide (REGLAN) tablet 5-10 mg  5-10 mg Oral Q8H PRN Prudencio Burly III, PA-C       Or  . metoCLOPramide (REGLAN) injection 5-10 mg  5-10 mg Intravenous Q8H PRN Prudencio Burly III, PA-C      . ondansetron (ZOFRAN) tablet 4 mg  4 mg Oral Q6H PRN Prudencio Burly III, PA-C       Or  . ondansetron (ZOFRAN) injection 4 mg  4 mg Intravenous Q6H PRN Prudencio Burly III, PA-C      . oxybutynin (DITROPAN) tablet 5 mg  5 mg Oral BID Donne Hazel, MD   5 mg at 07/15/18 1036  . pantoprazole (PROTONIX) EC tablet 40 mg  40 mg Oral Daily Donne Hazel, MD   40 mg at 07/15/18 1036  . polyethylene glycol (MIRALAX / GLYCOLAX) packet 17 g  17 g Oral Daily PRN Prudencio Burly III, PA-C      . simvastatin (ZOCOR) tablet 40 mg  40 mg Oral QHS Donne Hazel, MD   40 mg at 07/14/18 2134  . sodium phosphate (FLEET) 7-19 GM/118ML enema 1 enema  1 enema Rectal Once PRN Prudencio Burly III, PA-C      . zolpidem (AMBIEN) tablet 5 mg  5 mg Oral QHS PRN Donne Hazel, MD         Discharge Medications: Please see discharge summary for a list of discharge medications.  Relevant Imaging Results:  Relevant Lab Results:   Additional Information SSN: [id number]  Philippa Chester Caelin Rayl, LCSWA

## 2018-07-15 NOTE — Progress Notes (Signed)
    Subjective: Patient reports pain as mild, controlled.  Tolerating diet.  Urinating.  No CP, SOB.  Not yet mobilized.  Objective:   VITALS:   Vitals:   07/14/18 1300 07/14/18 1952 07/15/18 0041 07/15/18 0409  BP: 132/82 139/79 (!) 149/77 128/72  Pulse: 69 76 66 61  Resp: 16     Temp: 98.9 F (37.2 C) 98.2 F (36.8 C) 98.5 F (36.9 C) 98.7 F (37.1 C)  TempSrc: Oral Oral Oral Oral  SpO2: 99% 99% 98% 97%  Weight:      Height:       CBC Latest Ref Rng & Units 07/14/2018 07/13/2018 05/26/2017  WBC 4.0 - 10.5 K/uL 13.7(H) 9.1 6.6  Hemoglobin 12.0 - 15.0 g/dL 11.0(L) 11.6(L) 12.0  Hematocrit 36.0 - 46.0 % 33.2(L) 35.6(L) 37.4  Platelets 150 - 400 K/uL 189 194 212   BMP Latest Ref Rng & Units 07/14/2018 07/13/2018 05/26/2017  Glucose 70 - 99 mg/dL - 105(H) 96  BUN 8 - 23 mg/dL - 29(H) 21(H)  Creatinine 0.44 - 1.00 mg/dL 1.11(H) 1.18(H) 1.21(H)  BUN/Creat Ratio 6 - 22 (calc) - - -  Sodium 135 - 145 mmol/L - 129(L) 138  Potassium 3.5 - 5.1 mmol/L - 5.4(H) 4.0  Chloride 98 - 111 mmol/L - 101 109  CO2 22 - 32 mmol/L - 21(L) 20(L)  Calcium 8.9 - 10.3 mg/dL - 7.8(L) 8.5(L)   Intake/Output      05/24 0701 - 05/25 0700 05/25 0701 - 05/26 0700   P.O. 360    I.V. (mL/kg) 1427.2 (31.8)    IV Piggyback 0    Total Intake(mL/kg) 1787.2 (39.8)    Urine (mL/kg/hr) 2700 (2.5)    Blood 100    Total Output 2800    Net -1012.8            Physical Exam: General: NAD.  Upright in bed.  Calm, talkative.   MSK LLE: Neurovascularly intact Sensation intact distally Feet warm Dorsiflexion/Plantar flexion intact Incision: dressing C/D/I   Assessment: 1 Day Post-Op  S/P Procedure(s) (LRB): INTRAMEDULLARY (IM) NAIL, LEFT HIP (Left) by Dr. Ernesta Amble. Percell Miller on 07/14/2018  Principal Problem:   Hip fracture ([place]) Active Problems:   GERD (gastroesophageal reflux disease)   Hypertension   Hypercholesteremia   Closed left intertrochanteric femur fracture, status post cephalo-medullary  IM nail Doing well postop day 1 Stable from an orthopedic perspective Pain controlled Not yet mobilized   Plan: Up with therapy Incentive Spirometry Elevate and Apply ice  Weightbearing: WBAT LLE Insicional and dressing care: Dressings left intact until follow-up Showering: Keep dressing dry VTE prophylaxis: Lovenox 40mg  qd, SCDs, ambulation Pain control: Tylenol.  Minimize narcotics.  Norco for breakthrough pain. Follow - up plan: 2 weeks in the office with Dr. Leamon Arnt information:  [name] Lynch MD, [name] Hockey PA-C  Dispo: TBD.  Therapy evaluations pending.  Discharge when ready medically and mobilized.   [name] Burly III, PA-C 07/15/2018, 7:46 AM

## 2018-07-15 NOTE — Progress Notes (Signed)
OT NOTE  OT evaluation completed at this time. OT formal evaluation to follow. Pt currently total +2 mod (A) and recommendation for SNF. Pt lives with son but with limited mobility at this time. Pt reports having DME at home from previous injuries.   Jeri Modena   OTR/L Pager: [phone] Office: [phone]

## 2018-07-15 NOTE — Evaluation (Signed)
Physical Therapy Evaluation Patient Details Name: [name] [name] MRN: [id number] DOB: Feb 27, 1940 Today's Date: 07/15/2018   History of Present Illness  Patient is a 78 y/o female with Comminuted intratrochanteric left femoral fracture s/p L IM nail on 07/14/18. Past medical history significant of hypertension, hyperlipidemia, anxiety/depression.     Clinical Impression  Patient admitted s/p above listed procedure. Patient reports Mod I with mobility prior to admission, where she lvied with her son. Patient today requiring Mod-Max A +2 for all mobility with patient limited due to L hip pain, weakness, balance deficits and overall reduced functional mobility. Will currently recommend SNF at discharge to progress safe functional mobility prior to return home. PT to follow acutely.      Follow Up Recommendations SNF    Equipment Recommendations  None recommended by PT    Recommendations for Other Services       Precautions / Restrictions Precautions Precautions: Fall Restrictions Weight Bearing Restrictions: Yes LLE Weight Bearing: Weight bearing as tolerated      Mobility  Bed Mobility Overal bed mobility: Needs Assistance Bed Mobility: Supine to Sit     Supine to sit: Max assist;+2 for physical assistance     General bed mobility comments: Max A +2 with use of bed pad to come to EOB; was able to guide R LE towards edge; Initially with Min/Mod sitting EOB with progression to supervision  Transfers Overall transfer level: Needs assistance Equipment used: Rolling walker (2 wheeled) Transfers: Sit to/from [facility] Sit to Stand: Mod assist;Max assist;+2 physical assistance Stand pivot transfers: Mod assist;Max assist;+2 physical assistance       General transfer comment: physical assist needed to power up into standing; difficulty with weight bearing due to pain; Cueing throughout for safety and sequencing during transfer  Ambulation/Gait             General Gait Details: unable   Stairs            Wheelchair Mobility    Modified Rankin (Stroke Patients Only)       Balance Overall balance assessment: Needs assistance Sitting-balance support: Bilateral upper extremity supported;Feet supported Sitting balance-Leahy Scale: Fair     Standing balance support: Bilateral upper extremity supported;During functional activity Standing balance-Leahy Scale: Poor Standing balance comment: reliant on UE and external support                             Pertinent Vitals/Pain Pain Assessment: Faces Faces Pain Scale: Hurts even more Pain Location: L hip Pain Descriptors / Indicators: Aching;Discomfort;Grimacing;Guarding Pain Intervention(s): Limited activity within patient's tolerance;Monitored during session;Repositioned    Home Living Family/patient expects to be discharged to:: Private residence Living Arrangements: Children(son) Available Help at Discharge: Family;Available 24 hours/day Type of Home: House Home Access: Stairs to enter Entrance Stairs-Rails: None Entrance Stairs-Number of Steps: 2 Home Layout: One level Home Equipment: Walker - 2 wheels;Cane - single point;Bedside commode;Shower seat Additional Comments: wears prescription glasses    Prior Function Level of Independence: Independent         Comments: use of AD due to knee pain; son drives     Hand Dominance        Extremity/Trunk Assessment   Upper Extremity Assessment Upper Extremity Assessment: Defer to OT evaluation    Lower Extremity Assessment Lower Extremity Assessment: Generalized weakness;LLE deficits/detail LLE Deficits / Details: expected post-op pain and weakness; limited weight bearing with difficulty with knee/hip extension  Communication   Communication: No difficulties  Cognition Arousal/Alertness: Awake/alert Behavior During Therapy: WFL for tasks assessed/performed Overall Cognitive Status: Within  Functional Limits for tasks assessed                                        General Comments      Exercises     Assessment/Plan    PT Assessment Patient needs continued PT services  PT Problem List Decreased strength;Decreased activity tolerance;Decreased balance;Decreased mobility;Decreased knowledge of use of DME;Decreased knowledge of precautions;Decreased safety awareness       PT Treatment Interventions DME instruction;Gait training;Stair training;Functional mobility training;Therapeutic activities;Therapeutic exercise;Balance training;Patient/family education    PT Goals (Current goals can be found in the Care Plan section)  Acute Rehab PT Goals Patient Stated Goal: regain mobility PT Goal Formulation: With patient Time For Goal Achievement: 07/29/18 Potential to Achieve Goals: Good    Frequency Min 4X/week   Barriers to discharge        Co-evaluation PT/OT/SLP Co-Evaluation/Treatment: Yes Reason for Co-Treatment: For patient/therapist safety;To address functional/ADL transfers PT goals addressed during session: Mobility/safety with mobility;Balance;Proper use of DME;Strengthening/ROM         AM-PAC PT "6 Clicks" Mobility  Outcome Measure Help needed turning from your back to your side while in a flat bed without using bedrails?: A Lot Help needed moving from lying on your back to sitting on the side of a flat bed without using bedrails?: A Lot Help needed moving to and from a bed to a chair (including a wheelchair)?: A Lot Help needed standing up from a chair using your arms (e.g., wheelchair or bedside chair)?: A Lot Help needed to walk in hospital room?: Total Help needed climbing 3-5 steps with a railing? : Total 6 Click Score: 10    End of Session Equipment Utilized During Treatment: Gait belt Activity Tolerance: Patient tolerated treatment well Patient left: in chair;with call bell/phone within reach;with chair alarm set Nurse  Communication: Mobility status PT Visit Diagnosis: Unsteadiness on feet (R26.81);Other abnormalities of gait and mobility (R26.89);Muscle weakness (generalized) (M62.81);History of falling (Z91.81)    Time: 8144-8185 PT Time Calculation (min) (ACUTE ONLY): 34 min   Charges:   PT Evaluation $PT Eval Moderate Complexity: 1 Mod          Lanney Gins, PT, DPT Supplemental Physical Therapist 07/15/18 11:10 AM Pager: [phone] Office: [phone]

## 2018-07-16 ENCOUNTER — Encounter (HOSPITAL_COMMUNITY): Payer: Self-pay | Admitting: Orthopedic Surgery

## 2018-07-16 DIAGNOSIS — E7800 Pure hypercholesterolemia, unspecified: Secondary | ICD-10-CM

## 2018-07-16 LAB — CBC
HCT: 26.6 % — ABNORMAL LOW (ref 36.0–46.0)
Hemoglobin: 8.7 g/dL — ABNORMAL LOW (ref 12.0–15.0)
MCH: 32.7 pg (ref 26.0–34.0)
MCHC: 32.7 g/dL (ref 30.0–36.0)
MCV: 100 fL (ref 80.0–100.0)
Platelets: 147 10*3/uL — ABNORMAL LOW (ref 150–400)
RBC: 2.66 MIL/uL — ABNORMAL LOW (ref 3.87–5.11)
RDW: 12.7 % (ref 11.5–15.5)
WBC: 11.8 10*3/uL — ABNORMAL HIGH (ref 4.0–10.5)
nRBC: 0 % (ref 0.0–0.2)

## 2018-07-16 LAB — BASIC METABOLIC PANEL
Anion gap: 6 (ref 5–15)
BUN: 20 mg/dL (ref 8–23)
CO2: 22 mmol/L (ref 22–32)
Calcium: 7.5 mg/dL — ABNORMAL LOW (ref 8.9–10.3)
Chloride: 108 mmol/L (ref 98–111)
Creatinine, Ser: 0.88 mg/dL (ref 0.44–1.00)
GFR calc Af Amer: >60 mL/min (ref >60)
GFR calc non Af Amer: >60 mL/min (ref >60)
Glucose, Bld: 118 mg/dL — ABNORMAL HIGH (ref 70–99)
Potassium: 4.3 mmol/L (ref 3.5–5.1)
Sodium: 136 mmol/L (ref 135–145)

## 2018-07-16 LAB — TYPE AND SCREEN
ABO/RH(D): O POS
Antibody Screen: POSITIVE

## 2018-07-16 MED ORDER — SODIUM CHLORIDE 0.9 % IV BOLUS
250.0000 mL | Freq: Once | INTRAVENOUS | Status: AC
  Administered 2018-07-16: 14:00:00 250 mL via INTRAVENOUS

## 2018-07-16 NOTE — Progress Notes (Signed)
Orthopedic Tech Progress Note Patient Details:  [name] [name] 09-Jul-1940 [id number]  Ortho Devices Ortho Device/Splint Location: Trapeze bar       [place] Pink 07/16/2018, 2:30 PM

## 2018-07-16 NOTE — Progress Notes (Addendum)
PROGRESS NOTE    SVEA PUSCH  MWN:[id number] DOB: [birth year] DOA: 07/13/2018 PCP: Sinda Du, MD    Brief Narrative:  78 y.o. female with medical history significant of hypertension, hyperlipidemia was shopping today for groceries when she slipped in the aisle and fell injuring her left leg.  She did not trip or fall anything she states she just lost her footing and fell.  She did hit her head but did not have loss of consciousness.  She denies any previous illnesses no previous respiratory illnesses or shortness of breath cough nausea vomiting or diarrhea.  Patient found to have a left femur fracture referred for admission for such.  Orthopedic surgery has been consulted planning on taking her to the OR at [facility] tomorrow.  Therefore patient be transferred to [facility] from [place] [facility] tonight.  Assessment & Plan:   Principal Problem:   Hip fracture ([place]) Active Problems:   GERD (gastroesophageal reflux disease)   Hypertension   Hypercholesteremia    Hip fracture ([facility]) -Left femur fracture -Orthopedic Surgery following and pt now s/p surgery 5/24 -Continue analgesic as needed -PT/OT consulted with recommendations for SNF. SW continues to follow    GERD (gastroesophageal reflux disease) -Will resume PPI  -Presently stable    Hypertension -BP suboptimally controlled overnight -orthostatic vitals reviewed. BP drop from 141/88 to 91/69 from sitting to standing -Will give gentle bolus followed by continued IVF as tolerated    Hypercholesteremia -will continue statin per home regimen -Stable at present  Anxiety -Pt noted to have 1mg  ativan 4 time daily scheduled on home med rec -cont on PRN ativan while in hospital. Stable  Acute renal failure -Given IVF as per above -Cr improved from 1.18 to 0.88 today -Repeat BMET in AM  DVT prophylaxis: Lovenox subQ Code Status: Full Family Communication: Pt in room, family not at bedside Disposition Plan: Uncertain at  this time, pending PT eval  Consultants:   Orthopedic Surgery  Procedures:   L hip intramedullary nail 5/24  Antimicrobials: Anti-infectives (From admission, onward)   Start     Dose/Rate Route Frequency Ordered Stop   07/14/18 1030  ceFAZolin (ANCEF) IVPB 2g/100 mL premix     2 g 200 mL/hr over 30 Minutes Intravenous Every 6 hours 07/14/18 1021 07/14/18 1818   07/14/18 0733  ceFAZolin (ANCEF) 2-4 GM/100ML-% IVPB    Note to Pharmacy:  Derinda Sis   : cabinet override      07/14/18 0733 07/14/18 1944      Subjective: Reports mild dizziness when working with PT  Objective: Vitals:   07/15/18 0409 07/15/18 1420 07/15/18 1943 07/16/18 0404  BP: 128/72 118/65 128/68 (!) 108/94  Pulse: 61 61 63 69  Resp:  16 14 14   Temp: 98.7 F (37.1 C) 98.3 F (36.8 C) 98.5 F (36.9 C) 98.3 F (36.8 C)  TempSrc: Oral Oral Oral Oral  SpO2: 97% 98% 97% 97%  Weight:      Height:        Intake/Output Summary (Last 24 hours) at 07/16/2018 1342 Last data filed at 07/16/2018 1115 Gross per 24 hour  Intake 845 ml  Output 3800 ml  Net -2955 ml   Filed Weights   07/13/18 2139 07/14/18 0400  Weight: 44.9 kg 44.9 kg    Examination: General exam: Conversant, in no acute distress Respiratory system: normal chest rise, clear, no audible wheezing Cardiovascular system: regular rhythm, s1-s2 Gastrointestinal system: Nondistended, nontender, pos BS Central nervous system: No seizures, no tremors  Extremities: No cyanosis, no joint deformities Skin: No rashes, no pallor Psychiatry: Affect normal // no auditory hallucinations   Data Reviewed: I have personally reviewed following labs and imaging studies  CBC: Recent Labs  Lab 07/13/18 2208 07/14/18 1126 07/15/18 0959 07/16/18 0237  WBC 9.1 13.7* 13.7* 11.8*  NEUTROABS 6.3  --   --   --   HGB 11.6* 11.0* 9.4* 8.7*  HCT 35.6* 33.2* 28.7* 26.6*  MCV 100.8* 97.6 99.0 100.0  PLT 194 189 181 412*   Basic Metabolic Panel: Recent  Labs  Lab 07/13/18 2208 07/14/18 1126 07/15/18 0959 07/16/18 0237  NA 129*  --  132* 136  K 5.4*  --  4.6 4.3  CL 101  --  102 108  CO2 21*  --  21* 22  GLUCOSE 105*  --  150* 118*  BUN 29*  --  24* 20  CREATININE 1.18* 1.11* 1.06* 0.88  CALCIUM 7.8*  --  7.6* 7.5*   GFR: Estimated Creatinine Clearance: 35.9 mL/min (by C-G formula based on SCr of 0.88 mg/dL). Liver Function Tests: No results for input(s): AST, ALT, ALKPHOS, BILITOT, PROT, ALBUMIN in the last 168 hours. No results for input(s): LIPASE, AMYLASE in the last 168 hours. No results for input(s): AMMONIA in the last 168 hours. Coagulation Profile: No results for input(s): INR, PROTIME in the last 168 hours. Cardiac Enzymes: No results for input(s): CKTOTAL, CKMB, CKMBINDEX, TROPONINI in the last 168 hours. BNP (last 3 results) No results for input(s): PROBNP in the last 8760 hours. HbA1C: No results for input(s): HGBA1C in the last 72 hours. CBG: No results for input(s): GLUCAP in the last 168 hours. Lipid Profile: No results for input(s): CHOL, HDL, LDLCALC, TRIG, CHOLHDL, LDLDIRECT in the last 72 hours. Thyroid Function Tests: No results for input(s): TSH, T4TOTAL, FREET4, T3FREE, THYROIDAB in the last 72 hours. Anemia Panel: No results for input(s): VITAMINB12, FOLATE, FERRITIN, TIBC, IRON, RETICCTPCT in the last 72 hours. Sepsis Labs: No results for input(s): PROCALCITON, LATICACIDVEN in the last 168 hours.  Recent Results (from the past 240 hour(s))  SARS Coronavirus 2 (CEPHEID - Performed in [place] hospital lab), Hosp Order     Status: None   Collection Time: 07/14/18 12:31 AM  Result Value Ref Range Status   SARS Coronavirus 2 NEGATIVE NEGATIVE Final    Comment: (NOTE) If result is NEGATIVE SARS-CoV-2 target nucleic acids are NOT DETECTED. The SARS-CoV-2 RNA is generally detectable in upper and lower  respiratory specimens during the acute phase of infection. The lowest  concentration of  SARS-CoV-2 viral copies this assay can detect is 250  copies / mL. A negative result does not preclude SARS-CoV-2 infection  and should not be used as the sole basis for treatment or other  patient management decisions.  A negative result may occur with  improper specimen collection / handling, submission of specimen other  than nasopharyngeal swab, presence of viral mutation(s) within the  areas targeted by this assay, and inadequate number of viral copies  (<250 copies / mL). A negative result must be combined with clinical  observations, patient history, and epidemiological information. If result is POSITIVE SARS-CoV-2 target nucleic acids are DETECTED. The SARS-CoV-2 RNA is generally detectable in upper and lower  respiratory specimens dur ing the acute phase of infection.  Positive  results are indicative of active infection with SARS-CoV-2.  Clinical  correlation with patient history and other diagnostic information is  necessary to determine patient infection status.  Positive results do  not rule out bacterial infection or co-infection with other viruses. If result is PRESUMPTIVE POSTIVE SARS-CoV-2 nucleic acids MAY BE PRESENT.   A presumptive positive result was obtained on the submitted specimen  and confirmed on repeat testing.  While 2019 novel coronavirus  (SARS-CoV-2) nucleic acids may be present in the submitted sample  additional confirmatory testing may be necessary for epidemiological  and / or clinical management purposes  to differentiate between  SARS-CoV-2 and other Sarbecovirus currently known to infect humans.  If clinically indicated additional testing with an alternate test  methodology [id number]) is advised. The SARS-CoV-2 RNA is generally  detectable in upper and lower respiratory sp ecimens during the acute  phase of infection. The expected result is Negative. Fact Sheet for Patients:  StrictlyIdeas.no Fact Sheet for Healthcare  Providers: BankingDealers.co.za This test is not yet approved or cleared by the [place] FDA and has been authorized for detection and/or diagnosis of SARS-CoV-2 by FDA under an Emergency Use Authorization (EUA).  This EUA will remain in effect (meaning this test can be used) for the duration of the COVID-19 declaration under Section 564(b)(1) of the Act, 21 U.S.C. section 360bbb-3(b)(1), unless the authorization is terminated or revoked sooner. Performed at [facility], [place] [address], [place], [place] [address]   Surgical PCR screen     Status: None   Collection Time: 07/14/18  4:45 AM  Result Value Ref Range Status   MRSA, PCR NEGATIVE NEGATIVE Final   Staphylococcus aureus NEGATIVE NEGATIVE Final    Comment: (NOTE) The Xpert SA Assay (FDA approved for NASAL specimens in patients 17 years of age and older), is one component of a comprehensive surveillance program. It is not intended to diagnose infection nor to guide or monitor treatment. Performed at [facility], [place] [address] [place], [place] [address]      Radiology Studies: No results found.  Scheduled Meds: . atenolol  50 mg Oral Daily  . docusate sodium  100 mg Oral BID  . enoxaparin (LOVENOX) injection  30 mg Subcutaneous Q24H  . oxybutynin  5 mg Oral BID  . pantoprazole  40 mg Oral Daily  . simvastatin  40 mg Oral QHS   Continuous Infusions: . sodium chloride 75 mL/hr at 07/14/18 1130  . methocarbamol (ROBAXIN) IV       LOS: 3 days   Marylu Lund, MD [facility] Pager On Amion  If 7PM-7AM, please contact night-coverage 07/16/2018, 1:42 PM

## 2018-07-16 NOTE — TOC Initial Note (Signed)
Transition of Care [facility]) - Initial/Assessment Note    Patient Details  Name: [name] [name] MRN: [id number] Date of Birth: [birth year]  [facility]) CM/SW Contact:    Candie Chroman, LCSW Phone Number: 07/16/2018, 12:44 PM  Clinical Narrative:  CSW met with patient, introduced role, and explained that PT recommendations would be discussed. Patient agreeable to SNF in [place]. Provided CMS scores for facilities within 50 miles of her zip code. Sent referral to [facility] and [facility]. No further concerns. CSW encouraged patient to contact CSW as needed. CSW will continue to follow patient for support and facilitate discharge to SNF once medically stable.           Expected Discharge Plan: Skilled Nursing Facility Barriers to Discharge: Continued Medical Work up   Patient Goals and CMS Choice Patient states their goals for this hospitalization and ongoing recovery are:: To get back to where people don't have to do things for her. CMS Medicare.gov Compare Post Acute Care list provided to:: Patient    Expected Discharge Plan and Services Expected Discharge Plan: [place] Choice: [place] arrangements for the past 2 months: Single Family Home                                      Prior Living Arrangements/Services Living arrangements for the past 2 months: Single Family Home Lives with:: Adult Children Patient language and need for interpreter reviewed:: No Do you feel safe going back to the place where you live?: Yes      Need for Family Participation in Patient Care: Yes (Comment) Care giver support system in place?: Yes (comment)(SNF staff. Lives with son.)   Criminal Activity/Legal Involvement Pertinent to Current Situation/Hospitalization: No - Comment as needed  Activities of Daily Living Home Assistive Devices/Equipment: None ADL Screening (condition at time of  admission) Patient's cognitive ability adequate to safely complete daily activities?: Yes Is the patient deaf or have difficulty hearing?: No Does the patient have difficulty seeing, even when wearing glasses/contacts?: No Does the patient have difficulty concentrating, remembering, or making decisions?: No Patient able to express need for assistance with ADLs?: Yes Does the patient have difficulty dressing or bathing?: No Independently performs ADLs?: Yes (appropriate for developmental age) Does the patient have difficulty walking or climbing stairs?: No Weakness of Legs: Left Weakness of Arms/Hands: None  Permission Sought/Granted Permission sought to share information with : Facility Art therapist granted to share information with : Yes, Verbal Permission Granted     Permission granted to share info w AGENCY: SNF's        Emotional Assessment Appearance:: Appears stated age Attitude/Demeanor/Rapport: Engaged, Gracious Affect (typically observed): Accepting, Appropriate, Calm, Pleasant Orientation: : Oriented to Self, Oriented to Place, Oriented to  Time, Oriented to Situation Alcohol / Substance Use: Never Used Psych Involvement: No (comment)  Admission diagnosis:  Closed nondisplaced intertrochanteric fracture of left femur, initial encounter [facility]) [S72.145A] Patient Active Problem List   Diagnosis Date Noted  . Hip fracture ([place]) 07/13/2018  . Hypertension   . Hypercholesteremia   . Routine cervical smear 12/03/2017  . Screening for colorectal cancer 12/03/2017  . Mixed stress and urge urinary incontinence 12/03/2017  . GERD (gastroesophageal reflux disease) 04/09/2017  . Encounter for screening colonoscopy 11/22/2016  . Common bile duct dilatation 08/13/2013  . Nausea  with vomiting 08/13/2013  . Bilateral leg weakness 06/24/2013  . Posture imbalance 06/24/2013  . Chronic radicular low back pain 06/24/2013   PCP:  Sinda Du, MD Pharmacy:    [facility] [address] [place] [address] Phone: [phone] Fax: [phone]     Social Determinants of Health (SDOH) Interventions    Readmission Risk Interventions No flowsheet data found.

## 2018-07-16 NOTE — TOC Progression Note (Signed)
Transition of Care [facility]) - Progression Note    Patient Details  Name: [name] [name] MRN: [id number] Date of Birth: January 17, 1941  Transition of Care [facility]) CM/SW [place], [place] Phone Number: 07/16/2018, 3:22 PM  Clinical Narrative:    Pt has chosen [facility], at her request I confirmed this with her son Rush Landmark via telephone, [phone]. Message left for admissions coordinator.   Expected Discharge Plan: [place] Barriers to Discharge: Continued Medical Work up  Expected Discharge Plan and Services Expected Discharge Plan: [place] Choice: [place] arrangements for the past 2 months: Single Family Home                     Social Determinants of Health (SDOH) Interventions    Readmission Risk Interventions No flowsheet data found.

## 2018-07-16 NOTE — [place] FL2 (Signed)
[place] MEDICAID FL2 LEVEL OF CARE SCREENING TOOL     IDENTIFICATION  Patient Name: [name] [name] Birthdate: 10-12-1940 Sex: female Admission Date (Current Location): 07/13/2018  [facility] and [place] Number:  Herbalist and Address:  [facility] [place], [place] [address] [place], [place] [address]      Provider Number: [id number]  Attending Physician Name and Address:  Donne Hazel, MD  Relative Name and Phone Number:  Royston Cowper, [phone]    Current Level of Care: Hospital Recommended Level of Care: [place] Prior Approval Number:    Date Approved/Denied:   PASRR Number:   [id number] E. Expires 6/25  Discharge Plan: SNF    Current Diagnoses: Patient Active Problem List   Diagnosis Date Noted  . Hip fracture ([place]) 07/13/2018  . Hypertension   . Hypercholesteremia   . Routine cervical smear 12/03/2017  . Screening for colorectal cancer 12/03/2017  . Mixed stress and urge urinary incontinence 12/03/2017  . GERD (gastroesophageal reflux disease) 04/09/2017  . Encounter for screening colonoscopy 11/22/2016  . Common bile duct dilatation 08/13/2013  . Nausea with vomiting 08/13/2013  . Bilateral leg weakness 06/24/2013  . Posture imbalance 06/24/2013  . Chronic radicular low back pain 06/24/2013    Orientation RESPIRATION BLADDER Height & Weight     Self, Time, Situation, Place  Normal Continent, External catheter Weight: 99 lb (44.9 kg) Height:  4\' 11"  (149.9 cm)  BEHAVIORAL SYMPTOMS/MOOD NEUROLOGICAL BOWEL NUTRITION STATUS      Continent Diet(regular diet, thin liquids)  AMBULATORY STATUS COMMUNICATION OF NEEDS Skin   Extensive Assist(2+ max assist) Verbally Surgical wounds(closed surgical incision on left leg, dressing in place, PRN dressing changes)                       Personal Care Assistance Level of Assistance  Bathing, Feeding, Dressing, Total care Bathing Assistance: Limited  assistance Feeding assistance: Independent Dressing Assistance: Limited assistance Total Care Assistance: Limited assistance   Functional Limitations Info  Sight, Hearing, Speech Sight Info: Adequate Hearing Info: Adequate Speech Info: Adequate    SPECIAL CARE FACTORS FREQUENCY  PT (By licensed PT), OT (By licensed OT)     PT Frequency: 5x/wk OT Frequency: 5x/wk            Contractures Contractures Info: Not present    Additional Factors Info  Code Status, Allergies Code Status Info: Full Code Allergies Info:  Morphine And Related, Other           Current Medications (07/16/2018):  This is the current hospital active medication list Current Facility-Administered Medications  Medication Dose Route Frequency Provider Last Rate Last Dose  . 0.9 %  sodium chloride infusion   Intravenous Continuous Donne Hazel, MD 75 mL/hr at 07/14/18 1130    . acetaminophen (TYLENOL) tablet 325-650 mg  325-650 mg Oral Q6H PRN Prudencio Burly III, PA-C      . atenolol (TENORMIN) tablet 50 mg  50 mg Oral Daily Donne Hazel, MD   50 mg at 07/16/18 1018  . bisacodyl (DULCOLAX) suppository 10 mg  10 mg Rectal Daily PRN Prudencio Burly III, PA-C      . docusate sodium (COLACE) capsule 100 mg  100 mg Oral BID Prudencio Burly III, PA-C   100 mg at 07/16/18 1018  . enoxaparin (LOVENOX) injection 30 mg  30 mg Subcutaneous Q24H Donne Hazel, MD   30 mg at 07/16/18 0804  .  HYDROcodone-acetaminophen (NORCO/VICODIN) 5-325 MG per tablet 1 tablet  1 tablet Oral Q4H PRN Prudencio Burly III, PA-C   1 tablet at 07/15/18 1723  . HYDROmorphone (DILAUDID) injection 0.25-0.5 mg  0.25-0.5 mg Intravenous Q4H PRN Prudencio Burly III, PA-C      . LORazepam (ATIVAN) tablet 1 mg  1 mg Oral Q6H PRN Donne Hazel, MD   1 mg at 07/14/18 2134  . methocarbamol (ROBAXIN) tablet 500 mg  500 mg Oral Q6H PRN Prudencio Burly III, PA-C   500 mg at 07/15/18 1037   Or  .  methocarbamol (ROBAXIN) 500 mg in dextrose 5 % 50 mL IVPB  500 mg Intravenous Q6H PRN Prudencio Burly III, PA-C      . metoCLOPramide (REGLAN) tablet 5-10 mg  5-10 mg Oral Q8H PRN Prudencio Burly III, PA-C       Or  . metoCLOPramide (REGLAN) injection 5-10 mg  5-10 mg Intravenous Q8H PRN Prudencio Burly III, PA-C      . ondansetron (ZOFRAN) tablet 4 mg  4 mg Oral Q6H PRN Prudencio Burly III, PA-C       Or  . ondansetron (ZOFRAN) injection 4 mg  4 mg Intravenous Q6H PRN Prudencio Burly III, PA-C      . oxybutynin (DITROPAN) tablet 5 mg  5 mg Oral BID Donne Hazel, MD   5 mg at 07/16/18 1018  . pantoprazole (PROTONIX) EC tablet 40 mg  40 mg Oral Daily Donne Hazel, MD   40 mg at 07/16/18 1018  . polyethylene glycol (MIRALAX / GLYCOLAX) packet 17 g  17 g Oral Daily PRN Prudencio Burly III, PA-C      . simvastatin (ZOCOR) tablet 40 mg  40 mg Oral QHS Donne Hazel, MD   40 mg at 07/15/18 2114  . sodium phosphate (FLEET) 7-19 GM/118ML enema 1 enema  1 enema Rectal Once PRN Prudencio Burly III, PA-C      . zolpidem (AMBIEN) tablet 5 mg  5 mg Oral QHS PRN Donne Hazel, MD         Discharge Medications: Please see discharge summary for a list of discharge medications.  Relevant Imaging Results:  Relevant Lab Results:   Additional Information SSN: [id number]  [place], [place]

## 2018-07-16 NOTE — Progress Notes (Addendum)
    Subjective: Patient reports pain as moderate, controlled.  Tolerating diet.  Urinating.  No CP, SOB.  Lightheaded when up with therapy yesterday.  Objective:   VITALS:   Vitals:   07/15/18 0409 07/15/18 1420 07/15/18 1943 07/16/18 0404  BP: 128/72 118/65 128/68 (!) 108/94  Pulse: 61 61 63 69  Resp:  16 14 14   Temp: 98.7 F (37.1 C) 98.3 F (36.8 C) 98.5 F (36.9 C) 98.3 F (36.8 C)  TempSrc: Oral Oral Oral Oral  SpO2: 97% 98% 97% 97%  Weight:      Height:       CBC Latest Ref Rng & Units 07/16/2018 07/15/2018 07/14/2018  WBC 4.0 - 10.5 K/uL 11.8(H) 13.7(H) 13.7(H)  Hemoglobin 12.0 - 15.0 g/dL 8.7(L) 9.4(L) 11.0(L)  Hematocrit 36.0 - 46.0 % 26.6(L) 28.7(L) 33.2(L)  Platelets 150 - 400 K/uL 147(L) 181 189   BMP Latest Ref Rng & Units 07/16/2018 07/15/2018 07/14/2018  Glucose 70 - 99 mg/dL 118(H) 150(H) -  BUN 8 - 23 mg/dL 20 24(H) -  Creatinine 0.44 - 1.00 mg/dL 0.88 1.06(H) 1.11(H)  BUN/Creat Ratio 6 - 22 (calc) - - -  Sodium 135 - 145 mmol/L 136 132(L) -  Potassium 3.5 - 5.1 mmol/L 4.3 4.6 -  Chloride 98 - 111 mmol/L 108 102 -  CO2 22 - 32 mmol/L 22 21(L) -  Calcium 8.9 - 10.3 mg/dL 7.5(L) 7.6(L) -   Intake/Output      05/25 0701 - 05/26 0700   P.O. 785   Total Intake(mL/kg) 785 (17.5)   Urine (mL/kg/hr) 3000 (2.8)   Stool 0   Total Output 3000   Net -2215       Urine Occurrence 2 x   Stool Occurrence 1 x      Physical Exam: General: NAD.  Upright in bed.  Calm, talkative.   MSK LLE: Neurovascularly intact Sensation intact distally Feet warm Dorsiflexion/Plantar flexion intact Incision: dressing C/D/I   Assessment: 2 Days Post-Op  S/P Procedure(s) (LRB): INTRAMEDULLARY (IM) NAIL, LEFT HIP (Left) by Dr. Ernesta Amble. Percell Miller on 07/14/2018  Principal Problem:   Hip fracture ([place]) Active Problems:   GERD (gastroesophageal reflux disease)   Hypertension   Hypercholesteremia   Closed left intertrochanteric femur fracture, status post  cephalo-medullary IM nail Doing well postop day 2 Stable from an orthopedic perspective Pain controlled Not yet mobilized  ABLA: Hgb 8.7<9.4.  Some lightheadedness yesterday when up with therapy.  No sign of active bleeding.  Likely with some dilutional component.  Plan: Up with therapy Incentive Spirometry Apply ice PRN  Weightbearing: WBAT LLE Insicional and dressing care: Dressings left intact until follow-up Showering: Keep dressing dry VTE prophylaxis: Lovenox 40mg  qd, SCDs, ambulation Pain control: Tylenol.  Minimize narcotics.  Norco for breakthrough pain. Follow - up plan: 2 weeks in the office with Dr. Leamon Arnt information:  [name] MD, Roxan Hockey PA-C  Dispo: SNF recommended by therapy.  Plan for discharge per medicine when ready medically and placement made.   Prudencio Burly III, PA-C 07/16/2018, 6:40 AM

## 2018-07-16 NOTE — Progress Notes (Signed)
Physical Therapy Treatment Patient Details Name: [name] [name] MRN: [id number] DOB: 11/18/1940 Today's Date: 07/16/2018    History of Present Illness Patient is a 78 y/o female with Comminuted intratrochanteric left femoral fracture s/p L IM nail on 07/14/18. Past medical history significant of hypertension, hyperlipidemia, anxiety/depression.     PT Comments    Pt motivated to work with therapy but very limited by pain with LLE wt bearing which is preventing her from being able to step RLE to ambulate. Worked on sit<>stand and wt shifting to work towards ambulation. Mod A +2 needed for sit <> stand. Pt not tolerating full L knee extension. Worked on L quad sets in sitting and standing. PT will continue to follow.    Follow Up Recommendations  SNF     Equipment Recommendations  None recommended by PT    Recommendations for Other Services       Precautions / Restrictions Precautions Precautions: Fall Restrictions Weight Bearing Restrictions: Yes LLE Weight Bearing: Weight bearing as tolerated    Mobility  Bed Mobility               General bed mobility comments: pt received in chair  Transfers Overall transfer level: Needs assistance Equipment used: Rolling walker (2 wheeled) Transfers: Sit to/from Stand Sit to Stand: Mod assist;+2 physical assistance         General transfer comment: vc's for positioning and hand placement, mod A +2 for power up and fwd wt shift. Performed multiple times for improved initiation and strengthening  Ambulation/Gait             General Gait Details: pt unable to step R foot sufficiently to ambulate   Stairs             Wheelchair Mobility    Modified Rankin (Stroke Patients Only)       Balance Overall balance assessment: Needs assistance Sitting-balance support: Bilateral upper extremity supported;Feet supported Sitting balance-Leahy Scale: Fair     Standing balance support: Bilateral upper extremity  supported;During functional activity Standing balance-Leahy Scale: Poor Standing balance comment: reliant on UE and external support. Worked on wt shifting in standing and well as picking up each foot for pregait.                             Cognition Arousal/Alertness: Awake/alert Behavior During Therapy: WFL for tasks assessed/performed Overall Cognitive Status: Within Functional Limits for tasks assessed                                 General Comments: very verbal and has had traumatic life      Exercises General Exercises - Lower Extremity Ankle Circles/Pumps: AROM;Both;10 reps;Seated Quad Sets: AROM;Left;10 reps;Seated Long Arc Quad: AAROM;Left;10 reps;Seated    General Comments        Pertinent Vitals/Pain Pain Assessment: 0-10 Pain Score: 8  Pain Location: L hip Pain Descriptors / Indicators: Aching;Discomfort;Grimacing;Guarding Pain Intervention(s): Limited activity within patient's tolerance;Monitored during session    Home Living                      Prior Function            PT Goals (current goals can now be found in the care plan section) Acute Rehab PT Goals Patient Stated Goal: to not have pain PT Goal Formulation: With patient Time For Goal  Achievement: 07/29/18 Potential to Achieve Goals: Good Progress towards PT goals: Progressing toward goals    Frequency    Min 4X/week      PT Plan Current plan remains appropriate    Co-evaluation              AM-PAC PT "6 Clicks" Mobility   Outcome Measure  Help needed turning from your back to your side while in a flat bed without using bedrails?: A Lot Help needed moving from lying on your back to sitting on the side of a flat bed without using bedrails?: A Lot Help needed moving to and from a bed to a chair (including a wheelchair)?: A Lot Help needed standing up from a chair using your arms (e.g., wheelchair or bedside chair)?: A Lot Help needed to walk  in hospital room?: Total Help needed climbing 3-5 steps with a railing? : Total 6 Click Score: 10    End of Session Equipment Utilized During Treatment: Gait belt Activity Tolerance: Patient tolerated treatment well Patient left: in chair;with call bell/phone within reach;with chair alarm set Nurse Communication: Mobility status PT Visit Diagnosis: Unsteadiness on feet (R26.81);Other abnormalities of gait and mobility (R26.89);Muscle weakness (generalized) (M62.81);History of falling (Z91.81)     Time: 1130-1153 PT Time Calculation (min) (ACUTE ONLY): 23 min  Charges:  $Therapeutic Exercise: 8-22 mins $Therapeutic Activity: 8-22 mins                     Leighton Roach, PT  Acute Rehab Services  Pager [phone] Office [place] 07/16/2018, 1:49 PM

## 2018-07-17 ENCOUNTER — Non-Acute Institutional Stay (SKILLED_NURSING_FACILITY): Payer: Medicare Other | Admitting: Adult Health

## 2018-07-17 ENCOUNTER — Other Ambulatory Visit: Payer: Self-pay | Admitting: Adult Health

## 2018-07-17 ENCOUNTER — Inpatient Hospital Stay
Admission: RE | Admit: 2018-07-17 | Discharge: 2018-08-02 | Disposition: A | Discharge status: Home / Self Care | Payer: Medicare Other | Source: Ambulatory Visit | Attending: Internal Medicine | Admitting: Internal Medicine

## 2018-07-17 ENCOUNTER — Encounter: Payer: Self-pay | Admitting: Adult Health

## 2018-07-17 DIAGNOSIS — R262 Difficulty in walking, not elsewhere classified: Secondary | ICD-10-CM | POA: Diagnosis not present

## 2018-07-17 DIAGNOSIS — D62 Acute posthemorrhagic anemia: Secondary | ICD-10-CM

## 2018-07-17 DIAGNOSIS — M6281 Muscle weakness (generalized): Secondary | ICD-10-CM | POA: Diagnosis not present

## 2018-07-17 DIAGNOSIS — E785 Hyperlipidemia, unspecified: Secondary | ICD-10-CM

## 2018-07-17 DIAGNOSIS — Z7401 Bed confinement status: Secondary | ICD-10-CM | POA: Diagnosis not present

## 2018-07-17 DIAGNOSIS — S72142S Displaced intertrochanteric fracture of left femur, sequela: Secondary | ICD-10-CM | POA: Diagnosis not present

## 2018-07-17 DIAGNOSIS — M2550 Pain in unspecified joint: Secondary | ICD-10-CM | POA: Diagnosis not present

## 2018-07-17 DIAGNOSIS — M5416 Radiculopathy, lumbar region: Secondary | ICD-10-CM | POA: Diagnosis not present

## 2018-07-17 DIAGNOSIS — M8000XS Age-related osteoporosis with current pathological fracture, unspecified site, sequela: Secondary | ICD-10-CM | POA: Diagnosis not present

## 2018-07-17 DIAGNOSIS — G8929 Other chronic pain: Secondary | ICD-10-CM

## 2018-07-17 DIAGNOSIS — K219 Gastro-esophageal reflux disease without esophagitis: Secondary | ICD-10-CM

## 2018-07-17 DIAGNOSIS — Z9181 History of falling: Secondary | ICD-10-CM | POA: Diagnosis not present

## 2018-07-17 DIAGNOSIS — Z741 Need for assistance with personal care: Secondary | ICD-10-CM | POA: Diagnosis not present

## 2018-07-17 DIAGNOSIS — N3946 Mixed incontinence: Secondary | ICD-10-CM

## 2018-07-17 DIAGNOSIS — F419 Anxiety disorder, unspecified: Secondary | ICD-10-CM

## 2018-07-17 DIAGNOSIS — S79929A Unspecified injury of unspecified thigh, initial encounter: Secondary | ICD-10-CM | POA: Diagnosis not present

## 2018-07-17 DIAGNOSIS — S72102D Unspecified trochanteric fracture of left femur, subsequent encounter for closed fracture with routine healing: Secondary | ICD-10-CM | POA: Diagnosis not present

## 2018-07-17 DIAGNOSIS — I10 Essential (primary) hypertension: Secondary | ICD-10-CM

## 2018-07-17 DIAGNOSIS — K5909 Other constipation: Secondary | ICD-10-CM

## 2018-07-17 DIAGNOSIS — E7800 Pure hypercholesterolemia, unspecified: Secondary | ICD-10-CM | POA: Diagnosis not present

## 2018-07-17 DIAGNOSIS — Z4789 Encounter for other orthopedic aftercare: Secondary | ICD-10-CM | POA: Diagnosis not present

## 2018-07-17 DIAGNOSIS — S72002S Fracture of unspecified part of neck of left femur, sequela: Secondary | ICD-10-CM | POA: Diagnosis not present

## 2018-07-17 LAB — BASIC METABOLIC PANEL
Anion gap: 5 (ref 5–15)
BUN: 17 mg/dL (ref 8–23)
CO2: 22 mmol/L (ref 22–32)
Calcium: 7.4 mg/dL — ABNORMAL LOW (ref 8.9–10.3)
Chloride: 105 mmol/L (ref 98–111)
Creatinine, Ser: 0.87 mg/dL (ref 0.44–1.00)
GFR calc Af Amer: >60 mL/min (ref >60)
GFR calc non Af Amer: >60 mL/min (ref >60)
Glucose, Bld: 117 mg/dL — ABNORMAL HIGH (ref 70–99)
Potassium: 4.4 mmol/L (ref 3.5–5.1)
Sodium: 132 mmol/L — ABNORMAL LOW (ref 135–145)

## 2018-07-17 MED ORDER — LORAZEPAM 1 MG PO TABS: 1.0000 mg | ORAL_TABLET | Freq: Three times a day (TID) | ORAL | 0 refills | Status: DC | PRN

## 2018-07-17 MED ORDER — POLYETHYLENE GLYCOL 3350 17 G PO PACK: 17.0000 g | PACK | Freq: Every day | ORAL | 0 refills | Status: DC | PRN

## 2018-07-17 MED ORDER — TIZANIDINE HCL 4 MG PO TABS: 4.0000 mg | ORAL_TABLET | Freq: Three times a day (TID) | ORAL | 0 refills | Status: DC | PRN

## 2018-07-17 NOTE — Social Work (Signed)
Clinical Social Worker facilitated patient discharge including contacting patient family and facility to confirm patient discharge plans.  Clinical information faxed to facility and family agreeable with plan.  CSW arranged ambulance transport via [place] to [facility]. Pick up for 2:30pm RN to call [phone]  with report prior to discharge.  Clinical Social Worker will sign off for now as social work intervention is no longer needed. Please consult [place] again if new need arises.  Westley Hummer, MSW, [place] Social Worker [phone]

## 2018-07-17 NOTE — Progress Notes (Signed)
Patient discharging to [facility] center. Report called in to [facility] LPN. Awaiting transportation.

## 2018-07-17 NOTE — Plan of Care (Signed)
  Problem: Elimination: Goal: Will not experience complications related to bowel motility Outcome: Progressing   Problem: Safety: Goal: Ability to remain free from injury will improve Outcome: Progressing   Problem: Pain Managment: Goal: General experience of comfort will improve Outcome: Progressing   

## 2018-07-17 NOTE — Progress Notes (Signed)
Location:   [place] Room Number: 160 P Place of Service:  SNF (31)   CODE STATUS: Full Code  Allergies  Allergen Reactions  . Morphine And Related Other (See Comments)    Patient stated that she thinks that morphine was one of the medication that she had a bad reaction to. She can not remember the other. She said that she blacked out when she took it.  . Other     Allergic to  med, does not know name    Chief Complaint  Patient presents with  . Hospitalization Follow-up    Hospital follow up    HPI:  She is a 78 year old woman who has been hospitalized 07-13-18 through 07-17-18. She had a fall and suffered a left hip fracture. She is admitted to this facility for short term rehab with her goal to return back home. She denies any uncontrolled left hip pain. She denies any insomnia; no anxiety or depressive thoughts. She will continue to be followed for her chronic illnesses including: osteoporosis; hypertension; gerd.   Past Medical History:  Diagnosis Date  . Anxiety   . Depression   . GERD (gastroesophageal reflux disease)   . Hypercholesteremia   . Hypertension     Past Surgical History:  Procedure Laterality Date  . BREAST REDUCTION SURGERY    . CESAREAN SECTION    . COLONOSCOPY  08/2005  . ESOPHAGOGASTRODUODENOSCOPY N/A 08/19/2013   Procedure: ESOPHAGOGASTRODUODENOSCOPY (EGD);  Surgeon: Rogene Houston, MD;  [facility]: [facility];  Service: Endoscopy;  Laterality: N/A;  11:15 - rescheduled to 7:30 - Ann notified pt  . excision of submandibular gland    . HEMORRHOID SURGERY    . INTRAMEDULLARY (IM) NAIL INTERTROCHANTERIC Left 07/14/2018   Procedure: INTRAMEDULLARY (IM) NAIL, LEFT HIP;  Surgeon: Renette Butters, MD;  Location: [place];  Service: Orthopedics;  Laterality: Left;  . STOMACH SURGERY     years ago. ? reason  . TONSILLECTOMY      Social History   Socioeconomic History  . Marital status: Widowed    Spouse name: Not on file   . Number of children: Not on file  . Years of education: Not on file  . Highest education level: Not on file  Occupational History  . Not on file  Social Needs  . Financial resource strain: Not on file  . Food insecurity:    Worry: Not on file    Inability: Not on file  . Transportation needs:    Medical: Not on file    Non-medical: Not on file  Tobacco Use  . Smoking status: Never Smoker  . Smokeless tobacco: Never Used  Substance and Sexual Activity  . Alcohol use: No    Alcohol/week: 0.0 standard drinks  . Drug use: No  . Sexual activity: Not Currently    Birth control/protection: Post-menopausal  Lifestyle  . Physical activity:    Days per week: Not on file    Minutes per session: Not on file  . Stress: Not on file  Relationships  . Social connections:    Talks on phone: Not on file    Gets together: Not on file    Attends religious service: Not on file    Active member of club or organization: Not on file    Attends meetings of clubs or organizations: Not on file    Relationship status: Not on file  . Intimate partner violence:    Fear of  current or ex partner: Not on file    Emotionally abused: Not on file    Physically abused: Not on file    Forced sexual activity: Not on file  Other Topics Concern  . Not on file  Social History Narrative  . Not on file   Family History  Problem Relation Age of Onset  . Sleep apnea Son   . Diabetes Son       VITAL SIGNS BP 122/79   Pulse 67   Temp 98.4 F (36.9 C)   Resp 18   Ht 4\' 11"  (1.499 m)   Wt 99 lb (44.9 kg)   SpO2 100%   BMI 20.00 kg/m   Outpatient Encounter Medications as of 07/17/2018  Medication Sig  . alendronate (FOSAMAX) 70 MG tablet Take 70 mg by mouth once a week. Take with a full glass of water on an empty stomach.  Marland Kitchen atenolol (TENORMIN) 50 MG tablet Take 50 mg by mouth daily.  . celecoxib (CELEBREX) 200 MG capsule Take 200 mg by mouth daily after breakfast.  . diphenhydrAMINE (BENADRYL)  25 MG tablet Take 25 mg by mouth every 6 (six) hours as needed for allergies.  Marland Kitchen enoxaparin (LOVENOX) 40 MG/0.4ML injection Inject 0.4 mLs (40 mg total) into the skin daily for 30 doses. For 30 days post op for DVT prophylaxis  . esomeprazole (NEXIUM) 40 MG capsule Take 40 mg by mouth daily after breakfast.   . HYDROcodone-acetaminophen (NORCO) 5-325 MG tablet Take 1 tablet by mouth every 4 (four) hours as needed for severe pain (Use Tylenol for mild / moderate pain.).  Marland Kitchen LORazepam (ATIVAN) 1 MG tablet Take 1 tablet (1 mg total) by mouth every 8 (eight) hours as needed for anxiety.  . NON FORMULARY Diet type: NAS  . oxybutynin (DITROPAN) 5 MG tablet Take 5 mg by mouth 2 (two) times a day.  . polyethylene glycol (MIRALAX / GLYCOLAX) 17 g packet Take 17 g by mouth daily as needed for mild constipation.  . simvastatin (ZOCOR) 40 MG tablet Take 40 mg by mouth at bedtime.  Marland Kitchen tiZANidine (ZANAFLEX) 4 MG tablet Take 1 tablet (4 mg total) by mouth every 8 (eight) hours as needed for muscle spasms.      SIGNIFICANT DIAGNOSTIC EXAMS  TODAY:   07-13-18: left hip x-ray: Comminuted intratrochanteric left femoral fracture.  07-13-18: chest x-ray: Small sliding-type hiatal hernia.  No acute abnormality noted.  07-13-18: ct of head: Stable exam.  No acute intracranial abnormality.  LABS REVIEWED TODAY:   07-13-18: wbc 9.1; hgb 11.6; hct 35.6; mvc 100.8; plt 194 glucose 105; bun 29; creat 1.18; k+ 5.4; na++ 129  07-14-18: wbc 13.7; hct 9.4; hct 28.7; mcv 99.0 plt 181 glucose 150; bun 24; creat 1.06; k+ 4.6; na++ 132; ca 7.6 07-16-18: wbc 11.8; hgb 8.7; hct 26.6; mcv 100.0 plt 147 glucose 118; bun 20; creat 0.88; k+ 4.3; na++ 136; ca 7.5   Review of Systems  Constitutional: Negative for malaise/fatigue.  Respiratory: Negative for cough and shortness of breath.   Cardiovascular: Negative for chest pain, palpitations and leg swelling.  Gastrointestinal: Negative for abdominal pain, constipation and heartburn.   Musculoskeletal: Positive for joint pain. Negative for back pain and myalgias.       Left hip pain   Skin: Negative.   Neurological: Negative for dizziness.  Psychiatric/Behavioral: The patient is not nervous/anxious.     Physical Exam Constitutional:      General: She is not in acute distress.  Appearance: She is underweight. She is not diaphoretic.  Neck:     Musculoskeletal: Neck supple.     Thyroid: No thyromegaly.  Cardiovascular:     Rate and Rhythm: Normal rate and regular rhythm.     Pulses: Normal pulses.     Heart sounds: Normal heart sounds.  Pulmonary:     Effort: Pulmonary effort is normal. No respiratory distress.     Breath sounds: Normal breath sounds.  Abdominal:     General: Bowel sounds are normal. There is no distension.     Palpations: Abdomen is soft.     Tenderness: There is no abdominal tenderness.  Musculoskeletal:     Right lower leg: No edema.     Left lower leg: No edema.     Comments: Is able to move all extremities Is status post Left hip nail  Lymphadenopathy:     Cervical: No cervical adenopathy.  Skin:    General: Skin is warm and dry.     Comments: Incision line without signs of infection present   Neurological:     Mental Status: She is alert and oriented to person, place, and time.  Psychiatric:        Mood and Affect: Mood normal.       ASSESSMENT/ PLAN:  TODAY:   1. Intertrochanteric fracture closed left sequela: is stable will continue therapy as directed; will follow up with orthopedics will continue lovenox 40 mg daily for 30 days; zanaflex 4 mg every 8 hours as needed vicodin 5/325 mg every 4 hours for 7 days.   2. Age related osteoporosis with current pathological fracture sequela: is without change: will continue fosamax 70 mg weekly   3. Essential hypertension: is stable b/p 122/79: will continue tenormin 50 mg daily   4.  GERD without esophagitis: is stable will continue nexium 40 mg daily   5. Chronic radicular  low back pain: is stable will continue celebrex 200 mg daily   6. Mixed stress and urge urinary incontinence: is stable will continue ditropan 5 mg twice daily   7. Dyslipidemia: is stable will continue zocor 40 mg daily   8. Anxiety: is stable will continue ativan 1 mg every 8 hours as needed for 14 days.   9. Chronic constipation: is stable will continue miralax daily as needed  10. Acute blood anemia: is stable hgb 8.7   Will check cbc; bmp on 07-22-18.      MD is aware of resident's narcotic use and is in agreement with current plan of care. We will attempt to wean resident as apropriate   Ok Edwards NP [facility] Adult [facility]  Contact [phone] Monday through Friday 8am- 5pm  After hours call [phone]

## 2018-07-17 NOTE — Progress Notes (Signed)
PT Cancellation Note  Patient Details Name: [name] [name] MRN: [id number] DOB: [birth year]   Cancelled Treatment:    Reason Eval/Treat Not Completed: Other (comment). Pt to d/c to SNF this afternoon. Will defer treatment.  Mabeline Caras, PT, DPT Acute Rehabilitation Services  Pager [phone] Office [place] 07/17/2018, 1:05 PM

## 2018-07-17 NOTE — Discharge Instructions (Signed)
Hip Fracture  A hip fracture is a break in the upper part of the thigh bone (femur). This is usually the result of an injury, commonly a fall. What are the causes? This condition may be caused by:  A direct hit or injury (trauma) to the side of the hip, such as from a fall or a car accident. What increases the risk? You are more likely to develop this condition if:  You have poor balance or an unsteady walking pattern (gait). Certain conditions contribute to poor balance, including Parkinson disease and dementia.  You have thinning or weakening of your bones, such as from osteopenia or osteoporosis.  You have cancer that spreads to the leg bones.  You have certain conditions that can weaken your bones, such as thyroid disorders, intestine disorders, or a lack (deficiency) of certain nutrients.  You smoke.  You take certain medicines, such as steroids.  You have a history of broken bones. What are the signs or symptoms? Symptoms of this condition include:  Pain over the injured hip. This is commonly felt on the side of the hip or in the front groin area.  Stiffness, bruising, and swelling over the hip.  Pain with movement of the leg, especially lifting it up. Pain often gets better with rest.  Difficulty or inability to stand, walk, or use the leg to support body weight (put weight on the leg).  The leg rolling outward when lying down.  The affected leg being shorter than the other leg. How is this diagnosed? This condition may be diagnosed based on:  Your symptoms.  A physical exam.  X-rays. These may be done: ? To confirm the diagnosis. ? To determine the type and location of the fracture. ? To check for other injuries.  MRI or CT scans. These may be done if the fracture is not visible on an X-ray. How is this treated? Treatment for this condition depends on the severity and location of your fracture. In most cases, surgery is necessary. Surgery may  involve:  Repairing the fracture with a screw, nail, or rod to hold the bone in place (open reduction and internal fixation, ORIF).  Replacing the damaged parts of the femur with metal implants (hemiarthroplasty or arthroplasty). If your fracture is less severe, or if you are not eligible for surgery, you may have non-surgical treatment. Non-surgical treatment may involve:  Using crutches, a walker, or a wheelchair until your health care provider says that you can support (bear) weight on your hip.  Medicines to help reduce pain and swelling.  Having regular X-rays to monitor your fracture and make sure that it is healing.  Physical therapy. You may need physical therapy after surgery, too. Follow these instructions at home: Activity  Do not use your injured leg to support your body weight until your health care provider says that you can. ? Follow standing and walking restrictions as told by your health care provider. ? Use crutches, a walker, or a wheelchair as directed.  Avoid any activities that cause pain or irritation in your hip. Ask your health care provider what activities are safe for you.  Do not drive or use heavy machinery until your health care provider approves.  If physical therapy was prescribed, do exercises as told by your health care provider. General instructions  Take over-the-counter and prescription medicines only as told by your health care provider.  If directed, put ice on the injured area: ? Put ice in a plastic bag. ?   Place a towel between your skin and the bag. ? Leave the ice on for 20 minutes, 2-3 times a day.  Do not use any products that contain nicotine or tobacco, such as cigarettes and e-cigarettes. These can delay bone healing. If you need help quitting, ask your health care provider.  Keep all follow-up visits as told by your health care provider. This is important. How is this prevented?  To prevent falls at home: ? Use a cane, walker,  or wheelchair as directed. ? Make sure your rooms and hallways are free of clutter, obstacles, and cords. ? Install grab bars in your bedroom and bathrooms. ? Always use handrails when going up and down stairs. ? Use nightlights around the house.  Exercise regularly. Ask what forms of exercise are safe for you, such as walking and strength and balance exercises.  Visit an eye doctor regularly to have your eyesight checked. This can help prevent falls.  Make sure you get enough calcium and vitamin D.  Do not use any products that contain nicotine or tobacco, such as cigarettes and e-cigarettes. If you need help quitting, ask your health care provider.  Limit alcohol use.  If you have an underlying condition that caused your hip fracture, work with your health care provider to manage your condition. Contact a health care provider if:  Your pain gets worse or it does not get better with rest or medicine.  You develop any of the following in your leg or foot: ? Numbness. ? Tingling. ? A change in skin color (discoloration). ? Skin feeling cold to the touch. Get help right away if:  Your pain suddenly gets worse.  You cannot move your hip. Summary  A hip fracture is a break in the upper part of the thigh bone (femur).  Treatment typically require surgical management to restore stability and function to the hip.  Pain medicine and icing of the affected leg can help manage pain and swelling. Follow directions as told by your health care provider. This information is not intended to replace advice given to you by your health care provider. Make sure you discuss any questions you have with your health care provider. Document Released: 02/06/2005 Document Revised: 10/27/2017 Document Reviewed: 03/11/2016 [employer] Interactive Patient Education  2019 [employer]  

## 2018-07-17 NOTE — Progress Notes (Signed)
    Subjective: Patient reports pain as improving, controlled.  Tolerating diet.  Urinating.  No CP, SOB.  Feeling ready to move to SNF.  Objective:   VITALS:   Vitals:   07/16/18 0404 07/16/18 1416 07/16/18 2000 07/17/18 0407  BP: (!) 108/94 125/64 131/67 122/79  Pulse: 69 62 67 67  Resp: 14 20 16 18   Temp: 98.3 F (36.8 C) 98.4 F (36.9 C) 98.4 F (36.9 C) 98.4 F (36.9 C)  TempSrc: Oral Oral Oral Oral  SpO2: 97% 99% 99% 100%  Weight:      Height:       CBC Latest Ref Rng & Units 07/16/2018 07/15/2018 07/14/2018  WBC 4.0 - 10.5 K/uL 11.8(H) 13.7(H) 13.7(H)  Hemoglobin 12.0 - 15.0 g/dL 8.7(L) 9.4(L) 11.0(L)  Hematocrit 36.0 - 46.0 % 26.6(L) 28.7(L) 33.2(L)  Platelets 150 - 400 K/uL 147(L) 181 189   BMP Latest Ref Rng & Units 07/17/2018 07/16/2018 07/15/2018  Glucose 70 - 99 mg/dL 117(H) 118(H) 150(H)  BUN 8 - 23 mg/dL 17 20 24(H)  Creatinine 0.44 - 1.00 mg/dL 0.87 0.88 1.06(H)  BUN/Creat Ratio 6 - 22 (calc) - - -  Sodium 135 - 145 mmol/L 132(L) 136 132(L)  Potassium 3.5 - 5.1 mmol/L 4.4 4.3 4.6  Chloride 98 - 111 mmol/L 105 108 102  CO2 22 - 32 mmol/L 22 22 21(L)  Calcium 8.9 - 10.3 mg/dL 7.4(L) 7.5(L) 7.6(L)   Intake/Output      05/26 0701 - 05/27 0700 05/27 0701 - 05/28 0700   P.O. 1000    I.V. (mL/kg) 450 (10)    IV Piggyback 250    Total Intake(mL/kg) 1700 (37.9)    Urine (mL/kg/hr) 2020 (1.9)    Stool 0    Total Output 2020    Net -320         Urine Occurrence 2 x    Stool Occurrence 1 x       Physical Exam: General: NAD.  Upright in bed.  Calm, talkative.   MSK LLE: Neurovascularly intact Sensation intact distally Feet warm Dorsiflexion/Plantar flexion intact Incision: dressing C/D/I   Assessment: 3 Days Post-Op  S/P Procedure(s) (LRB): INTRAMEDULLARY (IM) NAIL, LEFT HIP (Left) by Dr. Ernesta Amble. Percell Miller on 07/14/2018  Principal Problem:   Hip fracture ([place]) Active Problems:   GERD (gastroesophageal reflux disease)   Hypertension  Hypercholesteremia   Closed left intertrochanteric femur fracture, status post cephalo-medullary IM nail Doing well postop day 3 Stable from an orthopedic perspective Pain controlled   Plan: Up with therapy Incentive Spirometry Apply ice PRN  Weightbearing: WBAT LLE Insicional and dressing care: Dressings left intact until follow-up Showering: Keep dressing dry VTE prophylaxis: Lovenox 40mg  qd, SCDs, ambulation Pain control: Tylenol.  Minimize narcotics.  Norco for breakthrough pain. Follow - up plan:  Follow up in the office with Dr. Alain Marion in 2 weeks.  Please call with questions.  Contact information:  [name] MD, Roxan Hockey PA-C  Dispo: Planned to discharge to [place], PA-C 07/17/2018, 7:52 AM

## 2018-07-17 NOTE — Discharge Summary (Signed)
Discharge Summary  [name] [name] ZOX:[id number] DOB: [birth year]  PCP: Sinda Du, MD  Admit date: 07/13/2018 Discharge date: 07/17/2018  Time spent: 35 minutes  Recommendations for Outpatient Follow-up:  1. Follow-up with orthopedic surgery 2. Follow-up with your primary care provider 3. Take your medications as prescribed 4. Continue physical therapy 5. Fall precautions  Discharge Diagnoses:  Active Hospital Problems   Diagnosis Date Noted  . Hip fracture ([place]) 07/13/2018  . Hypertension   . Hypercholesteremia   . GERD (gastroesophageal reflux disease) 04/09/2017    Resolved Hospital Problems  No resolved problems to display.    Discharge Condition: Stable  Diet recommendation: Resume previous diet  Vitals:   07/17/18 0407 07/17/18 0819  BP: 122/79 131/69  Pulse: 67 69  Resp: 18   Temp: 98.4 F (36.9 C)   SpO2: 100%     History of present illness:  78 y.o.femalewith medical history significant ofhypertension, hyperlipidemia was shopping today for groceries when she slipped in the aisle and fell injuring her left leg. She did not trip or fall anything she states she just lost her footing and fell. She did hit her head but did not have loss of consciousness. She denies any previous illnesses no previous respiratory illnesses or shortness of breath cough nausea vomiting or diarrhea. Patient found to have a left femur fracture referred for admission for such. Orthopedic surgery has been consulted planning on taking her to the OR at [facility] tomorrow. Therefore patient be transferred to [facility] from [place] [facility].   Left femur fracture repair on 07/14/2018.  PT OT consulted and recommended SNF.  Pain management in place, vital signs stable.  07/17/18: Patient seen and examined at her bedside.  No acute events overnight.  She has no new concerns.  States her pain is improving.  States she is ready to go to SNF for physical therapy.  Vital signs and labs  reviewed and are stable.  On the day of discharge, the patient was hemodynamically stable.  She will need to follow-up with orthopedic surgery and her primary care provider.  She will need to continue physical therapy at SNF.  Fall precautions.   Hospital Course:  Principal Problem:   Hip fracture ([place]) Active Problems:   GERD (gastroesophageal reflux disease)   Hypertension   Hypercholesteremia  Hip fracture ([place]) -Left femur fracture -Orthopedic Surgery followed and pt now s/p surgery 5/24 -Continue analgesic as needed -PT/OT consulted with recommendations for SNF.  -Continue physical therapy at SNF  GERD (gastroesophageal reflux disease) -Continue PPI -Presently stable  Hypertension -Blood pressure is normotensive -Continue home meds -Follow-up with PCP  Hypercholesteremia -will continue statin per home regimen -Stable at present -Follow-up with PCP  Chronic anxiety -Pt noted to have 1mg  ativan 4 time daily scheduled on home med rec -cont on PRN ativan while in hospital. Stable  Resolved AKI -Given IVF as per above -Cr improved from 1.18 to 0.87 today -Follow-up with your PCP  Mild euvolemic hyponatremia Asymptomatic Sodium 132 from 136 yesterday Suspect dilutional Follow-up with PCP   Code Status: Full   Consultants:   Orthopedic Surgery  Procedures:   L hip intramedullary nail 5/24     Discharge Exam: BP 131/69   Pulse 69   Temp 98.4 F (36.9 C) (Oral)   Resp 18   Ht 4\' 11"  (1.499 m)   Wt 44.9 kg   SpO2 100%   BMI 20.00 kg/m  . General: 78 y.o. year-old female well developed well nourished  in no acute distress.  Alert and oriented x3. . Cardiovascular: Regular rate and rhythm with no rubs or gallops.  No thyromegaly or JVD noted.   Marland Kitchen Respiratory: Clear to auscultation with no wheezes or rales. Good inspiratory effort. . Abdomen: Soft nontender nondistended with normal bowel sounds x4 quadrants. . Musculoskeletal: No lower  extremity edema. 2/4 pulses in all 4 extremities. Marland Kitchen Psychiatry: Mood is appropriate for condition and setting  Discharge Instructions You were cared for by a hospitalist during your hospital stay. If you have any questions about your discharge medications or the care you received while you were in the hospital after you are discharged, you can call the unit and asked to speak with the hospitalist on call if the hospitalist that took care of you is not available. Once you are discharged, your primary care physician will handle any further medical issues. Please note that NO REFILLS for any discharge medications will be authorized once you are discharged, as it is imperative that you return to your primary care physician (or establish a relationship with a primary care physician if you do not have one) for your aftercare needs so that they can reassess your need for medications and monitor your lab values.   Allergies as of 07/17/2018      Reactions   Morphine And Related Other (See Comments)   Patient stated that she thinks that morphine was one of the medication that she had a bad reaction to. She can not remember the other. She said that she blacked out when she took it.   Other    Allergic to  med, does not know name      Medication List    STOP taking these medications   oxyCODONE-acetaminophen 5-325 MG tablet Commonly known as:  PERCOCET/ROXICET   solifenacin 5 MG tablet Commonly known as:  VESICARE   zolpidem 10 MG tablet Commonly known as:  AMBIEN     TAKE these medications   alendronate 70 MG tablet Commonly known as:  FOSAMAX Take 70 mg by mouth once a week. Take with a full glass of water on an empty stomach.   atenolol 50 MG tablet Commonly known as:  TENORMIN Take 50 mg by mouth daily.   celecoxib 200 MG capsule Commonly known as:  CELEBREX Take 200 mg by mouth daily after breakfast.   diphenhydrAMINE 25 MG tablet Commonly known as:  BENADRYL Take 25 mg by mouth  every 6 (six) hours as needed for allergies.   enoxaparin 40 MG/0.4ML injection Commonly known as:  LOVENOX Inject 0.4 mLs (40 mg total) into the skin daily for 30 doses. For 30 days post op for DVT prophylaxis   esomeprazole 40 MG capsule Commonly known as:  NEXIUM Take 40 mg by mouth daily after breakfast.   HYDROcodone-acetaminophen 5-325 MG tablet Commonly known as:  Norco Take 1 tablet by mouth every 4 (four) hours as needed for severe pain (Use Tylenol for mild / moderate pain.).   LORazepam 1 MG tablet Commonly known as:  ATIVAN Take 1 tablet (1 mg total) by mouth every 8 (eight) hours as needed for anxiety. What changed:    when to take this  reasons to take this  additional instructions   oxybutynin 5 MG tablet Commonly known as:  DITROPAN Take 5 mg by mouth 2 (two) times a day.   polyethylene glycol 17 g packet Commonly known as:  MIRALAX / GLYCOLAX Take 17 g by mouth daily as needed for mild  constipation.   simvastatin 40 MG tablet Commonly known as:  ZOCOR Take 40 mg by mouth at bedtime.   tiZANidine 4 MG tablet Commonly known as:  ZANAFLEX Take 1 tablet (4 mg total) by mouth every 8 (eight) hours as needed for muscle spasms.      Allergies  Allergen Reactions  . Morphine And Related Other (See Comments)    Patient stated that she thinks that morphine was one of the medication that she had a bad reaction to. She can not remember the other. She said that she blacked out when she took it.  . Other     Allergic to  med, does not know name    Contact information for follow-up providers    Renette Butters, MD In 2 weeks.   Specialty:  Orthopedic Surgery Contact information: [address] [place] [address] [phone]        Sinda Du, MD. Call in 1 day(s).   Specialty:  Pulmonary Disease Why:  Please call for a post hospital follow-up appointment. Contact information: [place]  [phone]            Contact information for after-discharge care    [facility] .   Service:  Skilled Nursing Contact information: [address] [place] [phone]                   The results of significant diagnostics from this hospitalization (including imaging, microbiology, ancillary and laboratory) are listed below for reference.    Significant Diagnostic Studies: Dg Chest 2 View  Result Date: 07/13/2018 CLINICAL DATA:  Recent fall with known left femoral fracture EXAM: CHEST - 2 VIEW COMPARISON:  03/05/2014, CT from 05/26/2017 FINDINGS: Cardiac shadow is within normal limits. Small hiatal hernia is again noted. Lungs are well aerated bilaterally. No focal infiltrate or effusion is seen. No pneumothorax is noted. No acute bony abnormality is seen. IMPRESSION: Small sliding-type hiatal hernia.  No acute abnormality noted. Electronically Signed   By: Inez Catalina M.D.   On: 07/13/2018 22:56   Ct Head Wo Contrast  Result Date: 07/13/2018 CLINICAL DATA:  Fall with head trauma. EXAM: CT HEAD WITHOUT CONTRAST TECHNIQUE: Contiguous axial images were obtained from the base of the skull through the vertex without intravenous contrast. COMPARISON:  04/28/2012 FINDINGS: Brain: There is no evidence for acute hemorrhage, hydrocephalus, mass lesion, or abnormal extra-axial fluid collection. No definite CT evidence for acute infarction. Vascular: No hyperdense vessel or unexpected calcification. Skull: No evidence for fracture. No worrisome lytic or sclerotic lesion. Sinuses/Orbits: The visualized paranasal sinuses and mastoid air cells are clear. Visualized portions of the globes and intraorbital fat are unremarkable. Other: None. IMPRESSION: 1. Stable exam.  No acute intracranial abnormality. Electronically Signed   By: Misty Stanley M.D.   On: 07/13/2018 23:07   Dg C-arm 1-60 Min  Result Date:  07/14/2018 CLINICAL DATA:  78 year old female with fracture EXAM: DG C-ARM 61-120 MIN; LEFT FEMUR 2 VIEWS COMPARISON:  07/13/2018 FINDINGS: Multiple intraoperative fluoroscopic spot images demonstrate ORIF of left hip fracture with antegrade intramedullary rod and cannulated screw. IMPRESSION: Limited intraoperative fluoroscopic spot images demonstrating ORIF of left hip. Please refer to the dictated operative report for full details of intraoperative findings and procedure. Electronically Signed   By: Corrie Mckusick D.O.   On: 07/14/2018 09:23   Dg Hip Unilat W Or Wo Pelvis 2-3  Views Left  Result Date: 07/13/2018 CLINICAL DATA:  Recent fall with left hip pain, initial encounter EXAM: DG HIP (WITH OR WITHOUT PELVIS) 3V LEFT COMPARISON:  None. FINDINGS: Comminuted intratrochanteric left femoral fracture is noted with impaction at the fracture site. Femoral head is well seated. Pelvic ring is intact. No soft tissue abnormality is noted. IMPRESSION: Comminuted intratrochanteric left femoral fracture. Electronically Signed   By: Inez Catalina M.D.   On: 07/13/2018 22:55   Dg Femur Min 2 Views Left  Result Date: 07/14/2018 CLINICAL DATA:  78 year old female with fracture EXAM: DG C-ARM 61-120 MIN; LEFT FEMUR 2 VIEWS COMPARISON:  07/13/2018 FINDINGS: Multiple intraoperative fluoroscopic spot images demonstrate ORIF of left hip fracture with antegrade intramedullary rod and cannulated screw. IMPRESSION: Limited intraoperative fluoroscopic spot images demonstrating ORIF of left hip. Please refer to the dictated operative report for full details of intraoperative findings and procedure. Electronically Signed   By: Corrie Mckusick D.O.   On: 07/14/2018 09:23    Microbiology: Recent Results (from the past 240 hour(s))  SARS Coronavirus 2 (CEPHEID - Performed in [place] hospital lab), Hosp Order     Status: None   Collection Time: 07/14/18 12:31 AM  Result Value Ref Range Status   SARS Coronavirus 2 NEGATIVE  NEGATIVE Final    Comment: (NOTE) If result is NEGATIVE SARS-CoV-2 target nucleic acids are NOT DETECTED. The SARS-CoV-2 RNA is generally detectable in upper and lower  respiratory specimens during the acute phase of infection. The lowest  concentration of SARS-CoV-2 viral copies this assay can detect is 250  copies / mL. A negative result does not preclude SARS-CoV-2 infection  and should not be used as the sole basis for treatment or other  patient management decisions.  A negative result may occur with  improper specimen collection / handling, submission of specimen other  than nasopharyngeal swab, presence of viral mutation(s) within the  areas targeted by this assay, and inadequate number of viral copies  (<250 copies / mL). A negative result must be combined with clinical  observations, patient history, and epidemiological information. If result is POSITIVE SARS-CoV-2 target nucleic acids are DETECTED. The SARS-CoV-2 RNA is generally detectable in upper and lower  respiratory specimens dur ing the acute phase of infection.  Positive  results are indicative of active infection with SARS-CoV-2.  Clinical  correlation with patient history and other diagnostic information is  necessary to determine patient infection status.  Positive results do  not rule out bacterial infection or co-infection with other viruses. If result is PRESUMPTIVE POSTIVE SARS-CoV-2 nucleic acids MAY BE PRESENT.   A presumptive positive result was obtained on the submitted specimen  and confirmed on repeat testing.  While 2019 novel coronavirus  (SARS-CoV-2) nucleic acids may be present in the submitted sample  additional confirmatory testing may be necessary for epidemiological  and / or clinical management purposes  to differentiate between  SARS-CoV-2 and other Sarbecovirus currently known to infect humans.  If clinically indicated additional testing with an alternate test  methodology [phone]) is  advised. The SARS-CoV-2 RNA is generally  detectable in upper and lower respiratory sp ecimens during the acute  phase of infection. The expected result is Negative. Fact Sheet for Patients:  StrictlyIdeas.no Fact Sheet for Healthcare Providers: BankingDealers.co.za This test is not yet approved or cleared by the [place] FDA and has been authorized for detection and/or diagnosis of SARS-CoV-2 by FDA under an Emergency Use Authorization (EUA).  This EUA will remain in  effect (meaning this test can be used) for the duration of the COVID-19 declaration under Section 564(b)(1) of the Act, 21 U.S.C. section 360bbb-3(b)(1), unless the authorization is terminated or revoked sooner. Performed at [facility], [place] [address] [place], [place] [address]   Surgical PCR screen     Status: None   Collection Time: 07/14/18  4:45 AM  Result Value Ref Range Status   MRSA, PCR NEGATIVE NEGATIVE Final   Staphylococcus aureus NEGATIVE NEGATIVE Final    Comment: (NOTE) The Xpert SA Assay (FDA approved for NASAL specimens in patients 29 years of age and older), is one component of a comprehensive surveillance program. It is not intended to diagnose infection nor to guide or monitor treatment. Performed at [facility], [place] [address], [place], [place] [address]      Labs: Basic Metabolic Panel: Recent Labs  Lab 07/13/18 2208 07/14/18 1126 07/15/18 0959 07/16/18 0237 07/17/18 0225  NA 129*  --  132* 136 132*  K 5.4*  --  4.6 4.3 4.4  CL 101  --  102 108 105  CO2 21*  --  21* 22 22  GLUCOSE 105*  --  150* 118* 117*  BUN 29*  --  24* 20 17  CREATININE 1.18* 1.11* 1.06* 0.88 0.87  CALCIUM 7.8*  --  7.6* 7.5* 7.4*   Liver Function Tests: No results for input(s): AST, ALT, ALKPHOS, BILITOT, PROT, ALBUMIN in the last 168 hours. No results for input(s): LIPASE, AMYLASE in the last 168 hours. No results for input(s):  AMMONIA in the last 168 hours. CBC: Recent Labs  Lab 07/13/18 2208 07/14/18 1126 07/15/18 0959 07/16/18 0237  WBC 9.1 13.7* 13.7* 11.8*  NEUTROABS 6.3  --   --   --   HGB 11.6* 11.0* 9.4* 8.7*  HCT 35.6* 33.2* 28.7* 26.6*  MCV 100.8* 97.6 99.0 100.0  PLT 194 189 181 147*   Cardiac Enzymes: No results for input(s): CKTOTAL, CKMB, CKMBINDEX, TROPONINI in the last 168 hours. BNP: BNP (last 3 results) No results for input(s): BNP in the last 8760 hours.  ProBNP (last 3 results) No results for input(s): PROBNP in the last 8760 hours.  CBG: No results for input(s): GLUCAP in the last 168 hours.     Signed:  Kayleen Memos, MD [facility] 07/17/2018, 9:48 AM

## 2018-07-17 NOTE — TOC Transition Note (Signed)
Transition of Care [facility]) - CM/SW Discharge Note   Patient Details  Name: [name] [name] MRN: [id number] Date of Birth: 1940-12-29  [facility] CM/SW Contact:  Alexander [place], [place] Phone Number: 07/17/2018, 10:20 AM   Clinical Narrative:    Pt has bed available at [facility], spoke with admissions director Marianna Fuss and they have approval through [facility] Medicare. Pt information sent through hub.    Final next level of care: Skilled Nursing Facility Barriers to Discharge: Barriers Resolved   Patient Goals and CMS Choice Patient states their goals for this hospitalization and ongoing recovery are:: To get back to where people don't have to do things for her. CMS Medicare.gov Compare Post Acute Care list provided to:: Patient    Discharge Placement PASRR number recieved: 07/16/18            Patient chooses bed at: [facility] Patient to be transferred to facility by: [place] Name of family member notified: pt son Rush [facility] via telephone Patient and family notified of of transfer: 07/17/18  Discharge Plan and Services     Post Acute Care Choice: [place]                               Social Determinants of Health (SDOH) Interventions     Readmission Risk Interventions No flowsheet data found.

## 2018-07-18 ENCOUNTER — Encounter: Payer: Self-pay | Admitting: Internal Medicine

## 2018-07-18 ENCOUNTER — Other Ambulatory Visit: Payer: Self-pay | Admitting: Adult Health

## 2018-07-18 ENCOUNTER — Non-Acute Institutional Stay (SKILLED_NURSING_FACILITY): Payer: Medicare Other | Admitting: Internal Medicine

## 2018-07-18 DIAGNOSIS — E7800 Pure hypercholesterolemia, unspecified: Secondary | ICD-10-CM | POA: Diagnosis not present

## 2018-07-18 DIAGNOSIS — M8000XS Age-related osteoporosis with current pathological fracture, unspecified site, sequela: Secondary | ICD-10-CM

## 2018-07-18 DIAGNOSIS — S72002S Fracture of unspecified part of neck of left femur, sequela: Secondary | ICD-10-CM | POA: Diagnosis not present

## 2018-07-18 DIAGNOSIS — I10 Essential (primary) hypertension: Secondary | ICD-10-CM | POA: Diagnosis not present

## 2018-07-18 DIAGNOSIS — M810 Age-related osteoporosis without current pathological fracture: Secondary | ICD-10-CM | POA: Insufficient documentation

## 2018-07-18 DIAGNOSIS — K219 Gastro-esophageal reflux disease without esophagitis: Secondary | ICD-10-CM

## 2018-07-18 DIAGNOSIS — F419 Anxiety disorder, unspecified: Secondary | ICD-10-CM

## 2018-07-18 MED ORDER — HYDROCODONE-ACETAMINOPHEN 5-325 MG PO TABS: 1.0000 | ORAL_TABLET | ORAL | 0 refills | Status: AC | PRN

## 2018-07-18 MED ORDER — LORAZEPAM 1 MG PO TABS: 1.0000 mg | ORAL_TABLET | Freq: Three times a day (TID) | ORAL | 0 refills | Status: AC | PRN

## 2018-07-18 NOTE — Anesthesia Postprocedure Evaluation (Signed)
Anesthesia Post Note  Patient: Carinna B Hoke  Procedure(s) Performed: INTRAMEDULLARY (IM) NAIL, LEFT HIP (Left Hip)     Patient location during evaluation: PACU Anesthesia Type: General Level of consciousness: awake and alert Pain management: pain level controlled Vital Signs Assessment: post-procedure vital signs reviewed and stable Respiratory status: spontaneous breathing, nonlabored ventilation, respiratory function stable and patient connected to nasal cannula oxygen Cardiovascular status: blood pressure returned to baseline and stable Postop Assessment: no apparent nausea or vomiting Anesthetic complications: no    Last Vitals:  Vitals:   07/17/18 0407 07/17/18 0819  BP: 122/79 131/69  Pulse: 67 69  Resp: 18   Temp: 36.9 C   SpO2: 100%     Last Pain:  Vitals:   07/17/18 0800  TempSrc:   PainSc: 4                  Mesiah Manzo

## 2018-07-18 NOTE — Progress Notes (Signed)
Provider:  Veleta Miners, MD Location:  [facility] Room Number: 106 P Place of Service:  SNF (31)  PCP: Sinda Du, MD Patient Care Team: Sinda Du, MD as PCP - General (Internal Medicine)  Extended Emergency Contact Information Primary Emergency Contact: Stokes,Bill Address: [address]          [place], [place] [address] Johnnette Litter of [place] Phone: [phone] Mobile Phone: [phone] Relation: Son Secondary Emergency Contact: Massie Maroon [place] of [place] Phone: [phone] Relation: Friend  Code Status: Full Code Goals of Care: Advanced Directive information Advanced Directives 07/18/2018  Does Patient Have a Medical Advance Directive? No  Would patient like information on creating a medical advance directive? No - Patient declined  Pre-existing out of facility DNR order (yellow form or pink MOST form) -      Chief Complaint  Patient presents with  . New Admit To SNF    Admission    HPI: Patient is a 78 y.o. female seen today for admission to SNF for therapy Patient was in the hospital from  5/23 to 5/27 for left hip fracture.  She underwent ORIF on 5/24. She has a history of hyperlipidemia, hypertension, osteoporosis, chronic pain due to arthritis, anxiety, GERD, H/O dilated CBD follows with GI.  According to the patient she had a mechanical fall in a grocery store.  She says that her leg legs just gave away.  She denied any dizziness or loss of consciousness. She was found to have a left femur fracture.  She went underwent ORIF Her postop course with uneventful and now she is in SNF for therapy Patient lives with her son.  She says she was very active before the fall.  But not driving. She also said that she has had recurrent falls in the past especially when she is outside.  She sometimes uses cane. Her main complaint today was pain and insomnia.  She is already working with therapy and walking with a  walker and mild assist.  Past Medical History:  Diagnosis Date  . Anxiety   . Depression   . GERD (gastroesophageal reflux disease)   . Hypercholesteremia   . Hypertension    Past Surgical History:  Procedure Laterality Date  . BREAST REDUCTION SURGERY    . CESAREAN SECTION    . COLONOSCOPY  08/2005  . ESOPHAGOGASTRODUODENOSCOPY N/A 08/19/2013   Procedure: ESOPHAGOGASTRODUODENOSCOPY (EGD);  Surgeon: Rogene Houston, MD;  [facility];  Service: Endoscopy;  Laterality: N/A;  11:15 - rescheduled to 7:30 - Ann notified pt  . excision of submandibular gland    . HEMORRHOID SURGERY    . INTRAMEDULLARY (IM) NAIL INTERTROCHANTERIC Left 07/14/2018   Procedure: INTRAMEDULLARY (IM) NAIL, LEFT HIP;  Surgeon: Renette Butters, MD;  Location: [facility];  Service: Orthopedics;  Laterality: Left;  . STOMACH SURGERY     years ago. ? reason  . TONSILLECTOMY      reports that she has never smoked. She has never used smokeless tobacco. She reports that she does not drink alcohol or use drugs. Social History   Socioeconomic History  . Marital status: Widowed    Spouse name: Not on file  . Number of children: Not on file  . Years of education: Not on file  . Highest education level: Not on file  Occupational History  . Not on file  Social Needs  . Financial resource strain: Not on file  . Food insecurity:    Worry:  Not on file    Inability: Not on file  . Transportation needs:    Medical: Not on file    Non-medical: Not on file  Tobacco Use  . Smoking status: Never Smoker  . Smokeless tobacco: Never Used  Substance and Sexual Activity  . Alcohol use: No    Alcohol/week: 0.0 standard drinks  . Drug use: No  . Sexual activity: Not Currently    Birth control/protection: Post-menopausal  Lifestyle  . Physical activity:    Days per week: Not on file    Minutes per session: Not on file  . Stress: Not on file  Relationships  . Social connections:    Talks on phone: Not on file     Gets together: Not on file    Attends religious service: Not on file    Active member of club or organization: Not on file    Attends meetings of clubs or organizations: Not on file    Relationship status: Not on file  . Intimate partner violence:    Fear of current or ex partner: Not on file    Emotionally abused: Not on file    Physically abused: Not on file    Forced sexual activity: Not on file  Other Topics Concern  . Not on file  Social History Narrative  . Not on file    Functional Status Survey:    Family History  Problem Relation Age of Onset  . Sleep apnea Son   . Diabetes Son     Health Maintenance  Topic Date Due  . INFLUENZA VACCINE  09/21/2018  . TETANUS/TDAP  08/22/2023  . DEXA SCAN  Completed  . PNA vac Low Risk Adult  Completed    Allergies  Allergen Reactions  . Morphine And Related Other (See Comments)    Patient stated that she thinks that morphine was one of the medication that she had a bad reaction to. She can not remember the other. She said that she blacked out when she took it.  . Other     Allergic to  med, does not know name    Outpatient Encounter Medications as of 07/18/2018  Medication Sig  . alendronate (FOSAMAX) 70 MG tablet Take 70 mg by mouth once a week. Take with a full glass of water on an empty stomach.  Marland Kitchen atenolol (TENORMIN) 50 MG tablet Take 50 mg by mouth daily.  . celecoxib (CELEBREX) 200 MG capsule Take 200 mg by mouth daily after breakfast.  . diphenhydrAMINE (BENADRYL) 25 MG tablet Take 25 mg by mouth every 6 (six) hours as needed for allergies.  Marland Kitchen enoxaparin (LOVENOX) 40 MG/0.4ML injection Inject 0.4 mLs (40 mg total) into the skin daily for 30 doses. For 30 days post op for DVT prophylaxis  . esomeprazole (NEXIUM) 40 MG capsule Take 40 mg by mouth daily after breakfast.   . HYDROcodone-acetaminophen (NORCO) 5-325 MG tablet Take 1 tablet by mouth every 4 (four) hours as needed for severe pain (Use Tylenol for mild /  moderate pain.).  Marland Kitchen LORazepam (ATIVAN) 1 MG tablet Take 1 tablet (1 mg total) by mouth every 8 (eight) hours as needed for anxiety.  . NON FORMULARY Diet type: NAS  . oxybutynin (DITROPAN) 5 MG tablet Take 5 mg by mouth 2 (two) times a day.  . polyethylene glycol (MIRALAX / GLYCOLAX) 17 g packet Take 17 g by mouth daily as needed for mild constipation.  . simvastatin (ZOCOR) 40 MG tablet Take 40 mg  by mouth at bedtime.  Marland Kitchen tiZANidine (ZANAFLEX) 4 MG tablet Take 1 tablet (4 mg total) by mouth every 8 (eight) hours as needed for muscle spasms.   No facility-administered encounter medications on file as of 07/18/2018.     Review of Systems  Constitutional: Positive for activity change.  HENT: Negative.   Respiratory: Negative.   Cardiovascular: Negative.   Gastrointestinal: Positive for constipation.  Genitourinary: Negative.   Musculoskeletal: Positive for arthralgias and myalgias.  Skin: Negative.   Neurological: Positive for weakness.  Psychiatric/Behavioral: The patient is nervous/anxious.     Vitals:   07/18/18 0937  BP: 130/75  Pulse: 63  Resp: 20  Temp: 98.1 F (36.7 C)  Weight: 109 lb 1.6 oz (49.5 kg)  Height: 4\' 11"  (1.499 m)   Body mass index is 22.04 kg/m. Physical Exam Vitals signs reviewed.  Constitutional:      Appearance: Normal appearance.  HENT:     Head: Normocephalic.     Nose: Nose normal.     Mouth/Throat:     Mouth: Mucous membranes are moist.     Pharynx: Oropharynx is clear.  Eyes:     Pupils: Pupils are equal, round, and reactive to light.  Neck:     Musculoskeletal: Neck supple.  Cardiovascular:     Rate and Rhythm: Normal rate and regular rhythm.     Pulses: Normal pulses.     Heart sounds: Normal heart sounds.  Pulmonary:     Effort: Pulmonary effort is normal. No respiratory distress.     Breath sounds: Normal breath sounds. No wheezing or rales.  Abdominal:     General: Abdomen is flat. Bowel sounds are normal. There is no distension.      Palpations: Abdomen is soft.     Tenderness: There is no abdominal tenderness. There is no guarding.  Musculoskeletal:     Comments: Mild swelling Bilateral Left More then right  Skin:    General: Skin is warm and dry.  Neurological:     General: No focal deficit present.     Mental Status: She is alert and oriented to person, place, and time.  Psychiatric:        Mood and Affect: Mood normal.        Thought Content: Thought content normal.        Judgment: Judgment normal.     Labs reviewed: Basic Metabolic Panel: Recent Labs    07/15/18 0959 07/16/18 0237 07/17/18 0225  NA 132* 136 132*  K 4.6 4.3 4.4  CL 102 108 105  CO2 21* 22 22  GLUCOSE 150* 118* 117*  BUN 24* 20 17  CREATININE 1.06* 0.88 0.87  CALCIUM 7.6* 7.5* 7.4*   Liver Function Tests: No results for input(s): AST, ALT, ALKPHOS, BILITOT, PROT, ALBUMIN in the last 8760 hours. No results for input(s): LIPASE, AMYLASE in the last 8760 hours. No results for input(s): AMMONIA in the last 8760 hours. CBC: Recent Labs    07/13/18 2208 07/14/18 1126 07/15/18 0959 07/16/18 0237  WBC 9.1 13.7* 13.7* 11.8*  NEUTROABS 6.3  --   --   --   HGB 11.6* 11.0* 9.4* 8.7*  HCT 35.6* 33.2* 28.7* 26.6*  MCV 100.8* 97.6 99.0 100.0  PLT 194 189 181 147*   Cardiac Enzymes: No results for input(s): CKTOTAL, CKMB, CKMBINDEX, TROPONINI in the last 8760 hours. BNP: Invalid input(s): POCBNP No results found for: HGBA1C No results found for: TSH No results found for: VITAMINB12 No results found for:  FOLATE No results found for: IRON, TIBC, FERRITIN  Imaging and Procedures obtained prior to SNF admission: No results found.  Assessment/Plan Closed fracture of left hip, sequela WBAT  On Lovenox Pain Controlled on Norco and muscle relaxant Follow up with Ortho Doing well with therapy. Plans to go home with her husband  Essential hypertension Stable on Tenormin  Gastroesophageal reflux disease  On Prilosec CKD  Her Creat Improved with IV fluids Hypercholesteremia On Statin  Age-related osteoporosis  On Fosamax  Anxiety On Ativan  Dose was reduced in the hospital Anemia Post op  Will satrt her on iron with Meals Repeat CBC Falls Patient says she falls a lot at home Getting Therapy Ativan was reduced    Family/ staff Communication:   Labs/tests ordered: CBC and BMP  Total time spent in this patient care encounter was  _45  minutes; greater than 50% of the visit spent counseling patient and staff, reviewing records , Labs and coordinating care for problems addressed at this encounter.

## 2018-07-21 DIAGNOSIS — E785 Hyperlipidemia, unspecified: Secondary | ICD-10-CM | POA: Insufficient documentation

## 2018-07-21 DIAGNOSIS — D62 Acute posthemorrhagic anemia: Secondary | ICD-10-CM | POA: Insufficient documentation

## 2018-07-21 DIAGNOSIS — K5909 Other constipation: Secondary | ICD-10-CM | POA: Insufficient documentation

## 2018-07-21 DIAGNOSIS — S72142S Displaced intertrochanteric fracture of left femur, sequela: Secondary | ICD-10-CM | POA: Insufficient documentation

## 2018-07-22 ENCOUNTER — Encounter (HOSPITAL_COMMUNITY)
Admission: RE | Admit: 2018-07-22 | Discharge: 2018-07-22 | Disposition: A | Discharge status: Home / Self Care | Payer: Medicare Other | Source: Skilled Nursing Facility | Attending: Internal Medicine | Admitting: Internal Medicine

## 2018-07-22 DIAGNOSIS — E785 Hyperlipidemia, unspecified: Secondary | ICD-10-CM | POA: Insufficient documentation

## 2018-07-22 DIAGNOSIS — N3946 Mixed incontinence: Secondary | ICD-10-CM | POA: Diagnosis not present

## 2018-07-22 DIAGNOSIS — S72102D Unspecified trochanteric fracture of left femur, subsequent encounter for closed fracture with routine healing: Secondary | ICD-10-CM | POA: Diagnosis not present

## 2018-07-22 DIAGNOSIS — R262 Difficulty in walking, not elsewhere classified: Secondary | ICD-10-CM | POA: Diagnosis not present

## 2018-07-22 DIAGNOSIS — G8929 Other chronic pain: Secondary | ICD-10-CM | POA: Diagnosis not present

## 2018-07-22 DIAGNOSIS — F329 Major depressive disorder, single episode, unspecified: Secondary | ICD-10-CM | POA: Insufficient documentation

## 2018-07-22 DIAGNOSIS — M6281 Muscle weakness (generalized): Secondary | ICD-10-CM | POA: Diagnosis not present

## 2018-07-22 DIAGNOSIS — S72142S Displaced intertrochanteric fracture of left femur, sequela: Secondary | ICD-10-CM | POA: Diagnosis not present

## 2018-07-22 DIAGNOSIS — F411 Generalized anxiety disorder: Secondary | ICD-10-CM | POA: Insufficient documentation

## 2018-07-22 DIAGNOSIS — Z741 Need for assistance with personal care: Secondary | ICD-10-CM | POA: Diagnosis not present

## 2018-07-22 DIAGNOSIS — M8000XS Age-related osteoporosis with current pathological fracture, unspecified site, sequela: Secondary | ICD-10-CM | POA: Diagnosis not present

## 2018-07-22 DIAGNOSIS — M5416 Radiculopathy, lumbar region: Secondary | ICD-10-CM | POA: Diagnosis not present

## 2018-07-22 DIAGNOSIS — Z4789 Encounter for other orthopedic aftercare: Secondary | ICD-10-CM | POA: Diagnosis not present

## 2018-07-22 DIAGNOSIS — Z9181 History of falling: Secondary | ICD-10-CM | POA: Diagnosis not present

## 2018-07-22 DIAGNOSIS — Z1159 Encounter for screening for other viral diseases: Secondary | ICD-10-CM | POA: Insufficient documentation

## 2018-07-22 DIAGNOSIS — K219 Gastro-esophageal reflux disease without esophagitis: Secondary | ICD-10-CM | POA: Diagnosis not present

## 2018-07-22 DIAGNOSIS — I10 Essential (primary) hypertension: Secondary | ICD-10-CM | POA: Diagnosis not present

## 2018-07-22 DIAGNOSIS — D62 Acute posthemorrhagic anemia: Secondary | ICD-10-CM | POA: Diagnosis not present

## 2018-07-22 LAB — CBC WITH DIFFERENTIAL/PLATELET
Abs Immature Granulocytes: 0.18 10*3/uL — ABNORMAL HIGH (ref 0.00–0.07)
Basophils Absolute: 0.1 10*3/uL (ref 0.0–0.1)
Basophils Relative: 0 %
Eosinophils Absolute: 0.3 10*3/uL (ref 0.0–0.5)
Eosinophils Relative: 2 %
HCT: 30 % — ABNORMAL LOW (ref 36.0–46.0)
Hemoglobin: 9.5 g/dL — ABNORMAL LOW (ref 12.0–15.0)
Immature Granulocytes: 2 %
Lymphocytes Relative: 25 %
Lymphs Abs: 2.9 10*3/uL (ref 0.7–4.0)
MCH: 32.2 pg (ref 26.0–34.0)
MCHC: 31.7 g/dL (ref 30.0–36.0)
MCV: 101.7 fL — ABNORMAL HIGH (ref 80.0–100.0)
Monocytes Absolute: 1.3 10*3/uL — ABNORMAL HIGH (ref 0.1–1.0)
Monocytes Relative: 11 %
Neutro Abs: 6.7 10*3/uL (ref 1.7–7.7)
Neutrophils Relative %: 60 %
Platelets: 341 10*3/uL (ref 150–400)
RBC: 2.95 MIL/uL — ABNORMAL LOW (ref 3.87–5.11)
RDW: 12.4 % (ref 11.5–15.5)
WBC: 11.3 10*3/uL — ABNORMAL HIGH (ref 4.0–10.5)
nRBC: 0 % (ref 0.0–0.2)

## 2018-07-22 LAB — BASIC METABOLIC PANEL
Anion gap: 9 (ref 5–15)
BUN: 22 mg/dL (ref 8–23)
CO2: 23 mmol/L (ref 22–32)
Calcium: 8.2 mg/dL — ABNORMAL LOW (ref 8.9–10.3)
Chloride: 99 mmol/L (ref 98–111)
Creatinine, Ser: 0.9 mg/dL (ref 0.44–1.00)
GFR calc Af Amer: >60 mL/min (ref >60)
GFR calc non Af Amer: >60 mL/min (ref >60)
Glucose, Bld: 117 mg/dL — ABNORMAL HIGH (ref 70–99)
Potassium: 4.2 mmol/L (ref 3.5–5.1)
Sodium: 131 mmol/L — ABNORMAL LOW (ref 135–145)

## 2018-07-23 ENCOUNTER — Encounter (HOSPITAL_COMMUNITY)
Admission: RE | Admit: 2018-07-23 | Discharge: 2018-07-23 | Disposition: A | Discharge status: Home / Self Care | Payer: Medicare Other | Source: Skilled Nursing Facility | Attending: *Deleted | Admitting: *Deleted

## 2018-07-23 LAB — SARS CORONAVIRUS 2 BY RT PCR (HOSPITAL ORDER, PERFORMED IN ~~LOC~~ HOSPITAL LAB): SARS Coronavirus 2: NEGATIVE

## 2018-07-25 ENCOUNTER — Encounter: Payer: Self-pay | Admitting: Adult Health

## 2018-07-25 ENCOUNTER — Non-Acute Institutional Stay (SKILLED_NURSING_FACILITY): Payer: Medicare Other | Admitting: Adult Health

## 2018-07-25 ENCOUNTER — Other Ambulatory Visit: Payer: Self-pay | Admitting: Adult Health

## 2018-07-25 DIAGNOSIS — M5416 Radiculopathy, lumbar region: Secondary | ICD-10-CM | POA: Diagnosis not present

## 2018-07-25 DIAGNOSIS — S72142S Displaced intertrochanteric fracture of left femur, sequela: Secondary | ICD-10-CM

## 2018-07-25 DIAGNOSIS — K219 Gastro-esophageal reflux disease without esophagitis: Secondary | ICD-10-CM | POA: Diagnosis not present

## 2018-07-25 DIAGNOSIS — G8929 Other chronic pain: Secondary | ICD-10-CM

## 2018-07-25 MED ORDER — HYDROCODONE-ACETAMINOPHEN 5-325 MG PO TABS: 1.0000 | ORAL_TABLET | Freq: Three times a day (TID) | ORAL | 0 refills | Status: DC | PRN

## 2018-07-25 NOTE — Progress Notes (Signed)
Location:   [facility] Room Number: 136 P Place of Service:  SNF (31)   CODE STATUS: Full Code  Allergies  Allergen Reactions  . Morphine And Related Other (See Comments)    Patient stated that she thinks that morphine was one of the medication that she had a bad reaction to. She can not remember the other. She said that she blacked out when she took it.  . Other     Allergic to  med, does not know name    Chief Complaint  Patient presents with  . Medical Management of Chronic Issues    Chronic radicular low back pain; gastroesophageal reflux disease without esophagitis; intertrochanteric fracture closed left sequela. Weekly follow up for the first 30 days post hospitalization; pain management.     HPI:  She is a 78 year old short term rehab patient being seen for the management of her chronic illnesses; low back pain; gerd; left hip fracture. She is presently taking vicodin 5/325 mg every 4 hours as needed for pain; she has taken this medication 1-2 times daily for her pain. She denies any changes in her appetite; no insomnia; no anxiety. There are no reports of fevers present. Will need to renew her pain medication on an 8 hours as needed through 08-01-18.   Past Medical History:  Diagnosis Date  . Anxiety   . Depression   . GERD (gastroesophageal reflux disease)   . Hypercholesteremia   . Hypertension     Past Surgical History:  Procedure Laterality Date  . BREAST REDUCTION SURGERY    . CESAREAN SECTION    . COLONOSCOPY  08/2005  . ESOPHAGOGASTRODUODENOSCOPY N/A 08/19/2013   Procedure: ESOPHAGOGASTRODUODENOSCOPY (EGD);  Surgeon: Rogene Houston, MD;  [facility];  Service: Endoscopy;  Laterality: N/A;  11:15 - rescheduled to 7:30 - Ann notified pt  . excision of submandibular gland    . HEMORRHOID SURGERY    . INTRAMEDULLARY (IM) NAIL INTERTROCHANTERIC Left 07/14/2018   Procedure: INTRAMEDULLARY (IM) NAIL, LEFT HIP;  Surgeon:  Renette Butters, MD;  Location: [facility];  Service: Orthopedics;  Laterality: Left;  . STOMACH SURGERY     years ago. ? reason  . TONSILLECTOMY      Social History   Socioeconomic History  . Marital status: Widowed    Spouse name: Not on file  . Number of children: Not on file  . Years of education: Not on file  . Highest education level: Not on file  Occupational History  . Not on file  Social Needs  . Financial resource strain: Not on file  . Food insecurity:    Worry: Not on file    Inability: Not on file  . Transportation needs:    Medical: Not on file    Non-medical: Not on file  Tobacco Use  . Smoking status: Never Smoker  . Smokeless tobacco: Never Used  Substance and Sexual Activity  . Alcohol use: No    Alcohol/week: 0.0 standard drinks  . Drug use: No  . Sexual activity: Not Currently    Birth control/protection: Post-menopausal  Lifestyle  . Physical activity:    Days per week: Not on file    Minutes per session: Not on file  . Stress: Not on file  Relationships  . Social connections:    Talks on phone: Not on file    Gets together: Not on file    Attends religious service: Not on file  Active member of club or organization: Not on file    Attends meetings of clubs or organizations: Not on file    Relationship status: Not on file  . Intimate partner violence:    Fear of current or ex partner: Not on file    Emotionally abused: Not on file    Physically abused: Not on file    Forced sexual activity: Not on file  Other Topics Concern  . Not on file  Social History Narrative  . Not on file   Family History  Problem Relation Age of Onset  . Sleep apnea Son   . Diabetes Son       VITAL SIGNS BP (!) 107/50   Pulse (!) 58   Temp 97.8 F (36.6 C)   Resp 20   Ht 4\' 11"  (1.499 m)   Wt 118 lb 9.6 oz (53.8 kg)   BMI 23.95 kg/m   Outpatient Encounter Medications as of 07/25/2018  Medication Sig  . alendronate (FOSAMAX) 70 MG tablet Take 70 mg  by mouth once a week. Take with a full glass of water on an empty stomach. Give on Sunday  . atenolol (TENORMIN) 50 MG tablet Take 50 mg by mouth daily.  . celecoxib (CELEBREX) 200 MG capsule Take 200 mg by mouth daily after breakfast.  . enoxaparin (LOVENOX) 40 MG/0.4ML injection Inject 0.4 mLs (40 mg total) into the skin daily for 30 doses. For 30 days post op for DVT prophylaxis  . ferrous sulfate 325 (65 FE) MG tablet Take 325 mg by mouth daily with breakfast.  . HYDROcodone-acetaminophen (NORCO) 5-325 MG tablet Take 1 tablet by mouth every 8 (eight) hours as needed for up to 7 days for moderate pain.  Marland Kitchen LORazepam (ATIVAN) 1 MG tablet Take 1 tablet (1 mg total) by mouth every 8 (eight) hours as needed for up to 12 days for anxiety.  . NON FORMULARY Diet type: NAS  . Nutritional Supplements (ENSURE ENLIVE PO) Take 1 Bottle by mouth 2 (two) times daily between meals.  Marland Kitchen omeprazole (PRILOSEC) 20 MG capsule Take 20 mg by mouth daily.  Oneta Rack Supplies (SKIN PREP WIPES) MISC Apply to bilateral heels every shift for prevention  . oxybutynin (DITROPAN) 5 MG tablet Take 5 mg by mouth 2 (two) times a day.  . polyethylene glycol (MIRALAX / GLYCOLAX) 17 g packet Take 17 g by mouth daily as needed for mild constipation.  . simvastatin (ZOCOR) 40 MG tablet Take 40 mg by mouth at bedtime.  Marland Kitchen tiZANidine (ZANAFLEX) 4 MG tablet Take 1 tablet (4 mg total) by mouth every 8 (eight) hours as needed for muscle spasms.  . [DISCONTINUED] diphenhydrAMINE (BENADRYL) 25 MG tablet Take 25 mg by mouth every 6 (six) hours as needed for allergies.  . [DISCONTINUED] esomeprazole (NEXIUM) 40 MG capsule Take 40 mg by mouth daily after breakfast.    No facility-administered encounter medications on file as of 07/25/2018.      SIGNIFICANT DIAGNOSTIC EXAMS  PREVIOUS:   07-13-18: left hip x-ray: Comminuted intratrochanteric left femoral fracture.  07-13-18: chest x-ray: Small sliding-type hiatal hernia.  No acute abnormality  noted.  07-13-18: ct of head: Stable exam.  No acute intracranial abnormality.  NO NEW EXAMS   LABS REVIEWED PREVIOUS:   07-13-18: wbc 9.1; hgb 11.6; hct 35.6; mvc 100.8; plt 194 glucose 105; bun 29; creat 1.18; k+ 5.4; na++ 129  07-14-18: wbc 13.7; hct 9.4; hct 28.7; mcv 99.0 plt 181 glucose 150; bun 24; creat  1.06; k+ 4.6; na++ 132; ca 7.6 07-16-18: wbc 11.8; hgb 8.7; hct 26.6; mcv 100.0 plt 147 glucose 118; bun 20; creat 0.88; k+ 4.3; na++ 136; ca 7.5   TODAY:   07-17-18: glucose 117 bun 17; creat 0.87; k+ 4.4; na++ 132 ca 7.4 07-22-18: wbc 11.3; hgb 9.5; hct 30.0; mcv 101.7; plt 341 glucose 117; bun 22; creat 0.90; k+ 4.2; na++ 131; ca 8.2   Review of Systems  Constitutional: Negative for malaise/fatigue.  Respiratory: Negative for cough and shortness of breath.   Cardiovascular: Negative for chest pain, palpitations and leg swelling.  Gastrointestinal: Negative for abdominal pain, constipation and heartburn.  Musculoskeletal: Positive for joint pain. Negative for back pain and myalgias.       Left hip pain is managed   Skin: Negative.   Neurological: Negative for dizziness.  Psychiatric/Behavioral: The patient is not nervous/anxious.    Physical Exam Constitutional:      General: She is not in acute distress.    Appearance: She is underweight. She is not diaphoretic.  Neck:     Musculoskeletal: Neck supple.     Thyroid: No thyromegaly.  Cardiovascular:     Rate and Rhythm: Normal rate and regular rhythm.     Pulses: Normal pulses.     Heart sounds: Normal heart sounds.  Pulmonary:     Effort: Pulmonary effort is normal. No respiratory distress.     Breath sounds: Normal breath sounds.  Abdominal:     General: Bowel sounds are normal. There is no distension.     Palpations: Abdomen is soft.     Tenderness: There is no abdominal tenderness.  Musculoskeletal:     Right lower leg: No edema.     Left lower leg: No edema.     Comments:  Is able to move all extremities Is  status post Left hip nail   Lymphadenopathy:     Cervical: No cervical adenopathy.  Skin:    General: Skin is warm and dry.     Comments:  Incision line without signs of infection present    Neurological:     Mental Status: She is alert and oriented to person, place, and time.  Psychiatric:        Mood and Affect: Mood normal.     ASSESSMENT/ PLAN:  TODAY:   1, intertrochanteric fracture closed left sequela: is stable will continue therapy as directed will follow up with orthopedics; will continue lovenox 40 mg daily for 30 days; zanaflex 4 mg every 8 hours as needed; and will continue vicodin 5/325 mg every 8 hours as needed for pain through 08-01-18.   2. GERD without esophagitis: is stable will continue prilosec 20 mg daily   3. Chronic radicular low back pain: is stable will continue celebrex 200 mg daily   PREVIOUS   4. Age related osteoporosis with current pathological fracture sequela: is without change: will continue fosamax 70 mg weekly   5. Essential hypertension: is stable b/p 107/50: will continue tenormin 50 mg daily   6. Mixed stress and urge urinary incontinence: is stable will continue ditropan 5 mg twice daily   7. Dyslipidemia: is stable will continue zocor 40 mg daily   8. Anxiety: is stable will continue ativan 1 mg every 8 hours as needed for 14 days.   9. Chronic constipation: is stable will continue miralax daily as needed  10. Acute blood anemia: is stable hgb 9.5 will continue iron daily       MD is  aware of resident's narcotic use and is in agreement with current plan of care. We will attempt to wean resident as apropriate   Ok Edwards NP [facility]  Contact [phone] Monday through Friday 8am- 5pm  After hours call [phone]

## 2018-07-31 ENCOUNTER — Non-Acute Institutional Stay (SKILLED_NURSING_FACILITY): Payer: Medicare Other | Admitting: Adult Health

## 2018-07-31 ENCOUNTER — Encounter: Payer: Self-pay | Admitting: Adult Health

## 2018-07-31 DIAGNOSIS — I10 Essential (primary) hypertension: Secondary | ICD-10-CM | POA: Diagnosis not present

## 2018-07-31 DIAGNOSIS — G8929 Other chronic pain: Secondary | ICD-10-CM

## 2018-07-31 DIAGNOSIS — M5416 Radiculopathy, lumbar region: Secondary | ICD-10-CM | POA: Diagnosis not present

## 2018-07-31 DIAGNOSIS — S72142S Displaced intertrochanteric fracture of left femur, sequela: Secondary | ICD-10-CM | POA: Diagnosis not present

## 2018-07-31 NOTE — Progress Notes (Signed)
Location:   [facility] Room Number: 136 P Place of Service:  SNF (31)   CODE STATUS: Full Code  Allergies  Allergen Reactions  . Morphine And Related Other (See Comments)    Patient stated that she thinks that morphine was one of the medication that she had a bad reaction to. She can not remember the other. She said that she blacked out when she took it.  . Other     Allergic to  med, does not know name    Chief Complaint  Patient presents with  . Acute Visit    Care Plan Meeting    HPI:  We have come together for her routine care plan meeting. Her BIMS 11/15. She continues to participate in therapy. Her goal is to return back home. She anticipates discharge over the next several days. She denies any uncontrolled pain; states that she has a good appetite; states that she is sleeping well at night. There are no reports of fevers.   Past Medical History:  Diagnosis Date  . Anxiety   . Depression   . GERD (gastroesophageal reflux disease)   . Hypercholesteremia   . Hypertension     Past Surgical History:  Procedure Laterality Date  . BREAST REDUCTION SURGERY    . CESAREAN SECTION    . COLONOSCOPY  08/2005  . ESOPHAGOGASTRODUODENOSCOPY N/A 08/19/2013   Procedure: ESOPHAGOGASTRODUODENOSCOPY (EGD);  Surgeon: Rogene Houston, MD;  [facility];  Service: Endoscopy;  Laterality: N/A;  11:15 - rescheduled to 7:30 - Ann notified pt  . excision of submandibular gland    . HEMORRHOID SURGERY    . INTRAMEDULLARY (IM) NAIL INTERTROCHANTERIC Left 07/14/2018   Procedure: INTRAMEDULLARY (IM) NAIL, LEFT HIP;  Surgeon: Renette Butters, MD;  Location: [facility];  Service: Orthopedics;  Laterality: Left;  . STOMACH SURGERY     years ago. ? reason  . TONSILLECTOMY      Social History   Socioeconomic History  . Marital status: Widowed    Spouse name: Not on file  . Number of children: Not on file  . Years of education: Not on file  . Highest  education level: Not on file  Occupational History  . Not on file  Social Needs  . Financial resource strain: Not on file  . Food insecurity:    Worry: Not on file    Inability: Not on file  . Transportation needs:    Medical: Not on file    Non-medical: Not on file  Tobacco Use  . Smoking status: Never Smoker  . Smokeless tobacco: Never Used  Substance and Sexual Activity  . Alcohol use: No    Alcohol/week: 0.0 standard drinks  . Drug use: No  . Sexual activity: Not Currently    Birth control/protection: Post-menopausal  Lifestyle  . Physical activity:    Days per week: Not on file    Minutes per session: Not on file  . Stress: Not on file  Relationships  . Social connections:    Talks on phone: Not on file    Gets together: Not on file    Attends religious service: Not on file    Active member of club or organization: Not on file    Attends meetings of clubs or organizations: Not on file    Relationship status: Not on file  . Intimate partner violence:    Fear of current or ex partner: Not on file    Emotionally  abused: Not on file    Physically abused: Not on file    Forced sexual activity: Not on file  Other Topics Concern  . Not on file  Social History Narrative  . Not on file   Family History  Problem Relation Age of Onset  . Sleep apnea Son   . Diabetes Son       VITAL SIGNS BP (!) 94/45   Pulse (!) 54   Temp (!) 97.3 F (36.3 C)   Resp 19   Ht 4\' 11"  (1.499 m)   Wt 100 lb 6.4 oz (45.5 kg)   BMI 20.28 kg/m   Outpatient Encounter Medications as of 07/31/2018  Medication Sig  . alendronate (FOSAMAX) 70 MG tablet Take 70 mg by mouth once a week. Take with a full glass of water on an empty stomach. Give on Sunday  . atenolol (TENORMIN) 50 MG tablet Take 50 mg by mouth daily.  Roseanne Kaufman [place]-Castor Oil (VENELEX) OINT Apply topically to buttocks every shift  . celecoxib (CELEBREX) 200 MG capsule Take 200 mg by mouth daily after breakfast.  .  enoxaparin (LOVENOX) 40 MG/0.4ML injection Inject 0.4 mLs (40 mg total) into the skin daily for 30 doses. For 30 days post op for DVT prophylaxis  . ferrous sulfate 325 (65 FE) MG tablet Take 325 mg by mouth daily with breakfast.  . HYDROcodone-acetaminophen (NORCO) 5-325 MG tablet Take 1 tablet by mouth every 8 (eight) hours as needed for up to 7 days for moderate pain.  . NON FORMULARY Diet type: NAS  . Nutritional Supplements (ENSURE ENLIVE PO) Take 1 Bottle by mouth 2 (two) times daily between meals.  Marland Kitchen omeprazole (PRILOSEC) 20 MG capsule Take 20 mg by mouth daily.  Oneta Rack Supplies (SKIN PREP WIPES) MISC Apply to bilateral heels every shift for prevention  . oxybutynin (DITROPAN) 5 MG tablet Take 5 mg by mouth 2 (two) times a day.  . polyethylene glycol (MIRALAX / GLYCOLAX) 17 g packet Take 17 g by mouth daily as needed for mild constipation.  . simvastatin (ZOCOR) 40 MG tablet Take 40 mg by mouth at bedtime.  Marland Kitchen tiZANidine (ZANAFLEX) 4 MG tablet Take 1 tablet (4 mg total) by mouth every 8 (eight) hours as needed for muscle spasms.   No facility-administered encounter medications on file as of 07/31/2018.      SIGNIFICANT DIAGNOSTIC EXAMS  PREVIOUS:   07-13-18: left hip x-ray: Comminuted intratrochanteric left femoral fracture.  07-13-18: chest x-ray: Small sliding-type hiatal hernia.  No acute abnormality noted.  07-13-18: ct of head: Stable exam.  No acute intracranial abnormality.  NO NEW EXAMS   LABS REVIEWED PREVIOUS:   07-13-18: wbc 9.1; hgb 11.6; hct 35.6; mvc 100.8; plt 194 glucose 105; bun 29; creat 1.18; k+ 5.4; na++ 129  07-14-18: wbc 13.7; hct 9.4; hct 28.7; mcv 99.0 plt 181 glucose 150; bun 24; creat 1.06; k+ 4.6; na++ 132; ca 7.6 07-16-18: wbc 11.8; hgb 8.7; hct 26.6; mcv 100.0 plt 147 glucose 118; bun 20; creat 0.88; k+ 4.3; na++ 136; ca 7.5  07-17-18: glucose 117 bun 17; creat 0.87; k+ 4.4; na++ 132 ca 7.4 07-22-18: wbc 11.3; hgb 9.5; hct 30.0; mcv 101.7; plt 341 glucose  117; bun 22; creat 0.90; k+ 4.2; na++ 131; ca 8.2   NO NEW LABS.    Review of Systems  Constitutional: Negative for malaise/fatigue.  Respiratory: Negative for cough and shortness of breath.   Cardiovascular: Negative for chest pain, palpitations and leg  swelling.  Gastrointestinal: Negative for abdominal pain, constipation and heartburn.  Musculoskeletal: Negative for back pain, joint pain and myalgias.  Skin: Negative.   Neurological: Negative for dizziness.  Psychiatric/Behavioral: The patient is not nervous/anxious.     Physical Exam Constitutional:      General: She is not in acute distress.    Appearance: She is well-developed. She is not diaphoretic.  Neck:     Musculoskeletal: Neck supple.     Thyroid: No thyromegaly.  Cardiovascular:     Rate and Rhythm: Normal rate and regular rhythm.     Pulses: Normal pulses.     Heart sounds: Normal heart sounds.  Pulmonary:     Effort: Pulmonary effort is normal. No respiratory distress.     Breath sounds: Normal breath sounds.  Abdominal:     General: Bowel sounds are normal. There is no distension.     Palpations: Abdomen is soft.     Tenderness: There is no abdominal tenderness.  Musculoskeletal:     Right lower leg: No edema.     Left lower leg: No edema.     Comments: Is able to move all extremities Status post left hip nail  Lymphadenopathy:     Cervical: No cervical adenopathy.  Skin:    General: Skin is warm and dry.  Neurological:     Mental Status: She is alert and oriented to person, place, and time.  Psychiatric:        Mood and Affect: Mood normal.      ASSESSMENT/ PLAN:  TODAY:   1, intertrochanteric fracture closed left sequela: 2. Chronic radicular low back pain: 3. Essential hypertension:    Will continue therapy as directed Will continue current medications  Will continue current plan of care Will monitor her status  Her goal remains to return back home.      MD is aware of resident's  narcotic use and is in agreement with current plan of care. We will attempt to wean resident as apropriate   Ok Edwards NP [facility]  Contact [phone] Monday through Friday 8am- 5pm  After hours call [phone]

## 2018-08-01 ENCOUNTER — Encounter: Payer: Self-pay | Admitting: Adult Health

## 2018-08-01 ENCOUNTER — Non-Acute Institutional Stay (SKILLED_NURSING_FACILITY): Payer: Medicare Other | Admitting: Adult Health

## 2018-08-01 ENCOUNTER — Other Ambulatory Visit: Payer: Self-pay | Admitting: Adult Health

## 2018-08-01 DIAGNOSIS — N3946 Mixed incontinence: Secondary | ICD-10-CM | POA: Diagnosis not present

## 2018-08-01 DIAGNOSIS — D62 Acute posthemorrhagic anemia: Secondary | ICD-10-CM | POA: Diagnosis not present

## 2018-08-01 DIAGNOSIS — S72142S Displaced intertrochanteric fracture of left femur, sequela: Secondary | ICD-10-CM | POA: Diagnosis not present

## 2018-08-01 MED ORDER — OXYBUTYNIN CHLORIDE 5 MG PO TABS: 5.0000 mg | ORAL_TABLET | Freq: Two times a day (BID) | ORAL | 0 refills | Status: DC

## 2018-08-01 MED ORDER — ALENDRONATE SODIUM 70 MG PO TABS: 70.0000 mg | ORAL_TABLET | ORAL | 0 refills | Status: DC

## 2018-08-01 MED ORDER — TIZANIDINE HCL 4 MG PO TABS: 4.0000 mg | ORAL_TABLET | Freq: Three times a day (TID) | ORAL | 0 refills | Status: DC | PRN

## 2018-08-01 MED ORDER — HYDROCODONE-ACETAMINOPHEN 5-325 MG PO TABS: 1.0000 | ORAL_TABLET | Freq: Three times a day (TID) | ORAL | 0 refills | Status: AC | PRN

## 2018-08-01 MED ORDER — SIMVASTATIN 40 MG PO TABS: 40.0000 mg | ORAL_TABLET | Freq: Every day | ORAL | 0 refills | Status: DC

## 2018-08-01 MED ORDER — ENOXAPARIN SODIUM 40 MG/0.4ML ~~LOC~~ SOLN: 40.0000 mg | SUBCUTANEOUS | 0 refills | Status: DC

## 2018-08-01 MED ORDER — CELECOXIB 200 MG PO CAPS: 200.0000 mg | ORAL_CAPSULE | Freq: Every day | ORAL | 0 refills | Status: DC

## 2018-08-01 MED ORDER — ATENOLOL 50 MG PO TABS: 50.0000 mg | ORAL_TABLET | Freq: Every day | ORAL | 0 refills | Status: DC

## 2018-08-01 NOTE — Progress Notes (Signed)
Location:   [facility] Room Number: 136 P Place of Service:  SNF (31)    CODE STATUS: Full Code  Allergies  Allergen Reactions  . Morphine And Related Other (See Comments)    Patient stated that she thinks that morphine was one of the medication that she had a bad reaction to. She can not remember the other. She said that she blacked out when she took it.  . Other     Allergic to  med, does not know name    Chief Complaint  Patient presents with  . Discharge Note    Discharging to home on 08/02/2018    HPI:  She is being discharged to home. She will complete her pt/ot on an outpatient basis. She will need a front wheel walker. She will need her prescriptions to be written and will need to follow up with her medical provider. She had been hospitalized for a left hip fracture. She was admitted to this facility for short term rehab. She has completed her therapy on an snf level and is now ready to complete her therapy on an outpatient basis.     Past Medical History:  Diagnosis Date  . Anxiety   . Depression   . GERD (gastroesophageal reflux disease)   . Hypercholesteremia   . Hypertension     Past Surgical History:  Procedure Laterality Date  . BREAST REDUCTION SURGERY    . CESAREAN SECTION    . COLONOSCOPY  08/2005  . ESOPHAGOGASTRODUODENOSCOPY N/A 08/19/2013   Procedure: ESOPHAGOGASTRODUODENOSCOPY (EGD);  Surgeon: Rogene Houston, MD;  [facility];  Service: Endoscopy;  Laterality: N/A;  11:15 - rescheduled to 7:30 - Ann notified pt  . excision of submandibular gland    . HEMORRHOID SURGERY    . INTRAMEDULLARY (IM) NAIL INTERTROCHANTERIC Left 07/14/2018   Procedure: INTRAMEDULLARY (IM) NAIL, LEFT HIP;  Surgeon: Renette Butters, MD;  Location: [facility];  Service: Orthopedics;  Laterality: Left;  . STOMACH SURGERY     years ago. ? reason  . TONSILLECTOMY      Social History   Socioeconomic History  . Marital status: Widowed    Spouse name: Not on file  . Number of children: Not on file  . Years of education: Not on file  . Highest education level: Not on file  Occupational History  . Not on file  Social Needs  . Financial resource strain: Not on file  . Food insecurity    Worry: Not on file    Inability: Not on file  . Transportation needs    Medical: Not on file    Non-medical: Not on file  Tobacco Use  . Smoking status: Never Smoker  . Smokeless tobacco: Never Used  Substance and Sexual Activity  . Alcohol use: No    Alcohol/week: 0.0 standard drinks  . Drug use: No  . Sexual activity: Not Currently    Birth control/protection: Post-menopausal  Lifestyle  . Physical activity    Days per week: Not on file    Minutes per session: Not on file  . Stress: Not on file  Relationships  . Social Herbalist on phone: Not on file    Gets together: Not on file    Attends religious service: Not on file    Active member of club or organization: Not on file    Attends meetings of clubs or organizations: Not on file    Relationship status:  Not on file  . Intimate partner violence    Fear of current or ex partner: Not on file    Emotionally abused: Not on file    Physically abused: Not on file    Forced sexual activity: Not on file  Other Topics Concern  . Not on file  Social History Narrative  . Not on file   Family History  Problem Relation Age of Onset  . Sleep apnea Son   . Diabetes Son     VITAL SIGNS BP (!) 113/54   Pulse (!) 53   Temp 98 F (36.7 C)   Resp 18   Ht 4\' 11"  (1.499 m)   Wt 100 lb 6.4 oz (45.5 kg)   BMI 20.28 kg/m   Patient's Medications  New Prescriptions   No medications on file  Previous Medications   ALENDRONATE (FOSAMAX) 70 MG TABLET    Take 70 mg by mouth once a week. Take with a full glass of water on an empty stomach. Give on Sunday   ATENOLOL (TENORMIN) 50 MG TABLET    Take 50 mg by mouth daily.   BALSAM PERU-CASTOR OIL (VENELEX) OINT    Apply  topically to buttocks every shift   CELECOXIB (CELEBREX) 200 MG CAPSULE    Take 200 mg by mouth daily after breakfast.   ENOXAPARIN (LOVENOX) 40 MG/0.4ML INJECTION    Inject 0.4 mLs (40 mg total) into the skin daily for 30 doses. For 30 days post op for DVT prophylaxis   FERROUS SULFATE 325 (65 FE) MG TABLET    Take 325 mg by mouth daily with breakfast.   HYDROCODONE-ACETAMINOPHEN (NORCO) 5-325 MG TABLET    Take 1 tablet by mouth every 8 (eight) hours as needed for up to 7 days for moderate pain.   NON FORMULARY    Diet type: NAS   NUTRITIONAL SUPPLEMENTS (ENSURE ENLIVE PO)    Take 1 Bottle by mouth 2 (two) times daily between meals.   OMEPRAZOLE (PRILOSEC) 20 MG CAPSULE    Take 20 mg by mouth daily.   OSTOMY SUPPLIES (SKIN PREP WIPES) MISC    Apply to bilateral heels every shift for prevention   OXYBUTYNIN (DITROPAN) 5 MG TABLET    Take 5 mg by mouth 2 (two) times a day.   POLYETHYLENE GLYCOL (MIRALAX / GLYCOLAX) 17 G PACKET    Take 17 g by mouth daily as needed for mild constipation.   SIMVASTATIN (ZOCOR) 40 MG TABLET    Take 40 mg by mouth at bedtime.   TIZANIDINE (ZANAFLEX) 4 MG TABLET    Take 1 tablet (4 mg total) by mouth every 8 (eight) hours as needed for muscle spasms.  Modified Medications   No medications on file  Discontinued Medications   No medications on file     SIGNIFICANT DIAGNOSTIC EXAMS   PREVIOUS:   07-13-18: left hip x-ray: Comminuted intratrochanteric left femoral fracture.  07-13-18: chest x-ray: Small sliding-type hiatal hernia.  No acute abnormality noted.  07-13-18: ct of head: Stable exam.  No acute intracranial abnormality.  NO NEW EXAMS   LABS REVIEWED PREVIOUS:   07-13-18: wbc 9.1; hgb 11.6; hct 35.6; mvc 100.8; plt 194 glucose 105; bun 29; creat 1.18; k+ 5.4; na++ 129  07-14-18: wbc 13.7; hct 9.4; hct 28.7; mcv 99.0 plt 181 glucose 150; bun 24; creat 1.06; k+ 4.6; na++ 132; ca 7.6 07-16-18: wbc 11.8; hgb 8.7; hct 26.6; mcv 100.0 plt 147 glucose 118; bun  20; creat 0.88;  k+ 4.3; na++ 136; ca 7.5  07-17-18: glucose 117 bun 17; creat 0.87; k+ 4.4; na++ 132 ca 7.4 07-22-18: wbc 11.3; hgb 9.5; hct 30.0; mcv 101.7; plt 341 glucose 117; bun 22; creat 0.90; k+ 4.2; na++ 131; ca 8.2   NO NEW LABS.   Review of Systems  Constitutional: Negative for malaise/fatigue.  Respiratory: Negative for cough and shortness of breath.   Cardiovascular: Negative for chest pain, palpitations and leg swelling.  Gastrointestinal: Negative for abdominal pain, constipation and heartburn.  Musculoskeletal: Negative for back pain, joint pain and myalgias.  Skin: Negative.   Neurological: Negative for dizziness.  Psychiatric/Behavioral: The patient is not nervous/anxious.     Physical Exam Constitutional:      General: She is not in acute distress.    Appearance: She is well-developed. She is not diaphoretic.  Neck:     Musculoskeletal: Neck supple.     Thyroid: No thyromegaly.  Cardiovascular:     Rate and Rhythm: Normal rate and regular rhythm.     Pulses: Normal pulses.     Heart sounds: Normal heart sounds.  Pulmonary:     Effort: Pulmonary effort is normal. No respiratory distress.     Breath sounds: Normal breath sounds.  Abdominal:     General: Bowel sounds are normal. There is no distension.     Palpations: Abdomen is soft.     Tenderness: There is no abdominal tenderness.  Musculoskeletal:     Right lower leg: No edema.     Left lower leg: No edema.     Comments: Is able tIs able to move all extremities Status post left hip nail   Lymphadenopathy:     Cervical: No cervical adenopathy.  Skin:    General: Skin is warm and dry.  Neurological:     Mental Status: She is alert and oriented to person, place, and time.  Psychiatric:        Mood and Affect: Mood normal.      ASSESSMENT/ PLAN:   Patient is being discharged with the following home health services:  Outpatient therapy   Patient is being discharged with the following durable medical  equipment:  Front wheel walker to allow her to maintain her current level of independence with her adls.   Patient has been advised to f/u with their PCP in 1-2 weeks to bring them up to date on their rehab stay.  Social services at facility was responsible for arranging this appointment.  Pt was provided with a 30 day supply of prescriptions for medications and refills must be obtained from their PCP.  For controlled substances, a more limited supply may be provided adequate until PCP appointment only.   A 30 day supply of her prescription medications with #10 vicodin 5/325 mg tabs have been sent to [facility]   Time spent with patient: 35 minutes: for therapy; dme and medications.    Ok Edwards NP [facility]  Contact [phone] Monday through Friday 8am- 5pm  After hours call [phone]

## 2018-08-02 ENCOUNTER — Telehealth (HOSPITAL_COMMUNITY): Payer: Self-pay | Admitting: Pulmonary Disease

## 2018-08-02 NOTE — Telephone Encounter (Signed)
08/02/18  I called patient and left a message to let her know we have also scheduled her for OT on 6/17 at 2pm and reminded othe PT appt scheduled 6/19 at 4:30.  I told if they had any questions to please call our office.

## 2018-08-06 ENCOUNTER — Telehealth (HOSPITAL_COMMUNITY): Payer: Self-pay | Admitting: Pulmonary Disease

## 2018-08-06 NOTE — Telephone Encounter (Signed)
08/06/18  gentleman said that patient had another appt in [place] and didn't want to push her... he said to cx this and we could reschedule at her 6/19 app

## 2018-08-07 ENCOUNTER — Ambulatory Visit (HOSPITAL_COMMUNITY): Payer: Medicare Other | Admitting: Specialist

## 2018-08-07 DIAGNOSIS — S72142D Displaced intertrochanteric fracture of left femur, subsequent encounter for closed fracture with routine healing: Secondary | ICD-10-CM | POA: Diagnosis not present

## 2018-08-08 ENCOUNTER — Encounter

## 2018-08-09 ENCOUNTER — Ambulatory Visit (HOSPITAL_COMMUNITY): Payer: Medicare Other | Attending: Pulmonary Disease | Admitting: Physical Therapy

## 2018-08-09 ENCOUNTER — Encounter (HOSPITAL_COMMUNITY): Payer: Self-pay | Admitting: Physical Therapy

## 2018-08-09 ENCOUNTER — Other Ambulatory Visit: Payer: Self-pay

## 2018-08-09 DIAGNOSIS — M6281 Muscle weakness (generalized): Secondary | ICD-10-CM | POA: Diagnosis not present

## 2018-08-09 DIAGNOSIS — R2689 Other abnormalities of gait and mobility: Secondary | ICD-10-CM | POA: Diagnosis not present

## 2018-08-09 NOTE — Patient Instructions (Signed)
Abduction: Clam (Eccentric) - Side-Lying    Lie on side with knees bent. Lift top knee, keeping feet together. Keep trunk steady. Slowly lower for 3-5 seconds. _10__ reps per set, _1__ sets per day, _7__ days per week. Repeat each side.    http://ecce.exer.us/65   Copyright  VHI. All rights reserved.  Quad Set    With other leg bent, foot flat, slowly tighten muscles on thigh of straight leg while counting out loud to __5__. Repeat with other leg. Repeat __10__ times. Do __1-2__ sessions per day.  http://gt2.exer.us/276   Copyright  VHI. All rights reserved.

## 2018-08-09 NOTE — Therapy (Signed)
[place], [place], [address] Phone: [phone]   Fax:  [phone]  Physical Therapy Evaluation  Patient Details  Name: [name] [name] MRN: [id number] Date of Birth: [birth year] Referring Provider (PT): Sinda Du, MD   Encounter Date: 08/09/2018  PT End of Session - 08/09/18 1735    Visit Number  1    Number of Visits  8    Date for PT Re-Evaluation  09/06/18    Authorization Type  [employer] Medicare    Authorization Time Period  08/09/18 - 09/08/18    Authorization - Visit Number  1    Authorization - Number of Visits  10    PT Start Time  1640   Patient late   PT Stop Time  7517    PT Time Calculation (min)  35 min    Equipment Utilized During Treatment  Gait belt    Activity Tolerance  Patient tolerated treatment well    Behavior During Therapy  [facility] for tasks assessed/performed       Past Medical History:  Diagnosis Date  . Anxiety   . Depression   . GERD (gastroesophageal reflux disease)   . Hypercholesteremia   . Hypertension     Past Surgical History:  Procedure Laterality Date  . BREAST REDUCTION SURGERY    . CESAREAN SECTION    . COLONOSCOPY  08/2005  . ESOPHAGOGASTRODUODENOSCOPY N/A 08/19/2013   Procedure: ESOPHAGOGASTRODUODENOSCOPY (EGD);  Surgeon: Rogene Houston, MD;  [facility];  Service: Endoscopy;  Laterality: N/A;  11:15 - rescheduled to 7:30 - Ann notified pt  . excision of submandibular gland    . HEMORRHOID SURGERY    . INTRAMEDULLARY (IM) NAIL INTERTROCHANTERIC Left 07/14/2018   Procedure: INTRAMEDULLARY (IM) NAIL, LEFT HIP;  Surgeon: Renette Butters, MD;  Location: [place];  Service: Orthopedics;  Laterality: Left;  . STOMACH SURGERY     years ago. ? reason  . TONSILLECTOMY      There were no vitals filed for this visit.   Subjective Assessment - 08/09/18 1650    Subjective  Patient reporting that she had surgery on 07/17/18 for her left leg. She denied any  tingling or numbness in that leg. Patient reported that she was in rehabilitation for 2 weeks before returning home. She stated that she did not use an AD before her surgery.    Limitations  Walking;House hold activities;Lifting;Standing    How long can you stand comfortably?  5 minutes    How long can you walk comfortably?  5 minutes    Diagnostic tests  See imaging    Patient Stated Goals  Improve strength    Currently in Pain?  No/denies         [facility] PT Assessment - 08/09/18 0001      Assessment   Medical Diagnosis  Left femur fracture; Intramedullary Nail Left hip    Referring Provider (PT)  Sinda Du, MD    Onset Date/Surgical Date  07/14/18    Next MD Visit  Not sure    Prior Therapy  None      Precautions   Precautions  None      Restrictions   Weight Bearing Restrictions  No      Balance Screen   Has the patient fallen in the past 6 months  No    Has the patient had a decrease in activity level because of a fear of falling?   Yes  Is the patient reluctant to leave their home because of a fear of falling?   No      Home Environment   Living Environment  Private residence    Living Arrangements  Children    Type of [place] to enter    [place]  One level    [place] - 2 wheels;Cane - single point      Prior Function   Level of Independence  Independent;Independent with basic ADLs    Vocation  Retired      Charity fundraiser Status  Within Functional Limits for tasks assessed      Observation/Other Assessments   Observations  Echymosis on posterior left calf    Focus on Therapeutic Outcomes (FOTO)   Perform next session      Sensation   Light Touch  Appears Intact      Functional Tests   Functional tests  Sit to Stand      Sit to Stand   Comments  5xSTS: 33.8 seconds      ROM / Strength   AROM / PROM / Strength  AROM;Strength      AROM   Overall AROM  Comments  Knee AROM WFL. Hip IR/ER WFL.     AROM Assessment Site  Hip;Knee    Right/Left Hip  Right;Left    Right/Left Knee  Right;Left      Strength   Strength Assessment Site  Hip;Knee;Ankle    Right/Left Hip  Right;Left    Right Hip Flexion  4+/5    Right Hip Extension  2+/5    Right Hip ABduction  4+/5    Left Hip Flexion  3-/5    Left Hip Extension  2-/5    Left Hip ABduction  2+/5    Right/Left Knee  Right;Left    Right Knee Flexion  5/5    Right Knee Extension  5/5    Left Knee Flexion  4+/5    Left Knee Extension  3-/5    Right/Left Ankle  Right;Left    Right Ankle Dorsiflexion  5/5    Left Ankle Dorsiflexion  5/5      Palpation   Palpation comment  Patient denied tenderness to palpation of calf bruising. No noted redness or any heat felt in calf.       Ambulation/Gait   Ambulation Distance (Feet)  68 Feet   2MWT   Assistive device  Rolling walker    Gait Pattern  Antalgic;Decreased stride length    Ambulation Surface  Level;Indoor    Gait velocity  0.17 m/s      Balance   Balance Assessed  Yes      Static Standing Balance   Static Standing - Balance Support  No upper extremity supported    Static Standing Balance -  Activities   Single Leg Stance - Right Leg;Single Leg Stance - Left Leg    Static Standing - Comment/# of Minutes  Lt. 0 seconds; Rt. 1 second                Objective measurements completed on examination: See above findings.              PT Education - 08/09/18 1735    Education Details  Examination findings, POC, and initial HEP.    Person(s) Educated  Patient    Methods  Explanation;Handout  Comprehension  Verbalized understanding       PT Short Term Goals - 08/09/18 1729      PT SHORT TERM GOAL #1   Title  Patient will report understanding and regular compliance with HEP to improve strength, balance, and overall functional mobility.    Time  2    Period  Weeks    Status  New    Target Date  08/23/18         PT Long Term Goals - 08/09/18 1729      PT LONG TERM GOAL #1   Title  Patient will demonstrate improvement of at least 1/2 MMT strength grade in all deficient muscle groups in order to improve gait mechanics and overall functional mobility.    Time  4    Period  Weeks    Status  New    Target Date  09/06/18      PT LONG TERM GOAL #2   Title  Patient will demonstrate ability to maintain single limb stance for at least 5 seconds on each LE to improve gait mechanics and safety.    Time  4    Period  Weeks    Status  New    Target Date  09/06/18      PT LONG TERM GOAL #3   Title  Patient will demonstrate improvement on 5xSTS of at least 10 seconds indicating improved functional strength and balance.    Time  4    Period  Weeks    Status  New    Target Date  09/06/18      PT LONG TERM GOAL #4   Title  Patient will demonstrate ability to ambualte at a gait speed of at least 0.8 m/s on 2MWT with LRAD indicating improved safety with household ambulation.    Time  4    Period  Weeks    Status  New    Target Date  09/06/18             Plan - 08/09/18 1736    Clinical Impression Statement  Patient is a 78 year old female who presented to outpatient physical therapy with left femur fracture with subsequent surgury to place intramedullary nail in left hip on 07/14/18. Following surgery patient was in SNF. Upon examination, paitent demonstrated decreased strength particularly in the left lower extremity and decreased mobility. Patient demonstrated decreased balance on the 5xSTS test and with SLS. Patient performed 2MWT at a very decreased gait velocity with 2 wheeled walker and with gait deviations. Patient had significant bruising on right calf, but there were no signs of infection or DVT. Educated patient on initial HEP to be performed daily. Discussed that patient would benefit from physical therapy 3 times per week however patient stated she did not want to come 3 times and wanted  to do 2 times per week instead. Patient would benefit from continued skilled physical therpay in order to address the abovementioned deficits and help patient return to her prior level of function.    Personal Factors and Comorbidities  Age;Comorbidity 2    Comorbidities  HTN, hyperlipidemia    Examination-Activity Limitations  Bed Mobility;Bend;Carry;Lift;Locomotion Level;Squat;Stairs;Stand;Transfers    Examination-Participation Restrictions  Shop;Community Activity    Stability/Clinical Decision Making  Stable/Uncomplicated    Clinical Decision Making  Low    Rehab Potential  Fair    PT Frequency  3x / week   Recommend 3x per week, but patient requesting 2x/week instead   PT Duration  4 weeks    PT Treatment/Interventions  ADLs/Self Care Home Management;Cryotherapy;Electrical Stimulation;DME Instruction;Gait training;Stair training;Functional mobility training;Therapeutic activities;Therapeutic exercise;Balance training;Neuromuscular re-education;Patient/family education;Orthotic Fit/Training;Manual techniques;Passive range of motion;Energy conservation;Taping    PT Next Visit Plan  Review Eval goals and HEP. Perform FOTO. Initiate gait training. Mat exercises for LE strengthening. Progress to balance exercises as able. Functional lower extremity strengthening as able.    PT Home Exercise Plan  Evaluation: Quad set 5'' x 10; Clams 10x each 1x/day    Consulted and Agree with Plan of Care  Patient       Patient will benefit from skilled therapeutic intervention in order to improve the following deficits and impairments:  Abnormal gait, Improper body mechanics, Decreased mobility, Decreased activity tolerance, Decreased endurance, Decreased strength, Decreased balance, Difficulty walking  Visit Diagnosis: 1. Other abnormalities of gait and mobility   2. Muscle weakness (generalized)        Problem List Patient Active Problem List   Diagnosis Date Noted  . Intertrochanteric fracture,  closed, left, sequela 07/21/2018  . Dyslipidemia 07/21/2018  . Chronic constipation 07/21/2018  . Acute blood loss anemia 07/21/2018  . Osteoporosis 07/18/2018  . Anxiety 07/18/2018  . Hip fracture ([place]) 07/13/2018  . Hypertension   . Hypercholesteremia   . Routine cervical smear 12/03/2017  . Screening for colorectal cancer 12/03/2017  . Mixed stress and urge urinary incontinence 12/03/2017  . GERD (gastroesophageal reflux disease) 04/09/2017  . Encounter for screening colonoscopy 11/22/2016  . Nausea with vomiting 08/13/2013  . Bilateral leg weakness 06/24/2013  . Posture imbalance 06/24/2013  . Chronic radicular low back pain 06/24/2013   Clarene Critchley PT, DPT 5:44 PM, 08/09/18 [place] [address] [place], [place], [address] Phone: [phone]   Fax:  [phone]  Name: [name] [name] MRN: [id number] Date of Birth: 09-22-1940

## 2018-08-12 ENCOUNTER — Ambulatory Visit (HOSPITAL_COMMUNITY): Payer: Medicare Other | Admitting: Physical Therapy

## 2018-08-12 ENCOUNTER — Other Ambulatory Visit: Payer: Self-pay

## 2018-08-12 DIAGNOSIS — R2689 Other abnormalities of gait and mobility: Secondary | ICD-10-CM | POA: Diagnosis not present

## 2018-08-12 DIAGNOSIS — M6281 Muscle weakness (generalized): Secondary | ICD-10-CM

## 2018-08-12 NOTE — Therapy (Signed)
[place], [place], [address] Phone: [phone]   Fax:  [phone]  Physical Therapy Treatment  Patient Details  Name: [name] [name] MRN: [id number] Date of Birth: 1940-06-20 Referring Provider (PT): Sinda Du, MD   Encounter Date: 08/12/2018  PT End of Session - 08/12/18 1659    Visit Number  2    Number of Visits  8    Date for PT Re-Evaluation  09/06/18    Authorization Type  [employer] Medicare    Authorization Time Period  08/09/18 - 09/08/18    Authorization - Visit Number  2    Authorization - Number of Visits  10    PT Start Time  7902    PT Stop Time  1722    PT Time Calculation (min)  44 min    Equipment Utilized During Treatment  Gait belt    Activity Tolerance  Patient tolerated treatment well    Behavior During Therapy  [facility] for tasks assessed/performed       Past Medical History:  Diagnosis Date  . Anxiety   . Depression   . GERD (gastroesophageal reflux disease)   . Hypercholesteremia   . Hypertension     Past Surgical History:  Procedure Laterality Date  . BREAST REDUCTION SURGERY    . CESAREAN SECTION    . COLONOSCOPY  08/2005  . ESOPHAGOGASTRODUODENOSCOPY N/A 08/19/2013   Procedure: ESOPHAGOGASTRODUODENOSCOPY (EGD);  Surgeon: Rogene Houston, MD;  [facility];  Service: Endoscopy;  Laterality: N/A;  11:15 - rescheduled to 7:30 - Ann notified pt  . excision of submandibular gland    . HEMORRHOID SURGERY    . INTRAMEDULLARY (IM) NAIL INTERTROCHANTERIC Left 07/14/2018   Procedure: INTRAMEDULLARY (IM) NAIL, LEFT HIP;  Surgeon: Renette Butters, MD;  Location: [place];  Service: Orthopedics;  Laterality: Left;  . STOMACH SURGERY     years ago. ? reason  . TONSILLECTOMY      There were no vitals filed for this visit.  Subjective Assessment - 08/12/18 1649    Subjective  pt states her pain was high yesterday, started hurting in Lt hip especially with movement.  No known reason  for increased pain.  Today, her pain is reduced to 3/10.    Currently in Pain?  Yes    Pain Score  3     Pain Location  Hip    Pain Orientation  Left    Pain Descriptors / Indicators  Aching         [facility] PT Assessment - 08/12/18 0001      Assessment   Medical Diagnosis  Left femur fracture; Intramedullary Nail Left hip    Referring Provider (PT)  Sinda Du, MD    Onset Date/Surgical Date  07/14/18      Observation/Other Assessments   Focus on Therapeutic Outcomes (FOTO)   FS 58%                   [facility] Adult PT Treatment/Exercise - 08/12/18 0001      Knee/Hip Exercises: Supine   Quad Sets  Both;10 reps;Limitations    Quad Sets Limitations  5" holds    Heel Slides  Left;10 reps    Bridges  Both;10 reps    Straight Leg Raises  Both;10 reps    Other Supine Knee/Hip Exercises  clams 10 reps each             PT Education - 08/12/18  [place]    Education Details  review of HEP and goals.  instructed to continue HEP daily.    Person(s) Educated  Patient    Methods  Explanation;Demonstration    Comprehension  Verbalized understanding       PT Short Term Goals - 08/12/18 1657      PT SHORT TERM GOAL #1   Title  Patient will report understanding and regular compliance with HEP to improve strength, balance, and overall functional mobility.    Time  2    Period  Weeks    Status  On-going    Target Date  08/23/18        PT Long Term Goals - 08/12/18 1657      PT LONG TERM GOAL #1   Title  Patient will demonstrate improvement of at least 1/2 MMT strength grade in all deficient muscle groups in order to improve gait mechanics and overall functional mobility.    Time  4    Period  Weeks    Status  On-going      PT LONG TERM GOAL #2   Title  Patient will demonstrate ability to maintain single limb stance for at least 5 seconds on each LE to improve gait mechanics and safety.    Time  4    Period  Weeks    Status  On-going      PT LONG TERM GOAL #3    Title  Patient will demonstrate improvement on 5xSTS of at least 10 seconds indicating improved functional strength and balance.    Time  4    Period  Weeks    Status  On-going      PT LONG TERM GOAL #4   Title  Patient will demonstrate ability to ambualte at a gait speed of at least 0.8 m/s on 2MWT with LRAD indicating improved safety with household ambulation.    Time  4    Period  Weeks    Status  On-going            Plan - 08/12/18 1717    Clinical Impression Statement  Began session with ambulation, completing 175 feet with SPC in 10 minutes.  Pt required cues to reduce antalgia with noted bowing of Rt knee.  Pt completed FOTO, taking increased time as she explained all her answers.  Able to review goals and HEP in remaining time left.  Added additional strengthening exercises in supine today.  Able to complete all without complaints    Personal Factors and Comorbidities  Age;Comorbidity 2    Comorbidities  HTN, hyperlipidemia    Examination-Activity Limitations  Bed Mobility;Bend;Carry;Lift;Locomotion Level;Squat;Stairs;Stand;Transfers    Examination-Participation Restrictions  Shop;Community Activity    Stability/Clinical Decision Making  Stable/Uncomplicated    Rehab Potential  Fair    PT Frequency  3x / week   Recommend 3x per week, but patient requesting 2x/week instead   PT Duration  4 weeks    PT Treatment/Interventions  ADLs/Self Care Home Management;Cryotherapy;Electrical Stimulation;DME Instruction;Gait training;Stair training;Functional mobility training;Therapeutic activities;Therapeutic exercise;Balance training;Neuromuscular re-education;Patient/family education;Orthotic Fit/Training;Manual techniques;Passive range of motion;Energy conservation;Taping    PT Next Visit Plan  continue to progress gait training. Mat exercises for LE strengthening. Progress to balance exercises as able. Functional lower extremity strengthening as able.    PT Home Exercise Plan   Evaluation: Quad set 5'' x 10; Clams 10x each 1x/day    Consulted and Agree with Plan of Care  Patient       Patient will benefit  from skilled therapeutic intervention in order to improve the following deficits and impairments:  Abnormal gait, Improper body mechanics, Decreased mobility, Decreased activity tolerance, Decreased endurance, Decreased strength, Decreased balance, Difficulty walking  Visit Diagnosis: 1. Muscle weakness (generalized)   2. Other abnormalities of gait and mobility        Problem List Patient Active Problem List   Diagnosis Date Noted  . Intertrochanteric fracture, closed, left, sequela 07/21/2018  . Dyslipidemia 07/21/2018  . Chronic constipation 07/21/2018  . Acute blood loss anemia 07/21/2018  . Osteoporosis 07/18/2018  . Anxiety 07/18/2018  . Hip fracture ([place]) 07/13/2018  . Hypertension   . Hypercholesteremia   . Routine cervical smear 12/03/2017  . Screening for colorectal cancer 12/03/2017  . Mixed stress and urge urinary incontinence 12/03/2017  . GERD (gastroesophageal reflux disease) 04/09/2017  . Encounter for screening colonoscopy 11/22/2016  . Nausea with vomiting 08/13/2013  . Bilateral leg weakness 06/24/2013  . Posture imbalance 06/24/2013  . Chronic radicular low back pain 06/24/2013   Teena Irani, PTA/CLT [phone]  Teena Irani 08/12/2018, 5:22 PM  [place] [address] [place], [place], [address] Phone: [phone]   Fax:  [phone]  Name: [name] [name] MRN: [id number] Date of Birth: 10-16-1940

## 2018-08-15 ENCOUNTER — Ambulatory Visit (HOSPITAL_COMMUNITY): Payer: Medicare Other

## 2018-08-15 ENCOUNTER — Encounter (HOSPITAL_COMMUNITY): Payer: Self-pay

## 2018-08-15 ENCOUNTER — Other Ambulatory Visit: Payer: Self-pay

## 2018-08-15 DIAGNOSIS — M6281 Muscle weakness (generalized): Secondary | ICD-10-CM | POA: Diagnosis not present

## 2018-08-15 DIAGNOSIS — R2689 Other abnormalities of gait and mobility: Secondary | ICD-10-CM | POA: Diagnosis not present

## 2018-08-15 NOTE — Therapy (Signed)
[place], [place], [address] Phone: [phone]   Fax:  [phone]  Physical Therapy Treatment  Patient Details  Name: [name] [name] MRN: [id number] Date of Birth: [birth year] Referring Provider (PT): Sinda Du, MD   Encounter Date: 08/15/2018  PT End of Session - 08/15/18 1635    Visit Number  3    Number of Visits  8    Date for PT Re-Evaluation  09/06/18    Authorization Type  UHC Medicare    Authorization Time Period  08/09/18 - 09/08/18    Authorization - Visit Number  3    Authorization - Number of Visits  10    PT Start Time  6962   patient late   PT Stop Time  1713    PT Time Calculation (min)  38 min    Equipment Utilized During Treatment  Gait belt    Activity Tolerance  Patient tolerated treatment well    Behavior During Therapy  [facility] for tasks assessed/performed       Past Medical History:  Diagnosis Date  . Anxiety   . Depression   . GERD (gastroesophageal reflux disease)   . Hypercholesteremia   . Hypertension     Past Surgical History:  Procedure Laterality Date  . BREAST REDUCTION SURGERY    . CESAREAN SECTION    . COLONOSCOPY  08/2005  . ESOPHAGOGASTRODUODENOSCOPY N/A 08/19/2013   Procedure: ESOPHAGOGASTRODUODENOSCOPY (EGD);  Surgeon: Rogene Houston, MD;  [facility];  Service: Endoscopy;  Laterality: N/A;  11:15 - rescheduled to 7:30 - Ann notified pt  . excision of submandibular gland    . HEMORRHOID SURGERY    . INTRAMEDULLARY (IM) NAIL INTERTROCHANTERIC Left 07/14/2018   Procedure: INTRAMEDULLARY (IM) NAIL, LEFT HIP;  Surgeon: Renette Butters, MD;  Location: [facility];  Service: Orthopedics;  Laterality: Left;  . STOMACH SURGERY     years ago. ? reason  . TONSILLECTOMY      There were no vitals filed for this visit.  Subjective Assessment - 08/15/18 1702    Subjective  Patient reports she is doing well and is not having pain today. She reports she is doing a  lot of walking at home and is working on her exercises.    Limitations  Walking;House hold activities;Lifting;Standing    Currently in Pain?  No/denies       [facility] Adult PT Treatment/Exercise - 08/15/18 0001      Ambulation/Gait   Ambulation/Gait  Yes    Ambulation/Gait Assistance  6: Modified independent (Device/Increase time)    Ambulation Distance (Feet)  60 Feet    Assistive device  Straight cane    Gait Comments  cues for sequencing SPC with Lt LE for 2 step pattern      Knee/Hip Exercises: Standing   Hip Abduction  Both;1 set;10 reps;Knee straight    Hip Extension  Both;1 set;10 reps;Knee straight      Knee/Hip Exercises: Seated   Sit to Sand  2 sets;10 reps;without UE support      Knee/Hip Exercises: Supine   Quad Sets  Left;1 set;20 reps    Quad Sets Limitations  5" holds    [employer]  Left;1 set;20 reps    Heel Slides  Left;1 set;20 reps    Bridges  Both;1 set;20 reps    Straight Leg Raises  Both;1 set;20 reps      Knee/Hip Exercises: Sidelying   Clams  1x 15 reps bil LE        PT Education - 08/15/18 1702    Education Details  Educated on exercises throughout and updated HEP.    Person(s) Educated  Patient    Methods  Explanation;Demonstration;Handout    Comprehension  Verbalized understanding;Returned demonstration       PT Short Term Goals - 08/12/18 1657      PT SHORT TERM GOAL #1   Title  Patient will report understanding and regular compliance with HEP to improve strength, balance, and overall functional mobility.    Time  2    Period  Weeks    Status  On-going    Target Date  08/23/18        PT Long Term Goals - 08/12/18 1657      PT LONG TERM GOAL #1   Title  Patient will demonstrate improvement of at least 1/2 MMT strength grade in all deficient muscle groups in order to improve gait mechanics and overall functional mobility.    Time  4    Period  Weeks    Status  On-going      PT LONG TERM GOAL #2   Title  Patient will  demonstrate ability to maintain single limb stance for at least 5 seconds on each LE to improve gait mechanics and safety.    Time  4    Period  Weeks    Status  On-going      PT LONG TERM GOAL #3   Title  Patient will demonstrate improvement on 5xSTS of at least 10 seconds indicating improved functional strength and balance.    Time  4    Period  Weeks    Status  On-going      PT LONG TERM GOAL #4   Title  Patient will demonstrate ability to ambualte at a gait speed of at least 0.8 m/s on 2MWT with LRAD indicating improved safety with household ambulation.    Time  4    Period  Weeks    Status  On-going         Plan - 08/15/18 1659    Clinical Impression Statement  Continued with current plan and focus on ROM exercises and LE strengthening. Progressed patient to standing exercises with sit to stands and hip ext/abd. She required verbal and tactile cues to achieve proper form with hip ext/abd in standing and deter trunk compensation. Patient denied pain in her hip throughout and was educated on updated HEP to advance home program. She will continue to benefit from skilled PT interventions to address impairments and progress towards goals.    Personal Factors and Comorbidities  Age;Comorbidity 2    Comorbidities  HTN, hyperlipidemia    Examination-Activity Limitations  Bed Mobility;Bend;Carry;Lift;Locomotion Level;Squat;Stairs;Stand;Transfers    Examination-Participation Restrictions  Shop;Community Activity    Stability/Clinical Decision Making  Stable/Uncomplicated    Rehab Potential  Fair    PT Frequency  3x / week   Recommend 3x per week, but patient requesting 2x/week instead   PT Duration  4 weeks    PT Treatment/Interventions  ADLs/Self Care Home Management;Cryotherapy;Electrical Stimulation;DME Instruction;Gait training;Stair training;Functional mobility training;Therapeutic activities;Therapeutic exercise;Balance training;Neuromuscular re-education;Patient/family  education;Orthotic Fit/Training;Manual techniques;Passive range of motion;Energy conservation;Taping    PT Next Visit Plan  continue to progress gait training. Progress to standing exercises and begin balance exercises as able. Functional lower extremity strengthening as able.    PT Home Exercise Plan  Evaluation: Quad set 5'' x 10; Clams 10x each 1x/day;  08/15/18 - sit<>stand    Consulted and Agree with Plan of Care  Patient       Patient will benefit from skilled therapeutic intervention in order to improve the following deficits and impairments:  Abnormal gait, Improper body mechanics, Decreased mobility, Decreased activity tolerance, Decreased endurance, Decreased strength, Decreased balance, Difficulty walking  Visit Diagnosis: 1. Muscle weakness (generalized)   2. Other abnormalities of gait and mobility        Problem List Patient Active Problem List   Diagnosis Date Noted  . Intertrochanteric fracture, closed, left, sequela 07/21/2018  . Dyslipidemia 07/21/2018  . Chronic constipation 07/21/2018  . Acute blood loss anemia 07/21/2018  . Osteoporosis 07/18/2018  . Anxiety 07/18/2018  . Hip fracture ([place]) 07/13/2018  . Hypertension   . Hypercholesteremia   . Routine cervical smear 12/03/2017  . Screening for colorectal cancer 12/03/2017  . Mixed stress and urge urinary incontinence 12/03/2017  . GERD (gastroesophageal reflux disease) 04/09/2017  . Encounter for screening colonoscopy 11/22/2016  . Nausea with vomiting 08/13/2013  . Bilateral leg weakness 06/24/2013  . Posture imbalance 06/24/2013  . Chronic radicular low back pain 06/24/2013    Kipp Brood, PT, DPT, [facility] Physical Therapist with [facility]  08/15/2018 5:29 PM    [place], [place], [address] Phone: [phone]   Fax:  [phone]  Name: [name] [name] MRN: [id number] Date of Birth: 31-Aug-1940

## 2018-08-15 NOTE — Patient Instructions (Signed)
Sit to Stand with Arms Crossed reps: 10 sets: 3 daily: 1 weekly: 7   Exercise image step 1   Exercise image step 2   Exercise image step 3 Setup  Begin sitting upright in a chair. Movement  Cross your arms on your chest and lean your torso forward, then press into your feet to stand up. Slowly sit back down and repeat. Tip  Make sure to maintain your balance and try to keep your weight evenly distributed between both legs. Do not lock your knees when you are standing.

## 2018-08-20 ENCOUNTER — Telehealth (HOSPITAL_COMMUNITY): Payer: Self-pay

## 2018-08-20 ENCOUNTER — Ambulatory Visit (HOSPITAL_COMMUNITY): Payer: Medicare Other

## 2018-08-20 DIAGNOSIS — E785 Hyperlipidemia, unspecified: Secondary | ICD-10-CM | POA: Diagnosis not present

## 2018-08-20 DIAGNOSIS — S72009A Fracture of unspecified part of neck of unspecified femur, initial encounter for closed fracture: Secondary | ICD-10-CM | POA: Diagnosis not present

## 2018-08-20 DIAGNOSIS — I10 Essential (primary) hypertension: Secondary | ICD-10-CM | POA: Diagnosis not present

## 2018-08-20 DIAGNOSIS — M545 Low back pain: Secondary | ICD-10-CM | POA: Diagnosis not present

## 2018-08-20 NOTE — Telephone Encounter (Signed)
She came by to cx  Appointment at 4pm today. She does not feel well today- her son was driving her. She will come back on Thursday.

## 2018-08-22 ENCOUNTER — Ambulatory Visit (HOSPITAL_COMMUNITY): Payer: Medicare Other | Attending: Pulmonary Disease

## 2018-08-22 ENCOUNTER — Other Ambulatory Visit: Payer: Self-pay

## 2018-08-22 ENCOUNTER — Encounter (HOSPITAL_COMMUNITY): Payer: Self-pay

## 2018-08-22 DIAGNOSIS — R2689 Other abnormalities of gait and mobility: Secondary | ICD-10-CM | POA: Insufficient documentation

## 2018-08-22 DIAGNOSIS — R29898 Other symptoms and signs involving the musculoskeletal system: Secondary | ICD-10-CM | POA: Insufficient documentation

## 2018-08-22 DIAGNOSIS — M6281 Muscle weakness (generalized): Secondary | ICD-10-CM | POA: Diagnosis not present

## 2018-08-22 NOTE — Therapy (Signed)
[place], [place], [address] Phone: [phone]   Fax:  [phone]  Physical Therapy Treatment  Patient Details  Name: [name] [name] MRN: [id number] Date of Birth: [birth year] Referring Provider (PT): Sinda Du, MD   Encounter Date: 08/22/2018  PT End of Session - 08/22/18 1627    Visit Number  4    Number of Visits  8    Date for PT Re-Evaluation  09/06/18    Authorization Type  UHC Medicare    Authorization Time Period  08/09/18 - 09/08/18    Authorization - Visit Number  4    Authorization - Number of Visits  10    PT Start Time  7858    PT Stop Time  1702   late for apt   PT Time Calculation (min)  40 min    Equipment Utilized During Treatment  Gait belt    Activity Tolerance  Patient tolerated treatment well    Behavior During Therapy  [facility] for tasks assessed/performed       Past Medical History:  Diagnosis Date  . Anxiety   . Depression   . GERD (gastroesophageal reflux disease)   . Hypercholesteremia   . Hypertension     Past Surgical History:  Procedure Laterality Date  . BREAST REDUCTION SURGERY    . CESAREAN SECTION    . COLONOSCOPY  08/2005  . ESOPHAGOGASTRODUODENOSCOPY N/A 08/19/2013   Procedure: ESOPHAGOGASTRODUODENOSCOPY (EGD);  Surgeon: Rogene Houston, MD;  [facility];  Service: Endoscopy;  Laterality: N/A;  11:15 - rescheduled to 7:30 - Ann notified pt  . excision of submandibular gland    . HEMORRHOID SURGERY    . INTRAMEDULLARY (IM) NAIL INTERTROCHANTERIC Left 07/14/2018   Procedure: INTRAMEDULLARY (IM) NAIL, LEFT HIP;  Surgeon: Renette Butters, MD;  Location: [facility];  Service: Orthopedics;  Laterality: Left;  . STOMACH SURGERY     years ago. ? reason  . TONSILLECTOMY      There were no vitals filed for this visit.  Subjective Assessment - 08/22/18 1616    Subjective  Pt late for apt, had dentist apt earlier today.  Reports she has been compliant with HEP  daily.  No reoprts of pain today.    Currently in Pain?  No/denies                       [facility] Adult PT Treatment/Exercise - 08/22/18 0001      Ambulation/Gait   Ambulation/Gait  Yes    Ambulation/Gait Assistance  6: Modified independent (Device/Increase time)    Ambulation Distance (Feet)  60 Feet    Assistive device  Straight cane    Gait Comments  cues for sequencing SPC with Lt LE for 2 step pattern      Knee/Hip Exercises: Standing   Heel Raises  15 reps    Heel Raises Limitations  Toe raises    Hip Abduction  Both;10 reps;Knee straight;2 sets    Abduction Limitations  cueing to reduce trunk lean and ER; 2nd set RTB    Hip Extension  Both;10 reps;Knee straight;2 sets    Lateral Step Up  Left;10 reps;Hand Hold: 2;Step Height: 4"    Forward Step Up  Both;10 reps;Hand Hold: 1;Step Height: 4"    Functional Squat  10 reps    Functional Squat Limitations  chair taps    Other Standing Knee Exercises  tandem stance 2x 30"; stable on  solid surface so progressed to foam    Other Standing Knee Exercises  sidestep 1RT no resistance      Knee/Hip Exercises: Seated   Sit to Sand  2 sets;10 reps;without UE support               PT Short Term Goals - 08/12/18 1657      PT SHORT TERM GOAL #1   Title  Patient will report understanding and regular compliance with HEP to improve strength, balance, and overall functional mobility.    Time  2    Period  Weeks    Status  On-going    Target Date  08/23/18        PT Long Term Goals - 08/12/18 1657      PT LONG TERM GOAL #1   Title  Patient will demonstrate improvement of at least 1/2 MMT strength grade in all deficient muscle groups in order to improve gait mechanics and overall functional mobility.    Time  4    Period  Weeks    Status  On-going      PT LONG TERM GOAL #2   Title  Patient will demonstrate ability to maintain single limb stance for at least 5 seconds on each LE to improve gait mechanics and  safety.    Time  4    Period  Weeks    Status  On-going      PT LONG TERM GOAL #3   Title  Patient will demonstrate improvement on 5xSTS of at least 10 seconds indicating improved functional strength and balance.    Time  4    Period  Weeks    Status  On-going      PT LONG TERM GOAL #4   Title  Patient will demonstrate ability to ambualte at a gait speed of at least 0.8 m/s on 2MWT with LRAD indicating improved safety with household ambulation.    Time  4    Period  Weeks    Status  On-going            Plan - 08/22/18 1711    Clinical Impression Statement  Progressed to closed chain LE strengthening exercises with additional forward/lateral step up, minisquats and additional resistance with hip abd/ext and balance training.  Pt required verbal and tactile cueing to improve form and overall posture during hip exercises, tendency to lateral bend at trunk and ER hips.  Pt stable with tandem stance on solid surface so progressed to foam with min A and cueing for posture and weight bearing to assist.  Also added side step to improve SLS and glut med strengthenig, completed without resistance to improve form with task. Pt with tendency to talk with subjects not involving, required frequent cueing to stay on task.    Personal Factors and Comorbidities  Age;Comorbidity 2    Comorbidities  HTN, hyperlipidemia    Examination-Activity Limitations  Bed Mobility;Bend;Carry;Lift;Locomotion Level;Squat;Stairs;Stand;Transfers    Examination-Participation Restrictions  Shop;Community Activity    Stability/Clinical Decision Making  Stable/Uncomplicated    Clinical Decision Making  Low    Rehab Potential  Fair    PT Frequency  3x / week   Recommend 3x per week, but patient requesting 2x/week instead   PT Duration  4 weeks    PT Treatment/Interventions  ADLs/Self Care Home Management;Cryotherapy;Electrical Stimulation;DME Instruction;Gait training;Stair training;Functional mobility  training;Therapeutic activities;Therapeutic exercise;Balance training;Neuromuscular re-education;Patient/family education;Orthotic Fit/Training;Manual techniques;Passive range of motion;Energy conservation;Taping    PT Next Visit Plan  continue to  progress gait training. Progress to standing exercises and begin balance exercises as able. Functional lower extremity strengthening as able.    PT Home Exercise Plan  Evaluation: Quad set 5'' x 10; Clams 10x each 1x/day; 08/15/18 - sit<>stand       Patient will benefit from skilled therapeutic intervention in order to improve the following deficits and impairments:  Abnormal gait, Improper body mechanics, Decreased mobility, Decreased activity tolerance, Decreased endurance, Decreased strength, Decreased balance, Difficulty walking  Visit Diagnosis: 1. Other abnormalities of gait and mobility   2. Muscle weakness (generalized)        Problem List Patient Active Problem List   Diagnosis Date Noted  . Intertrochanteric fracture, closed, left, sequela 07/21/2018  . Dyslipidemia 07/21/2018  . Chronic constipation 07/21/2018  . Acute blood loss anemia 07/21/2018  . Osteoporosis 07/18/2018  . Anxiety 07/18/2018  . Hip fracture ([place]) 07/13/2018  . Hypertension   . Hypercholesteremia   . Routine cervical smear 12/03/2017  . Screening for colorectal cancer 12/03/2017  . Mixed stress and urge urinary incontinence 12/03/2017  . GERD (gastroesophageal reflux disease) 04/09/2017  . Encounter for screening colonoscopy 11/22/2016  . Nausea with vomiting 08/13/2013  . Bilateral leg weakness 06/24/2013  . Posture imbalance 06/24/2013  . Chronic radicular low back pain 06/24/2013   Ihor Austin, LPTA; CBIS [phone]  Aldona Lento 08/22/2018, 5:20 PM  [place], [place], [address] Phone: [phone]   Fax:  [phone]  Name: SHALONDRA WUNSCHEL MRN: [id number] Date of  Birth: 1940-08-06

## 2018-08-28 ENCOUNTER — Telehealth (HOSPITAL_COMMUNITY): Payer: Self-pay | Admitting: Pulmonary Disease

## 2018-08-28 NOTE — Telephone Encounter (Signed)
08/28/18  I offered appts this afternoon, 7/8, but she said that her son couldn't bring her he had things to do so I told her we would just have to cancel the 7/9 appt since Apolonio Schneiders wouldn't be here stil

## 2018-08-28 NOTE — Telephone Encounter (Signed)
08/28/18  left a message to offer appt this afternoon with Myriam Jacobson at 315 or Amy at 430 and to move the appt on Friday also with Myriam Jacobson at [phone]

## 2018-08-29 ENCOUNTER — Ambulatory Visit (HOSPITAL_COMMUNITY): Payer: Medicare Other | Admitting: Physical Therapy

## 2018-08-30 ENCOUNTER — Other Ambulatory Visit: Payer: Self-pay

## 2018-08-30 ENCOUNTER — Ambulatory Visit (HOSPITAL_COMMUNITY): Payer: Medicare Other

## 2018-08-30 ENCOUNTER — Encounter (HOSPITAL_COMMUNITY): Payer: Self-pay

## 2018-08-30 DIAGNOSIS — R2689 Other abnormalities of gait and mobility: Secondary | ICD-10-CM | POA: Diagnosis not present

## 2018-08-30 DIAGNOSIS — M6281 Muscle weakness (generalized): Secondary | ICD-10-CM

## 2018-08-30 DIAGNOSIS — R29898 Other symptoms and signs involving the musculoskeletal system: Secondary | ICD-10-CM | POA: Diagnosis not present

## 2018-08-30 NOTE — Therapy (Signed)
[place], [place], [address] Phone: [phone]   Fax:  [phone]  Physical Therapy Treatment  Patient Details  Name: [name] [name] MRN: [id number] Date of Birth: [birth year] Referring Provider (PT): Sinda Du, MD   Encounter Date: 08/30/2018  PT End of Session - 08/30/18 1433    Visit Number  5    Number of Visits  8    Date for PT Re-Evaluation  09/06/18    Authorization Type  [employer]HC Medicare    Authorization Time Period  08/09/18 - 09/08/18    Authorization - Visit Number  5    Authorization - Number of Visits  10    PT Start Time  6767    PT Stop Time  1503    PT Time Calculation (min)  41 min    Equipment Utilized During Treatment  Gait belt    Activity Tolerance  Patient tolerated treatment well    Behavior During Therapy  [facility] for tasks assessed/performed       Past Medical History:  Diagnosis Date  . Anxiety   . Depression   . GERD (gastroesophageal reflux disease)   . Hypercholesteremia   . Hypertension     Past Surgical History:  Procedure Laterality Date  . BREAST REDUCTION SURGERY    . CESAREAN SECTION    . COLONOSCOPY  08/2005  . ESOPHAGOGASTRODUODENOSCOPY N/A 08/19/2013   Procedure: ESOPHAGOGASTRODUODENOSCOPY (EGD);  Surgeon: Rogene Houston, MD;  [facility];  Service: Endoscopy;  Laterality: N/A;  11:15 - rescheduled to 7:30 - Ann notified pt  . excision of submandibular gland    . HEMORRHOID SURGERY    . INTRAMEDULLARY (IM) NAIL INTERTROCHANTERIC Left 07/14/2018   Procedure: INTRAMEDULLARY (IM) NAIL, LEFT HIP;  Surgeon: Renette Butters, MD;  Location: [facility];  Service: Orthopedics;  Laterality: Left;  . STOMACH SURGERY     years ago. ? reason  . TONSILLECTOMY      There were no vitals filed for this visit.  Subjective Assessment - 08/30/18 1429    Subjective  Pt late for apt, stated her son was running behind today.  No reports of pain of today.    Patient Stated  Goals  Improve strength    Currently in Pain?  No/denies         [facility] PT Assessment - 08/30/18 0001      Assessment   Medical Diagnosis  Left femur fracture; Intramedullary Nail Left hip    Referring Provider (PT)  Sinda Du, MD    Onset Date/Surgical Date  07/14/18    Next MD Visit  09/04/18   Apt with surgeon   Prior Therapy  None                   [facility] Adult PT Treatment/Exercise - 08/30/18 0001      Ambulation/Gait   Ambulation/Gait  Yes    Ambulation/Gait Assistance  6: Modified independent (Device/Increase time)    Ambulation Distance (Feet)  226 Feet    Assistive device  Straight cane    Gait Comments  improved sequence      Knee/Hip Exercises: Standing   Heel Raises  15 reps    Heel Raises Limitations  Toe raises incline slope    Lateral Step Up  Left;15 reps;Hand Hold: 1;Step Height: 4"    Forward Step Up  Both;15 reps;Hand Hold: 1;Step Height: 6"    Functional Squat  10 reps  Functional Squat Limitations  chair taps    SLS with Vectors  3x5" holds 1HR A BLE    Other Standing Knee Exercises  tandem stance 2x 30"; stable on solid surface so progressed to foam    Other Standing Knee Exercises  sidestep 2RT no resistance, cued to reduce ER Rt LE               PT Short Term Goals - 08/12/18 1657      PT SHORT TERM GOAL #1   Title  Patient will report understanding and regular compliance with HEP to improve strength, balance, and overall functional mobility.    Time  2    Period  Weeks    Status  On-going    Target Date  08/23/18        PT Long Term Goals - 08/12/18 1657      PT LONG TERM GOAL #1   Title  Patient will demonstrate improvement of at least 1/2 MMT strength grade in all deficient muscle groups in order to improve gait mechanics and overall functional mobility.    Time  4    Period  Weeks    Status  On-going      PT LONG TERM GOAL #2   Title  Patient will demonstrate ability to maintain single limb stance for at  least 5 seconds on each LE to improve gait mechanics and safety.    Time  4    Period  Weeks    Status  On-going      PT LONG TERM GOAL #3   Title  Patient will demonstrate improvement on 5xSTS of at least 10 seconds indicating improved functional strength and balance.    Time  4    Period  Weeks    Status  On-going      PT LONG TERM GOAL #4   Title  Patient will demonstrate ability to ambualte at a gait speed of at least 0.8 m/s on 2MWT with LRAD indicating improved safety with household ambulation.    Time  4    Period  Weeks    Status  On-going            Plan - 08/30/18 1600    Clinical Impression Statement  Continued with established POC for LE strengthening and balalnce training.  Pt late for apt today, unable to complete full POC.  Able to increase height with step up training for functional strengthening.  Added vector stance with cueing for form for gluteal strengthening and SLS, pt required 1 HHA for proper form with new task.  Improved sequence with SPC gait training, no LOB episodes just min guard for safety.  Pt continues to require cueing to stay on task as tendency to tell stories during session.  No reports of pain through session, was limited by fatigue.  Pt encouraged to arrive on time next session, reviewed next apt date and time with verbalized understanding.    Personal Factors and Comorbidities  Age;Comorbidity 2    Comorbidities  HTN, hyperlipidemia    Examination-Activity Limitations  Bed Mobility;Bend;Carry;Lift;Locomotion Level;Squat;Stairs;Stand;Transfers    Examination-Participation Restrictions  Shop;Community Activity    Stability/Clinical Decision Making  Stable/Uncomplicated    Clinical Decision Making  Low    Rehab Potential  Fair    PT Frequency  3x / week   Recommend 3x per week, but patient requesting 2x/week instead   PT Duration  4 weeks    PT Treatment/Interventions  ADLs/Self Care Home Management;Cryotherapy;Dealer  Stimulation;DME  Instruction;Gait training;Stair training;Functional mobility training;Therapeutic activities;Therapeutic exercise;Balance training;Neuromuscular re-education;Patient/family education;Orthotic Fit/Training;Manual techniques;Passive range of motion;Energy conservation;Taping    PT Next Visit Plan  Review goals prior MD apt with surgeon on 09/04/18.  Continue to improve sequence with SPC, progress functional strengthening exercises and balance training.    PT Home Exercise Plan  Evaluation: Quad set 5'' x 10; Clams 10x each 1x/day; 08/15/18 - sit<>stand       Patient will benefit from skilled therapeutic intervention in order to improve the following deficits and impairments:  Abnormal gait, Improper body mechanics, Decreased mobility, Decreased activity tolerance, Decreased endurance, Decreased strength, Decreased balance, Difficulty walking  Visit Diagnosis: 1. Other abnormalities of gait and mobility   2. Muscle weakness (generalized)        Problem List Patient Active Problem List   Diagnosis Date Noted  . Intertrochanteric fracture, closed, left, sequela 07/21/2018  . Dyslipidemia 07/21/2018  . Chronic constipation 07/21/2018  . Acute blood loss anemia 07/21/2018  . Osteoporosis 07/18/2018  . Anxiety 07/18/2018  . Hip fracture ([place]) 07/13/2018  . Hypertension   . Hypercholesteremia   . Routine cervical smear 12/03/2017  . Screening for colorectal cancer 12/03/2017  . Mixed stress and urge urinary incontinence 12/03/2017  . GERD (gastroesophageal reflux disease) 04/09/2017  . Encounter for screening colonoscopy 11/22/2016  . Nausea with vomiting 08/13/2013  . Bilateral leg weakness 06/24/2013  . Posture imbalance 06/24/2013  . Chronic radicular low back pain 06/24/2013   Ihor Austin, LPTA; CBIS [phone]  Aldona Lento 08/30/2018, 4:06 PM  [place], [place], [address] Phone: [phone]   Fax:   [phone]  Name: [name] [name] MRN: [id number] Date of Birth: [birth year]

## 2018-09-02 ENCOUNTER — Ambulatory Visit (HOSPITAL_COMMUNITY): Payer: Medicare Other | Admitting: Physical Therapy

## 2018-09-02 ENCOUNTER — Other Ambulatory Visit: Payer: Self-pay

## 2018-09-02 DIAGNOSIS — M6281 Muscle weakness (generalized): Secondary | ICD-10-CM | POA: Diagnosis not present

## 2018-09-02 DIAGNOSIS — R2689 Other abnormalities of gait and mobility: Secondary | ICD-10-CM

## 2018-09-02 DIAGNOSIS — R29898 Other symptoms and signs involving the musculoskeletal system: Secondary | ICD-10-CM | POA: Diagnosis not present

## 2018-09-02 NOTE — Therapy (Signed)
[place], [place], [address] Phone: [phone]   Fax:  [phone]  Physical Therapy Treatment  Patient Details  Name: [name] [name] MRN: [id number] Date of Birth: April 09, 1940 Referring Provider (PT): Sinda Du, MD   Encounter Date: 09/02/2018   2MWT with RW: 207 feet 4 seconds SLS 5xSTS in 18.1 seconds  PT End of Session - 09/02/18 1644    Visit Number  6    Number of Visits  8    Date for PT Re-Evaluation  09/06/18    Authorization Type  [employer] Medicare    Authorization Time Period  08/09/18 - 09/08/18    Authorization - Visit Number  6    Authorization - Number of Visits  10    PT Start Time  1640   Patient arrived late   PT Stop Time  1715    PT Time Calculation (min)  35 min    Equipment Utilized During Treatment  Gait belt    Activity Tolerance  Patient tolerated treatment well    Behavior During Therapy  WFL for tasks assessed/performed       Past Medical History:  Diagnosis Date  . Anxiety   . Depression   . GERD (gastroesophageal reflux disease)   . Hypercholesteremia   . Hypertension     Past Surgical History:  Procedure Laterality Date  . BREAST REDUCTION SURGERY    . CESAREAN SECTION    . COLONOSCOPY  08/2005  . ESOPHAGOGASTRODUODENOSCOPY N/A 08/19/2013   Procedure: ESOPHAGOGASTRODUODENOSCOPY (EGD);  Surgeon: Rogene Houston, MD;  [facility];  Service: Endoscopy;  Laterality: N/A;  11:15 - rescheduled to 7:30 - Ann notified pt  . excision of submandibular gland    . HEMORRHOID SURGERY    . INTRAMEDULLARY (IM) NAIL INTERTROCHANTERIC Left 07/14/2018   Procedure: INTRAMEDULLARY (IM) NAIL, LEFT HIP;  Surgeon: Renette Butters, MD;  Location: [facility];  Service: Orthopedics;  Laterality: Left;  . STOMACH SURGERY     years ago. ? reason  . TONSILLECTOMY      There were no vitals filed for this visit.      [facility] PT Assessment - 09/02/18 0001      Assessment   Medical  Diagnosis  Left femur fracture; Intramedullary Nail Left hip    Referring Provider (PT)  Sinda Du, MD    Onset Date/Surgical Date  07/14/18      Sit to Stand   Comments  5xSTS: 18.1 seconds   was 33.8 seconds     Ambulation/Gait   Ambulation/Gait Assistance  6: Modified independent (Device/Increase time)    Ambulation Distance (Feet)  207 Feet   2MWT   Assistive device  Rolling walker    Gait Pattern  Antalgic;Decreased stride length    Ambulation Surface  Level;Indoor    Gait velocity  0.53 m/s   was 0.17 m/s     Static Standing Balance   Static Standing - Balance Support  No upper extremity supported    Static Standing Balance -  Activities   Single Leg Stance - Right Leg;Single Leg Stance - Left Leg    Static Standing - Comment/# of Minutes  Right 8 seconds; Lt 4 seconds                   [facility] Adult PT Treatment/Exercise - 09/02/18 0001      Ambulation/Gait   Ambulation/Gait  Yes      Knee/Hip Exercises: Standing  Heel Raises  15 reps    Heel Raises Limitations  Toe raises incline slope    Lateral Step Up  Left;15 reps;Hand Hold: 1;Step Height: 4";Right    Forward Step Up  Both;15 reps;Hand Hold: 1;Step Height: 6"    Functional Squat  10 reps    Functional Squat Limitations  chair taps    SLS with Vectors  5x5" holds 1HR A BLE    Other Standing Knee Exercises  tandem stance 2x 30" foam    Other Standing Knee Exercises  sidestep 2RT no resistance, cued to reduce ER Rt LE               PT Short Term Goals - 08/12/18 1657      PT SHORT TERM GOAL #1   Title  Patient will report understanding and regular compliance with HEP to improve strength, balance, and overall functional mobility.    Time  2    Period  Weeks    Status  On-going    Target Date  08/23/18        PT Long Term Goals - 09/02/18 1731      PT LONG TERM GOAL #1   Title  Patient will demonstrate improvement of at least 1/2 MMT strength grade in all deficient muscle groups  in order to improve gait mechanics and overall functional mobility.    Time  4    Period  Weeks    Status  On-going      PT LONG TERM GOAL #2   Title  Patient will demonstrate ability to maintain single limb stance for at least 5 seconds on each LE to improve gait mechanics and safety.    Baseline  09/02/18: See objective measrues    Time  4    Period  Weeks    Status  Partially Met      PT LONG TERM GOAL #3   Title  Patient will demonstrate improvement on 5xSTS of at least 10 seconds indicating improved functional strength and balance.    Baseline  09/02/18: See objective measures    Time  4    Period  Weeks    Status  Achieved      PT LONG TERM GOAL #4   Title  Patient will demonstrate ability to ambualte at a gait speed of at least 0.8 m/s on 2MWT with LRAD indicating improved safety with household ambulation.    Baseline  09/02/18: Improved some, but not to 0.8 m/s see objective measures    Time  4    Period  Weeks    Status  On-going            Plan - 09/02/18 1730    Clinical Impression Statement  Patient arrived late to session and therefore session was limited due to this. Performed 2MWT and assessment of 5xSTS and balance for her MD visit. Patient demonstrated improvements in balance, on 2MWT and 5xSTS. Continued with exercises this session to improve strengthening and balance. Patient continued to demonstrate ER of right hip with sidestepping despite cueing with minimal improvements following cueing. Plan to complete the remainder of re-assessment next session to determine any further need for physical therapy.    Personal Factors and Comorbidities  Age;Comorbidity 2    Comorbidities  HTN, hyperlipidemia    Examination-Activity Limitations  Bed Mobility;Bend;Carry;Lift;Locomotion Level;Squat;Stairs;Stand;Transfers    Examination-Participation Restrictions  Shop;Community Activity    Stability/Clinical Decision Making  Stable/Uncomplicated    Rehab Potential  Fair  PT  Frequency  3x / week   Recommend 3x per week, but patient requesting 2x/week instead   PT Duration  4 weeks    PT Treatment/Interventions  ADLs/Self Care Home Management;Cryotherapy;Electrical Stimulation;DME Instruction;Gait training;Stair training;Functional mobility training;Therapeutic activities;Therapeutic exercise;Balance training;Neuromuscular re-education;Patient/family education;Orthotic Fit/Training;Manual techniques;Passive range of motion;Energy conservation;Taping    PT Next Visit Plan  Continue to improve sequence with SPC, progress functional strengthening exercises and balance training.    PT Home Exercise Plan  Evaluation: Quad set 5'' x 10; Clams 10x each 1x/day; 08/15/18 - sit<>stand       Patient will benefit from skilled therapeutic intervention in order to improve the following deficits and impairments:  Abnormal gait, Improper body mechanics, Decreased mobility, Decreased activity tolerance, Decreased endurance, Decreased strength, Decreased balance, Difficulty walking  Visit Diagnosis: 1. Other abnormalities of gait and mobility   2. Muscle weakness (generalized)        Problem List Patient Active Problem List   Diagnosis Date Noted  . Intertrochanteric fracture, closed, left, sequela 07/21/2018  . Dyslipidemia 07/21/2018  . Chronic constipation 07/21/2018  . Acute blood loss anemia 07/21/2018  . Osteoporosis 07/18/2018  . Anxiety 07/18/2018  . Hip fracture ([place]) 07/13/2018  . Hypertension   . Hypercholesteremia   . Routine cervical smear 12/03/2017  . Screening for colorectal cancer 12/03/2017  . Mixed stress and urge urinary incontinence 12/03/2017  . GERD (gastroesophageal reflux disease) 04/09/2017  . Encounter for screening colonoscopy 11/22/2016  . Nausea with vomiting 08/13/2013  . Bilateral leg weakness 06/24/2013  . Posture imbalance 06/24/2013  . Chronic radicular low back pain 06/24/2013   Clarene Critchley PT, DPT 5:36 PM,  09/02/18 [facility] [address] [place], [place], [address] Phone: [phone]   Fax:  [phone]  Name: [name] [name] MRN: [id number] Date of Birth: [birth year]

## 2018-09-04 DIAGNOSIS — S72142D Displaced intertrochanteric fracture of left femur, subsequent encounter for closed fracture with routine healing: Secondary | ICD-10-CM | POA: Diagnosis not present

## 2018-09-05 ENCOUNTER — Ambulatory Visit (HOSPITAL_COMMUNITY): Payer: Medicare Other | Admitting: Physical Therapy

## 2018-09-05 ENCOUNTER — Ambulatory Visit (HOSPITAL_COMMUNITY): Payer: Medicare Other | Admitting: Specialist

## 2018-09-05 ENCOUNTER — Encounter (HOSPITAL_COMMUNITY): Payer: Self-pay | Admitting: Physical Therapy

## 2018-09-05 ENCOUNTER — Other Ambulatory Visit: Payer: Self-pay

## 2018-09-05 DIAGNOSIS — R29898 Other symptoms and signs involving the musculoskeletal system: Secondary | ICD-10-CM | POA: Diagnosis not present

## 2018-09-05 DIAGNOSIS — M6281 Muscle weakness (generalized): Secondary | ICD-10-CM

## 2018-09-05 DIAGNOSIS — R2689 Other abnormalities of gait and mobility: Secondary | ICD-10-CM | POA: Diagnosis not present

## 2018-09-05 NOTE — Patient Instructions (Signed)
SINGLE LIMB STANCE    Stance: Stand at a countertop with a single leg on floor. Raise leg. Hold _10__ seconds. Repeat with other leg. _3__ reps per set, _1-2__ sets per day, _7__ days per week  Copyright  VHI. All rights reserved.  AMBULATION: Side Step    Stand at a countertop. Step sideways for about 10-15 feet. Repeat in opposite direction. _4__ reps per set, _1-2__ sets per day, __7_ days per week Use assistive device. Walk to tempo of metronome or music.  Copyright  VHI. All rights reserved.

## 2018-09-05 NOTE — Therapy (Signed)
[place], [place], [address] Phone: [phone]   Fax:  [phone]  Physical Therapy Treatment / Re-assessment / Discharge Summary  Patient Details  Name: [name] [name] MRN: [id number] Date of Birth: 1941/02/19 Referring Provider (PT): Sinda Du, MD   Encounter Date: 09/05/2018   Progress Note Reporting Period 08/09/18 to 09/05/18  See note below for Objective Data and Assessment of Progress/Goals.       PHYSICAL THERAPY DISCHARGE SUMMARY  Visits from Start of Care: 7  Current functional level related to goals / functional outcomes: See below   Remaining deficits: See below   Education / Equipment: HEP Plan: Patient agrees to discharge.  Patient goals were partially met. Patient is being discharged due to meeting the stated rehab goals.  ?????  Patient met all goals except ambulating at 0.7 m/s compared to goal of 0.8 m/s, however as this is very close, feel that patient will be safe to continue progressing speed at home.       PT End of Session - 09/05/18 1639    Visit Number  7    Number of Visits  8    Date for PT Re-Evaluation  09/06/18    Authorization Type  [employer] Medicare    Authorization Time Period  08/09/18 - 09/08/18    Authorization - Visit Number  7    Authorization - Number of Visits  10    PT Start Time  7517    PT Stop Time  1616    PT Time Calculation (min)  31 min    Equipment Utilized During Treatment  Gait belt    Activity Tolerance  Patient tolerated treatment well    Behavior During Therapy  WFL for tasks assessed/performed       Past Medical History:  Diagnosis Date  . Anxiety   . Depression   . GERD (gastroesophageal reflux disease)   . Hypercholesteremia   . Hypertension     Past Surgical History:  Procedure Laterality Date  . BREAST REDUCTION SURGERY    . CESAREAN SECTION    . COLONOSCOPY  08/2005  . ESOPHAGOGASTRODUODENOSCOPY N/A 08/19/2013    Procedure: ESOPHAGOGASTRODUODENOSCOPY (EGD);  Surgeon: Rogene Houston, MD;  [facility];  Service: Endoscopy;  Laterality: N/A;  11:15 - rescheduled to 7:30 - Ann notified pt  . excision of submandibular gland    . HEMORRHOID SURGERY    . INTRAMEDULLARY (IM) NAIL INTERTROCHANTERIC Left 07/14/2018   Procedure: INTRAMEDULLARY (IM) NAIL, LEFT HIP;  Surgeon: Renette Butters, MD;  Location: [facility];  Service: Orthopedics;  Laterality: Left;  . STOMACH SURGERY     years ago. ? reason  . TONSILLECTOMY      There were no vitals filed for this visit.  Subjective Assessment - 09/05/18 1632    Subjective  Patient reported that she saw her MD and they stated she is doing well. Patient reported that she has been doing her exercises at home and feels like she has made good progress. Patient reported that she feels like her knees are primarily what is limiting her at this point.    Patient Stated Goals  Improve strength    Currently in Pain?  No/denies         [facility] PT Assessment - 09/05/18 0001      Assessment   Medical Diagnosis  Left femur fracture; Intramedullary Nail Left hip    Referring Provider (PT)  Sinda Du, MD  Onset Date/Surgical Date  07/14/18      Home Environment   Living Environment  Private residence    Living Arrangements  Children    Type of [place] to enter    [place]  One level    [place] - 2 wheels;[place] - single point      Prior Function   Level of Independence  Independent;Independent with basic ADLs      Cognition   Overall Cognitive Status  Within Functional Limits for tasks assessed      Strength   Right Hip Flexion  5/5   was 4+   Right Hip Extension  4-/5   was 2+   Right Hip ABduction  5/5   was 4+   Left Hip Flexion  4+/5   was 3-   Left Hip Extension  4-/5   was 2-   Left Hip ABduction  4/5   was 2+   Right Knee Flexion  5/5   was 5   Right Knee  Extension  5/5   was 5   Left Knee Flexion  5/5   was 4+   Left Knee Extension  4+/5   was 3-   Right Ankle Dorsiflexion  5/5   was 5   Left Ankle Dorsiflexion  5/5   was 5     Palpation   Patella mobility  Hypomobile.     Palpation comment  No reported tenderness around patellas.      Ambulation/Gait   Ambulation/Gait  Yes    Ambulation/Gait Assistance  6: Modified independent (Device/Increase time)    Ambulation Distance (Feet)  276 Feet   2MWT   Assistive device  Straight cane    Gait Pattern  Antalgic;Decreased stride length    Ambulation Surface  Level;Indoor    Gait velocity  0.7 m/s   was 0.17 then 0.53 m/s     Static Standing Balance   Static Standing - Balance Support  No upper extremity supported    Static Standing Balance -  Activities   Single Leg Stance - Right Leg;Single Leg Stance - Left Leg    Static Standing - Comment/# of Minutes  Right 10 seconds; Lt 6 seconds                           PT Education - 09/05/18 1633    Education Details  Educated on re-assessment findings, plan to discharge and updated HEP.    Person(s) Educated  Patient    Methods  Explanation    Comprehension  Verbalized understanding       PT Short Term Goals - 09/05/18 1641      PT SHORT TERM GOAL #1   Title  Patient will report understanding and regular compliance with HEP to improve strength, balance, and overall functional mobility.    Time  2    Period  Weeks    Status  Achieved    Target Date  08/23/18        PT Long Term Goals - 09/05/18 1642      PT LONG TERM GOAL #1   Title  Patient will demonstrate improvement of at least 1/2 MMT strength grade in all deficient muscle groups in order to improve gait mechanics and overall functional mobility.    Time  4    Period  Weeks  Status  Achieved      PT LONG TERM GOAL #2   Title  Patient will demonstrate ability to maintain single limb stance for at least 5 seconds on each LE to improve gait  mechanics and safety.    Time  4    Period  Weeks    Status  Achieved      PT LONG TERM GOAL #3   Title  Patient will demonstrate improvement on 5xSTS of at least 10 seconds indicating improved functional strength and balance.    Baseline  09/02/18: See objective measures    Time  4    Period  Weeks    Status  Achieved      PT LONG TERM GOAL #4   Title  Patient will demonstrate ability to ambualte at a gait speed of at least 0.8 m/s on 2MWT with LRAD indicating improved safety with household ambulation.    Baseline  09/05/18: Patient ambulating at 0.7 m/s with straight cane    Time  4    Period  Weeks    Status  On-going            Plan - 09/05/18 1647    Clinical Impression Statement  Completed remainder of re-assessment this session. Patient achieved all goals except patient ambulated at 0.7 m/s compared to 0.8 m/s which was her goal. However, as patient had achieved all other goals and since patient was feeling good about her progress, patient is being discharged at this time. Remainder of session, reviewed re-assessment findings with patient and updated patient's HEP to include single limb stance balance and sidestepping at a countertop. Also discussed patient's discharge with her son and explained patient's new exercises. Explained that at this time patient appears to be doing well following her surgery, but that if she continues to have issues with her knees she could get a new referral for physical therapy.    Personal Factors and Comorbidities  Age;Comorbidity 2    Comorbidities  HTN, hyperlipidemia    Examination-Activity Limitations  Bed Mobility;Bend;Carry;Lift;Locomotion Level;Squat;Stairs;Stand;Transfers    Examination-Participation Restrictions  Shop;Community Activity    Stability/Clinical Decision Making  Stable/Uncomplicated    Rehab Potential  Fair    PT Frequency  3x / week   Recommend 3x per week, but patient requesting 2x/week instead   PT Duration  4 weeks     PT Treatment/Interventions  ADLs/Self Care Home Management;Cryotherapy;Electrical Stimulation;DME Instruction;Gait training;Stair training;Functional mobility training;Therapeutic activities;Therapeutic exercise;Balance training;Neuromuscular re-education;Patient/family education;Orthotic Fit/Training;Manual techniques;Passive range of motion;Energy conservation;Taping    PT Next Visit Plan  Discharged    PT Home Exercise Plan  Evaluation: Quad set 5'' x 10; Clams 10x each 1x/day; 08/15/18 - sit<>stand; 09/05/18: Sidestepping, SLS at [place] and Agree with Plan of Care  Patient;Family member/caregiver       Patient will benefit from skilled therapeutic intervention in order to improve the following deficits and impairments:  Abnormal gait, Improper body mechanics, Decreased mobility, Decreased activity tolerance, Decreased endurance, Decreased strength, Decreased balance, Difficulty walking  Visit Diagnosis: 1. Other abnormalities of gait and mobility   2. Muscle weakness (generalized)        Problem List Patient Active Problem List   Diagnosis Date Noted  . Intertrochanteric fracture, closed, left, sequela 07/21/2018  . Dyslipidemia 07/21/2018  . Chronic constipation 07/21/2018  . Acute blood loss anemia 07/21/2018  . Osteoporosis 07/18/2018  . Anxiety 07/18/2018  . Hip fracture ([place]) 07/13/2018  . Hypertension   . Hypercholesteremia   .  Routine cervical smear 12/03/2017  . Screening for colorectal cancer 12/03/2017  . Mixed stress and urge urinary incontinence 12/03/2017  . GERD (gastroesophageal reflux disease) 04/09/2017  . Encounter for screening colonoscopy 11/22/2016  . Nausea with vomiting 08/13/2013  . Bilateral leg weakness 06/24/2013  . Posture imbalance 06/24/2013  . Chronic radicular low back pain 06/24/2013   Clarene Critchley PT, DPT 4:48 PM, 09/05/18 [facility] [address]  [place], [place], [address] Phone: [phone]   Fax:  [phone]  Name: [name] [name] MRN: [id number] Date of Birth: 1940-07-13

## 2018-09-05 NOTE — Therapy (Signed)
[place], [place], [address] Phone: [phone]   Fax:  [phone]  Patient Details  Name: [name] [name] MRN: [id number] Date of Birth: March 09, 1940 Referring Provider:  Sinda Du, MD  Encounter Date: 09/05/2018   Patient screened for OT services this date, based on referral from Dr. Luan Pulling on 08/01/18.  Patient is functioning at prior level of independence and does not demonstrate need for skilled OT intervention at this time.    09/05/2018, 4:01 PM  [place], [place], [address] Phone: [phone]   Fax:  [phone]

## 2018-10-17 DIAGNOSIS — K210 Gastro-esophageal reflux disease with esophagitis: Secondary | ICD-10-CM | POA: Diagnosis not present

## 2018-10-17 DIAGNOSIS — Z23 Encounter for immunization: Secondary | ICD-10-CM | POA: Diagnosis not present

## 2018-10-17 DIAGNOSIS — I10 Essential (primary) hypertension: Secondary | ICD-10-CM | POA: Diagnosis not present

## 2018-10-17 DIAGNOSIS — M545 Low back pain: Secondary | ICD-10-CM | POA: Diagnosis not present

## 2018-11-06 DIAGNOSIS — M170 Bilateral primary osteoarthritis of knee: Secondary | ICD-10-CM | POA: Diagnosis not present

## 2018-12-17 DIAGNOSIS — I10 Essential (primary) hypertension: Secondary | ICD-10-CM | POA: Diagnosis not present

## 2018-12-17 DIAGNOSIS — M545 Low back pain: Secondary | ICD-10-CM | POA: Diagnosis not present

## 2018-12-17 DIAGNOSIS — G4700 Insomnia, unspecified: Secondary | ICD-10-CM | POA: Diagnosis not present

## 2019-06-04 ENCOUNTER — Ambulatory Visit: Payer: Medicare Other | Admitting: Family Medicine

## 2019-06-17 ENCOUNTER — Emergency Department (HOSPITAL_COMMUNITY)
Admission: EM | Admit: 2019-06-17 | Discharge: 2019-06-17 | Disposition: A | Discharge status: Home / Self Care | Payer: Medicare Other | Attending: Emergency Medicine | Admitting: Emergency Medicine

## 2019-06-17 ENCOUNTER — Encounter (HOSPITAL_COMMUNITY): Payer: Self-pay | Admitting: Emergency Medicine

## 2019-06-17 ENCOUNTER — Other Ambulatory Visit: Payer: Self-pay

## 2019-06-17 DIAGNOSIS — Z79899 Other long term (current) drug therapy: Secondary | ICD-10-CM | POA: Diagnosis not present

## 2019-06-17 DIAGNOSIS — I160 Hypertensive urgency: Secondary | ICD-10-CM

## 2019-06-17 DIAGNOSIS — R030 Elevated blood-pressure reading, without diagnosis of hypertension: Secondary | ICD-10-CM

## 2019-06-17 DIAGNOSIS — I10 Essential (primary) hypertension: Secondary | ICD-10-CM | POA: Insufficient documentation

## 2019-06-17 MED ORDER — ATENOLOL 50 MG PO TABS
ORAL_TABLET | ORAL | 0 refills | Status: DC
Start: 2019-06-17 — End: 2019-07-03

## 2019-06-17 NOTE — Progress Notes (Signed)
CSW in contact with patient and spoke with patients son who reports that he was able to get patient in for a new patient appointment with Dr.Gosrani with Internal Medicine on May 13th. CSW will update patient chart to reflect new pcp.   [place]  Clinical Social Worker  Ph: [phone]

## 2019-06-17 NOTE — ED Triage Notes (Signed)
Patient states she is supposed to take medication for blood pressure, cholesterol and antiinflammatory.  States she has not been taking her medications because she has not been able to get them refilled.  States her doctor is retiring and has not been able to find another doctor.

## 2019-06-17 NOTE — ED Provider Notes (Signed)
[facility] EMERGENCY DEPARTMENT Provider Note   CSN: [id number] Arrival date & time: 06/17/19  [id number]     History No chief complaint on file.   [name] [name] is a 79 y.o. female.  Patient is a 79 year old female with past medical history of hypertension presenting for elevated blood pressures.    States that at home yesterday she noticed her blood pressure was 0000000 systolic.  Today she checked and was 230/105.  Her son then made her come to the emergency room.  States that she has been having some intermittent headaches as well as stuffy nose.  No fevers.  No chest pain, no shortness of breath, no edema.  Does report some right hand numbness but this has been happening for "a while.  Seems to be positional in resolves if she lays her hand down.  States that her extremities are always cold.  Of note patient has been out of medication since December.  Her PCP retired in December and her new PCP did not believe in medications so would not refill her medication.        Past Medical History:  Diagnosis Date  . Anxiety   . Depression   . GERD (gastroesophageal reflux disease)   . Hypercholesteremia   . Hypertension     Patient Active Problem List   Diagnosis Date Noted  . Intertrochanteric fracture, closed, left, sequela 07/21/2018  . Dyslipidemia 07/21/2018  . Chronic constipation 07/21/2018  . Acute blood loss anemia 07/21/2018  . Osteoporosis 07/18/2018  . Anxiety 07/18/2018  . Hip fracture ([place]) 07/13/2018  . Hypertension   . Hypercholesteremia   . Routine cervical smear 12/03/2017  . Screening for colorectal cancer 12/03/2017  . Mixed stress and urge urinary incontinence 12/03/2017  . GERD (gastroesophageal reflux disease) 04/09/2017  . Encounter for screening colonoscopy 11/22/2016  . Nausea with vomiting 08/13/2013  . Bilateral leg weakness 06/24/2013  . Posture imbalance 06/24/2013  . Chronic radicular low back pain 06/24/2013    Past Surgical History:    Procedure Laterality Date  . BREAST REDUCTION SURGERY    . CESAREAN SECTION    . COLONOSCOPY  08/2005  . ESOPHAGOGASTRODUODENOSCOPY N/A 08/19/2013   Procedure: ESOPHAGOGASTRODUODENOSCOPY (EGD);  Surgeon: Rogene Houston, MD;  [facility];  Service: Endoscopy;  Laterality: N/A;  11:15 - rescheduled to 7:30 - Ann notified pt  . excision of submandibular gland    . HEMORRHOID SURGERY    . INTRAMEDULLARY (IM) NAIL INTERTROCHANTERIC Left 07/14/2018   Procedure: INTRAMEDULLARY (IM) NAIL, LEFT HIP;  Surgeon: Renette Butters, MD;  Location: [facility];  Service: Orthopedics;  Laterality: Left;  . STOMACH SURGERY     years ago. ? reason  . TONSILLECTOMY       OB History    Gravida  2   Para      Term      Preterm      AB      Living  1     SAB      TAB      Ectopic      Multiple      Live Births              Family History  Problem Relation Age of Onset  . Sleep apnea Son   . Diabetes Son     Social History   Tobacco Use  . Smoking status: Never Smoker  . Smokeless tobacco: Never Used  Substance Use Topics  . Alcohol  use: No    Alcohol/week: 0.0 standard drinks  . Drug use: No    Home Medications Prior to Admission medications   Medication Sig Start Date End Date Taking? Authorizing Provider  alendronate (FOSAMAX) 70 MG tablet Take 1 tablet (70 mg total) by mouth once a week. Take with a full glass of water on an empty stomach. Give on Sunday 08/01/18   Gerlene Fee, NP  atenolol (TENORMIN) 50 MG tablet Take 1 tablet (50 mg total) by mouth daily. 08/01/18   Gerlene Fee, NP  atenolol (TENORMIN) 50 MG tablet Take 50mg  by mouth daily 06/17/19   Caroline More, DO  Balsam [place]-Castor Oil [place]) OINT Apply topically to buttocks every shift 07/27/18   [provider]  celecoxib (CELEBREX) 200 MG capsule Take 1 capsule (200 mg total) by mouth daily after breakfast. 08/01/18   Gerlene Fee, NP  enoxaparin (LOVENOX) 40 MG/0.4ML injection  Inject 0.4 mLs (40 mg total) into the skin daily for 15 days. For 30 days post op for DVT prophylaxis 08/02/18 08/17/18  Gerlene Fee, NP  esomeprazole (NEXIUM) 40 MG capsule Take 40 mg by mouth daily. 12/27/18   [provider]  ferrous sulfate 325 (65 FE) MG tablet Take 325 mg by mouth daily with breakfast. 07/20/18   [provider]  LORazepam (ATIVAN) 0.5 MG tablet Take 0.25 mg by mouth daily as needed. 12/30/18   [provider]  NON FORMULARY Diet type: NAS 07/17/18   [provider]  Nutritional Supplements (ENSURE ENLIVE PO) Take 1 Bottle by mouth 2 (two) times daily between meals. 07/19/18   [provider]  omeprazole (PRILOSEC) 20 MG capsule Take 20 mg by mouth daily. 07/19/18   [provider]  Ostomy Supplies (SKIN PREP WIPES) MISC Apply to bilateral heels every shift for prevention 07/18/18   [provider]  oxybutynin (DITROPAN) 5 MG tablet Take 1 tablet (5 mg total) by mouth 2 (two) times a day. 08/01/18   Gerlene Fee, NP  oxyCODONE-acetaminophen (PERCOCET/ROXICET) 5-325 MG tablet Take 0.5 tablets by mouth 2 (two) times daily as needed. 12/31/18   [provider]  polyethylene glycol (MIRALAX / GLYCOLAX) 17 g packet Take 17 g by mouth daily as needed for mild constipation. 07/17/18   Kayleen Memos, DO  simvastatin (ZOCOR) 40 MG tablet Take 1 tablet (40 mg total) by mouth at bedtime. 08/01/18   Gerlene Fee, NP  tiZANidine (ZANAFLEX) 4 MG tablet Take 1 tablet (4 mg total) by mouth every 8 (eight) hours as needed for muscle spasms. 08/01/18   Gerlene Fee, NP  triamcinolone cream (KENALOG) 0.1 % Apply 1 application topically 2 (two) times daily as needed. 05/13/19   [provider]    Allergies    Morphine and related and Other  Review of Systems   Review of Systems  Constitutional: Negative for fever.  HENT: Positive for congestion.   Respiratory: Negative for shortness of breath.     Cardiovascular: Negative for chest pain, palpitations and leg swelling.    Physical Exam Updated Vital Signs BP (!) 205/101 (BP Location: Left Arm)   Pulse 70   Temp 98 F (36.7 C) (Oral)   Resp 18   Ht 4\' 10"  (1.473 m)   Wt 53.5 kg   SpO2 100%   BMI 24.66 kg/m   Physical Exam Constitutional:      General: She is not in acute distress.    Appearance: She is  normal weight.  HENT:     Head: Normocephalic.     Nose: Nose normal. No congestion.     Mouth/Throat:     Mouth: Mucous membranes are moist.     Pharynx: No oropharyngeal exudate or posterior oropharyngeal erythema.  Eyes:     Conjunctiva/sclera: Conjunctivae normal.     Pupils: Pupils are equal, round, and reactive to light.     Comments: No papilledema   Cardiovascular:     Rate and Rhythm: Normal rate and regular rhythm.     Heart sounds: No murmur. No friction rub. No gallop.   Pulmonary:     Effort: Pulmonary effort is normal. No respiratory distress.     Breath sounds: Normal breath sounds. No wheezing, rhonchi or rales.  Abdominal:     General: Bowel sounds are normal. There is no distension.     Tenderness: There is no abdominal tenderness.  Musculoskeletal:        General: No swelling or tenderness. Normal range of motion.     Cervical back: Normal range of motion.  Skin:    General: Skin is warm.     Findings: No rash.  Neurological:     General: No focal deficit present.     Mental Status: She is alert and oriented to person, place, and time. Mental status is at baseline.     Cranial Nerves: No cranial nerve deficit.     Motor: No weakness.  Psychiatric:        Mood and Affect: Mood normal.     ED Results / Procedures / Treatments   Labs (all labs ordered are listed, but only abnormal results are displayed) Labs Reviewed - No data to display  EKG None  Radiology No results found.  Procedures Procedures (including critical care time)  Medications Ordered in ED Medications - No data  to display  ED Course  I have reviewed the triage vital signs and the nursing notes.  Pertinent labs & imaging results that were available during my care of the patient were reviewed by me and considered in my medical decision making (see chart for details).    MDM Rules/Calculators/A&P                      Patient with elevated blood pressures secondary to noncompliance.  Has been out of medications for 4 months now.  No neurological deficits.  Symptoms of headache do not seem to be related to blood pressure as they are not acute.  Would advise patient restart her home medications and follow-up with the PCP. Will need to slowly lower BP over days especially given patient's age. I stressed the importance of close follow-up with a primary care doctor.  We will give 30-day supply of patient's medication so she could find a PCP. No lab work needed at this time as patient is not in hypertensive emergency  Patient discussed with Dr. Reather Converse who also saw patient   Final Clinical Impression(s) / ED Diagnoses Final diagnoses:  Elevated blood pressure reading  Hypertensive urgency    Rx / DC Orders ED Discharge Orders         Ordered    atenolol (TENORMIN) 50 MG tablet     06/17/19 [place], [place], DO 06/17/19 1055    Elnora Morrison, MD 06/17/19 1523

## 2019-06-17 NOTE — Discharge Instructions (Addendum)
Please make sure to find a primary doctor and follow up this week. Some in the area include Dr. Anastasio Champion

## 2019-06-17 NOTE — Progress Notes (Signed)
CSW has reviewed patients chart and notes that patient is without primary care and/or insurance. CSW will follow up with patient regarding this matter and provide assistance as needed.   CSW later at bedside to inquire about patient pcp situation. Patient reports that she was a recent patient of Dr. Luan Pulling until he retired. Patient states that she has been unable to get her prescriptions filled since December. Patient reports that she has active [facility] and [employer] coverage. CSW provided patient with list of primary care providers accepting new patients in her area. CSW offered to assist patient in making new patient appointment. Patient was receptive.   CSW in contract with Dr. [facility]. Receptionist informed CSW that patient must pick up screening form from office prior to being scheduled. CSW will relay information to patient.   [facility]  Clinical Social Worker  Ph: [phone]

## 2019-07-03 ENCOUNTER — Encounter (INDEPENDENT_AMBULATORY_CARE_PROVIDER_SITE_OTHER): Payer: Self-pay | Admitting: Nurse Practitioner

## 2019-07-03 ENCOUNTER — Other Ambulatory Visit: Payer: Self-pay

## 2019-07-03 ENCOUNTER — Ambulatory Visit (INDEPENDENT_AMBULATORY_CARE_PROVIDER_SITE_OTHER): Payer: Medicare Other | Admitting: Nurse Practitioner

## 2019-07-03 VITALS — BP 178/88 | HR 59 | Temp 96.8°F | Resp 16 | Ht 59.0 in | Wt 121.8 lb

## 2019-07-03 DIAGNOSIS — E785 Hyperlipidemia, unspecified: Secondary | ICD-10-CM

## 2019-07-03 DIAGNOSIS — Z139 Encounter for screening, unspecified: Secondary | ICD-10-CM | POA: Diagnosis not present

## 2019-07-03 DIAGNOSIS — Z8639 Personal history of other endocrine, nutritional and metabolic disease: Secondary | ICD-10-CM

## 2019-07-03 DIAGNOSIS — N3941 Urge incontinence: Secondary | ICD-10-CM

## 2019-07-03 DIAGNOSIS — I10 Essential (primary) hypertension: Secondary | ICD-10-CM

## 2019-07-03 DIAGNOSIS — R739 Hyperglycemia, unspecified: Secondary | ICD-10-CM | POA: Diagnosis not present

## 2019-07-03 DIAGNOSIS — E559 Vitamin D deficiency, unspecified: Secondary | ICD-10-CM | POA: Diagnosis not present

## 2019-07-03 DIAGNOSIS — D649 Anemia, unspecified: Secondary | ICD-10-CM | POA: Diagnosis not present

## 2019-07-03 DIAGNOSIS — M62838 Other muscle spasm: Secondary | ICD-10-CM

## 2019-07-03 DIAGNOSIS — M8000XS Age-related osteoporosis with current pathological fracture, unspecified site, sequela: Secondary | ICD-10-CM

## 2019-07-03 MED ORDER — TAMSULOSIN HCL 0.4 MG PO CAPS: 0.4000 mg | ORAL_CAPSULE | Freq: Every day | ORAL | 1 refills | Status: DC

## 2019-07-03 MED ORDER — TIZANIDINE HCL 2 MG PO TABS: 2.0000 mg | ORAL_TABLET | Freq: Three times a day (TID) | ORAL | 1 refills | Status: DC | PRN

## 2019-07-03 MED ORDER — LOSARTAN POTASSIUM 25 MG PO TABS: 25.0000 mg | ORAL_TABLET | Freq: Every day | ORAL | 1 refills | Status: DC

## 2019-07-03 NOTE — Patient Instructions (Signed)
[facility] Dietary Recommendations for Weight Loss What to Avoid . Avoid added sugars o Often added sugar can be found in processed foods such as many condiments, dry cereals, cakes, cookies, chips, crisps, crackers, candies, sweetened drinks, etc.  o Read labels and AVOID/DECREASE use of foods with the following in their ingredient list: Sugar, fructose, high fructose corn syrup, sucrose, glucose, maltose, dextrose, molasses, cane sugar, brown sugar, any type of syrup, agave nectar, etc.   . Avoid snacking in between meals . Avoid foods made with flour o If you are going to eat food made with flour, choose those made with whole-grains; and, minimize your consumption as much as is tolerable . Avoid processed foods o These foods are generally stocked in the middle of the grocery store. Focus on shopping on the perimeter of the grocery.  . Avoid Meat  o We recommend following a plant-based diet at [facility]. Thus, we recommend avoiding meat as a general rule. Consider eating beans, legumes, eggs, and/or dairy products for regular protein sources o If you plan on eating meat limit to 4 ounces of meat at a time and choose lean options such as Fish, chicken, turkey. Avoid red meat intake such as pork and/or steak What to Include . Vegetables o GREEN LEAFY VEGETABLES: Kale, spinach, mustard greens, collard greens, cabbage, broccoli, etc. o OTHER: Asparagus, cauliflower, eggplant, carrots, peas, Brussel sprouts, tomatoes, bell peppers, zucchini, beets, cucumbers, etc. . Grains, seeds, and legumes o Beans: kidney beans, black eyed peas, garbanzo beans, black beans, pinto beans, etc. o Whole, unrefined grains: brown rice, barley, bulgur, oatmeal, etc. . Healthy fats  o Avoid highly processed fats such as vegetable oil o Examples of healthy fats: avocado, olives, virgin olive oil, dark chocolate (?72% Cocoa), nuts (peanuts, almonds, walnuts, cashews, pecans, etc.) . None to Low  Intake of Animal Sources of Protein o Meat sources: chicken, turkey, salmon, tuna. Limit to 4 ounces of meat at one time. o Consider limiting dairy sources, but when choosing dairy focus on: PLAIN Greek yogurt, cottage cheese, high-protein milk . Fruit o Choose berries  When to Eat . Intermittent Fasting: o Choosing not to eat for a specific time period, but DO FOCUS ON HYDRATION when fasting o Multiple Techniques: - Time Restricted Eating: eat 3 meals in a day, each meal lasting no more than 60 minutes, no snacks between meals - 16-18 hour fast: fast for 16 to 18 hours up to 7 days a week. Often suggested to start with 2-3 nonconsecutive days per week.  . Remember the time you sleep is counted as fasting.  . Examples of eating schedule: Fast from 7:00pm-11:00am. Eat between 11:00am-7:00pm.  - 24-hour fast: fast for 24 hours up to every other day. Often suggested to start with 1 day per week . Remember the time you sleep is counted as fasting . Examples of eating schedule:  o Eating day: eat 2-3 meals on your eating day. If doing 2 meals, each meal should last no more than 90 minutes. If doing 3 meals, each meal should last no more than 60 minutes. Finish last meal by 7:00pm. o Fasting day: Fast until 7:00pm.  o IF YOU FEEL UNWELL FOR ANY REASON/IN ANY WAY WHEN FASTING, STOP FASTING BY EATING A NUTRITIOUS SNACK OR LIGHT MEAL o ALWAYS FOCUS ON HYDRATION DURING FASTS - Acceptable Hydration sources: water, broths, tea/coffee (black tea/coffee is best but using a small amount of whole-fat dairy products in coffee/tea is acceptable).  -   Poor Hydration Sources: anything with sugar or artificial sweeteners added to it  These recommendations have been developed for patients that are actively receiving medical care from either Dr. Anastasio Champion or Jeralyn Ruths, DNP, NP-C at [facility]. These recommendations are developed for patients with specific medical conditions and are not meant to be  distributed or used by others that are not actively receiving care from either provider listed above at [facility]. It is not appropriate to participate in the above eating plans without proper medical supervision.   Reference: Rexanne Mano. The obesity code. [place]; 2016.   Pain Management Take 500-1000mg  of tylenol by mouth three times a day.

## 2019-07-03 NOTE — Progress Notes (Signed)
Subjective:  Patient ID: [name] [name], female    DOB: 1940/10/21  Age: 79 y.o. MRN: [id number]  CC:  Chief Complaint  Patient presents with  . Establish Care  . Back Pain    hurts so bad in the lower right side of back that he hurts her to walk   . Other    urinary incontinence and urgency,   . Hypertension      HPI  This patient comes in today to establish care at this practice.  Her previous provider has retired is leaving the area so she is coming here to establish care.  She has a past medical history significant for hypertension, GERD, constipation, chronic lower back pain with radiculopathy, osteoporosis, urinary incontinence, anxiety, anemia, and hyperlipidemia.  Her main concerns today are significant back pain, hypertension, and urinary urgency with incontinence.  As for her back pain, she tells me she has been on Percocet in the past and is wondering if she can restart this now.  She also takes Celebrex daily.  She tells me her pain is still quite severe and bothersome.  She has also tried steroid injections in the past as well.  She was seen in the emergency department recently for hypertension and they prescribed atenolol.  It appears that she was taking the atenolol before but may be ran out of this prescription which resulted in hypertensive urgency and the emergency department visit.  She tells me that she has tried both Ditropan and Flomax in the past for her urinary urgency with incontinence.  She tells me Flomax seem to really help her.  Her son, who accompanies her today, states that he thinks the patient underwent a surgery in the past which resulted in having her urethra removed.    Past Medical History:  Diagnosis Date  . Anxiety   . Depression   . GERD (gastroesophageal reflux disease)   . Hypercholesteremia   . Hypertension       Family History  Problem Relation Age of Onset  . Heart disease Mother   . Sleep apnea Son   . Diabetes Son      Social History   Social History Narrative  . Not on file   Social History   Tobacco Use  . Smoking status: Never Smoker  . Smokeless tobacco: Never Used  Substance Use Topics  . Alcohol use: No    Alcohol/week: 0.0 standard drinks     Current Meds  Medication Sig  . alendronate (FOSAMAX) 70 MG tablet Take 1 tablet (70 mg total) by mouth once a week. Take with a full glass of water on an empty stomach. Give on Sunday  . atenolol (TENORMIN) 50 MG tablet Take 1 tablet (50 mg total) by mouth daily.  . celecoxib (CELEBREX) 200 MG capsule Take 1 capsule (200 mg total) by mouth daily after breakfast.  . esomeprazole (NEXIUM) 40 MG capsule Take 40 mg by mouth daily.  . [DISCONTINUED] atenolol (TENORMIN) 50 MG tablet Take 47m by mouth daily    ROS:  Review of Systems  Constitutional: Negative.   Eyes: Positive for blurred vision (chronic).  Respiratory: Negative for sputum production.   Cardiovascular: Negative for chest pain and palpitations.  Gastrointestinal: Positive for heartburn.  Genitourinary: Positive for urgency.  Musculoskeletal: Positive for back pain, joint pain and myalgias.  Neurological: Positive for headaches.     Objective:   Today's Vitals: BP (!) 178/88   Pulse (!) 59   Temp (!)  96.8 F (36 C)   Resp 16   Ht '4\' 11"'  (1.499 m)   Wt 121 lb 12.8 oz (55.2 kg)   SpO2 98%   BMI 24.60 kg/m  Vitals with BMI 07/03/2019 07/03/2019 06/17/2019  Height - '4\' 11"'  -  Weight - 121 lbs 13 oz -  BMI - 16.10 -  Systolic 960 454 098  Diastolic 88 119 147  Pulse - 59 70     Physical Exam Vitals reviewed.  Constitutional:      General: She is not in acute distress.    Appearance: Normal appearance.  HENT:     Head: Normocephalic and atraumatic.  Neck:     Vascular: No carotid bruit.  Cardiovascular:     Rate and Rhythm: Normal rate and regular rhythm.     Pulses: Normal pulses.     Heart sounds: Normal heart sounds.  Pulmonary:     Effort: Pulmonary  effort is normal.     Breath sounds: Normal breath sounds.  Skin:    General: Skin is warm and dry.  Neurological:     General: No focal deficit present.     Mental Status: She is alert and oriented to person, place, and time.  Psychiatric:        Mood and Affect: Mood normal.        Behavior: Behavior normal.        Judgment: Judgment normal.          Assessment and Plan   1. Hypertension, unspecified type   2. Dyslipidemia   3. Hyperglycemia   4. Anemia, unspecified type   5. Screening for condition   6. History of vitamin D deficiency   7. Age-related osteoporosis with current pathological fracture, sequela   8. Muscle spasm   9. Urgency incontinence      Plan: 1.  Blood pressure is still very uncontrolled.  In addition to her atenolol and will start her on low-dose losartan which she will take daily.  The son tells me he is able to check her blood pressure at home, and he will monitor this between now and her next appointment.  I did tell him if her systolic blood pressure is 1 80-200 that he needs to call this office and we may increase her dose of losartan between now and then.  She is going to follow-up in 2 weeks for blood work and blood pressure recheck.  I also went over red flag signs of heart attack and/or stroke and that she should call 9 1 if these were to occur.  They tell me that they understand.  2.-7.  I am getting collect blood work today for further evaluation of her chronic conditions as well as to screen for other conditions such as diabetes and hypothyroidism.  8.  She has taken tizanidine in the past in addition to her Celebrex, and would like a refill on this.  I will refill this and told her she can take 1 tablet every 8 hours as needed but did warn her of the risk of sedation and falls, thus I recommended that she avoid driving (he tells me she does not drive any longer) and that she monitors how the tizanidine makes her feel.  If she starts to feel  too sedated she should let me know and we will look for an alternative.  Karlton Lemon she understands.  I also recommend that she take 500 to 1000 mg of Tylenol every 8 hours to see if  this helps better her pain management.  At this office we do not start controlled sentences for the chronic management of pain.  I did discuss this with patient and the son and recommended that if this is what she feels she needs to control her pain then she will need to seek care elsewhere.  They tell me they will think about this.  I did tell them that we will try other options other than controlled sentences to try to get adequate pain relief for her.  They tell me they understand.  9.  Because she has had good relief with Flomax in the past, I will order this for her today.  May need to consider referral to OB/GYN in the future for further assistance with management.   Tests ordered Orders Placed This Encounter  Procedures  . CMP with eGFR([facility])  . CBC  . Lipid Panel  . Hemoglobin A1c  . TSH  . T3, Free  . T4, Free  . Vitamin D, 25-hydroxy  . Ferritin  . Iron and TIBC      Meds ordered this encounter  Medications  . losartan (COZAAR) 25 MG tablet    Sig: Take 1 tablet (25 mg total) by mouth daily.    Dispense:  30 tablet    Refill:  1    Order Specific Question:   Supervising Provider    Answer:   Hurshel Party C [[id number]]  . tiZANidine (ZANAFLEX) 2 MG tablet    Sig: Take 1 tablet (2 mg total) by mouth every 8 (eight) hours as needed for muscle spasms.    Dispense:  30 tablet    Refill:  1    Order Specific Question:   Supervising Provider    Answer:   Hurshel Party C [[id number]]  . tamsulosin (FLOMAX) 0.4 MG CAPS capsule    Sig: Take 1 capsule (0.4 mg total) by mouth daily.    Dispense:  30 capsule    Refill:  1    Order Specific Question:   Supervising Provider    Answer:   Doree Albee [[id number]]    Patient to follow-up in 2 weeks or sooner as needed. I spent 45 minutes dedicated to the care  of this patient on the date of this encounter which includes a combination of either face-to-face or virtual contact with the patient, review of records , and ordering of tests and/or procedures.   Ailene Ards, NP

## 2019-07-04 LAB — TSH: TSH: 3.17 mIU/L (ref 0.40–4.50)

## 2019-07-04 LAB — CBC
HCT: 43.2 % (ref 35.0–45.0)
Hemoglobin: 14.1 g/dL (ref 11.7–15.5)
MCH: 31.8 pg (ref 27.0–33.0)
MCHC: 32.6 g/dL (ref 32.0–36.0)
MCV: 97.5 fL (ref 80.0–100.0)
MPV: 11.1 fL (ref 7.5–12.5)
Platelets: 228 10*3/uL (ref 140–400)
RBC: 4.43 10*6/uL (ref 3.80–5.10)
RDW: 12 % (ref 11.0–15.0)
WBC: 7.8 10*3/uL (ref 3.8–10.8)

## 2019-07-04 LAB — COMPLETE METABOLIC PANEL WITH GFR
AG Ratio: 2 (calc) (ref 1.0–2.5)
ALT: 13 U/L (ref 6–29)
AST: 22 U/L (ref 10–35)
Albumin: 5.1 g/dL (ref 3.6–5.1)
Alkaline phosphatase (APISO): 71 U/L (ref 37–153)
BUN/Creatinine Ratio: 27 (calc) — ABNORMAL HIGH (ref 6–22)
BUN: 43 mg/dL — ABNORMAL HIGH (ref 7–25)
CO2: 30 mmol/L (ref 20–32)
Calcium: 9.8 mg/dL (ref 8.6–10.4)
Chloride: 98 mmol/L (ref 98–110)
Creat: 1.62 mg/dL — ABNORMAL HIGH (ref 0.60–0.93)
GFR, Est African American: 35 mL/min/{1.73_m2} — ABNORMAL LOW (ref >=60)
GFR, Est Non African American: 30 mL/min/{1.73_m2} — ABNORMAL LOW (ref >=60)
Globulin: 2.6 g/dL (calc) (ref 1.9–3.7)
Glucose, Bld: 88 mg/dL (ref 65–99)
Potassium: 4.3 mmol/L (ref 3.5–5.3)
Sodium: 137 mmol/L (ref 135–146)
Total Bilirubin: 0.7 mg/dL (ref 0.2–1.2)
Total Protein: 7.7 g/dL (ref 6.1–8.1)

## 2019-07-04 LAB — T3, FREE: T3, Free: 3.5 pg/mL (ref 2.3–4.2)

## 2019-07-04 LAB — HEMOGLOBIN A1C
Hgb A1c MFr Bld: 5.4 % of total Hgb (ref <5.7)
Mean Plasma Glucose: 108 (calc)
eAG (mmol/L): 6 (calc)

## 2019-07-04 LAB — LIPID PANEL
Cholesterol: 145 mg/dL (ref <200)
HDL: 67 mg/dL (ref >=50)
LDL Cholesterol (Calc): 63 mg/dL (calc)
Non-HDL Cholesterol (Calc): 78 mg/dL (calc) (ref <130)
Total CHOL/HDL Ratio: 2.2 (calc) (ref <5.0)
Triglycerides: 69 mg/dL (ref <150)

## 2019-07-04 LAB — VITAMIN D 25 HYDROXY (VIT D DEFICIENCY, FRACTURES): Vit D, 25-Hydroxy: 20 ng/mL — ABNORMAL LOW (ref 30–100)

## 2019-07-04 LAB — IRON, TOTAL/TOTAL IRON BINDING CAP
%SAT: 15 % (calc) — ABNORMAL LOW (ref 16–45)
Iron: 57 ug/dL (ref 45–160)
TIBC: 387 mcg/dL (calc) (ref 250–450)

## 2019-07-04 LAB — FERRITIN: Ferritin: 29 ng/mL (ref 16–288)

## 2019-07-04 LAB — T4, FREE: Free T4: 1.1 ng/dL (ref 0.8–1.8)

## 2019-07-07 ENCOUNTER — Encounter (INDEPENDENT_AMBULATORY_CARE_PROVIDER_SITE_OTHER): Payer: Self-pay

## 2019-07-17 ENCOUNTER — Ambulatory Visit (INDEPENDENT_AMBULATORY_CARE_PROVIDER_SITE_OTHER): Payer: Medicare Other | Admitting: Nurse Practitioner

## 2019-07-22 ENCOUNTER — Encounter (INDEPENDENT_AMBULATORY_CARE_PROVIDER_SITE_OTHER): Payer: Self-pay | Admitting: Internal Medicine

## 2019-07-22 ENCOUNTER — Other Ambulatory Visit: Payer: Self-pay

## 2019-07-22 ENCOUNTER — Ambulatory Visit (INDEPENDENT_AMBULATORY_CARE_PROVIDER_SITE_OTHER): Payer: Medicare Other | Admitting: Internal Medicine

## 2019-07-22 ENCOUNTER — Other Ambulatory Visit (INDEPENDENT_AMBULATORY_CARE_PROVIDER_SITE_OTHER): Payer: Self-pay | Admitting: Nurse Practitioner

## 2019-07-22 VITALS — BP 130/90 | HR 68 | Temp 97.3°F | Ht 59.0 in | Wt 125.4 lb

## 2019-07-22 DIAGNOSIS — M8000XS Age-related osteoporosis with current pathological fracture, unspecified site, sequela: Secondary | ICD-10-CM | POA: Diagnosis not present

## 2019-07-22 DIAGNOSIS — N183 Chronic kidney disease, stage 3 unspecified: Secondary | ICD-10-CM | POA: Diagnosis not present

## 2019-07-22 DIAGNOSIS — M62838 Other muscle spasm: Secondary | ICD-10-CM

## 2019-07-22 DIAGNOSIS — I10 Essential (primary) hypertension: Secondary | ICD-10-CM | POA: Diagnosis not present

## 2019-07-22 DIAGNOSIS — N3941 Urge incontinence: Secondary | ICD-10-CM | POA: Diagnosis not present

## 2019-07-22 DIAGNOSIS — E559 Vitamin D deficiency, unspecified: Secondary | ICD-10-CM | POA: Diagnosis not present

## 2019-07-22 MED ORDER — TIZANIDINE HCL 2 MG PO TABS: 2.0000 mg | ORAL_TABLET | Freq: Three times a day (TID) | ORAL | 1 refills | Status: DC | PRN

## 2019-07-22 MED ORDER — ATENOLOL 50 MG PO TABS: 50.0000 mg | ORAL_TABLET | Freq: Every day | ORAL | 0 refills | Status: DC

## 2019-07-22 NOTE — Patient Instructions (Signed)
VITAMIN D3 10,000 UNITS/DAY  Take 2 pills each of Vitamin D3 50000 units,for total dose of 10,000 units/day.

## 2019-07-22 NOTE — Progress Notes (Signed)
Metrics: Intervention Frequency ACO  Documented Smoking Status Yearly  Screened one or more times in 24 months  Cessation Counseling or  Active cessation medication Past 24 months  Past 24 months   Guideline developer: UpToDate (See UpToDate for funding source) Date Released: 2014       Wellness Office Visit  Subjective:  Patient ID: [name] [name], female    DOB: [birth year]  Age: 79 y.o. MRN: [id number]  CC: This 79 year old [place] lady, from [place], comes for follow-up. She was seen by [name] couple of weeks ago and there was concern regarding her uncontrolled hypertension and also back pain. HPI  She was noted to have vitamin D deficiency which was quite severe.  She has been advised to take vitamin D3 supplementation but has not started anything yet. She tells me that she cannot tolerate losartan and it does not help her blood pressure.  She is only taking atenolol 50 mg daily.  Her blood pressure reading at home this morning was [id number] systolic but in the office, it is more acceptable. She says she gets very anxious with the stress and this makes her blood pressure go higher. She has never had a history of cerebrovascular disease from what I can see from the medical records. She also complains of chronic low back pain.  She was receiving narcotic medications from her previous long-term physician, Dr. Luan Pulling.  She is aware that I would not prescribe such medications in this practice. Blood work that was done on the last visit showed increase in creatinine compared to previous.  She admits that she has not been drinking enough water. She also has severe osteoporosis based on bone density scan previously. Past Medical History:  Diagnosis Date  . Anxiety   . Depression   . GERD (gastroesophageal reflux disease)   . Hypercholesteremia   . Hypertension    Past Surgical History:  Procedure Laterality Date  . BREAST REDUCTION SURGERY    . CESAREAN SECTION    . COLONOSCOPY  08/2005  .  ESOPHAGOGASTRODUODENOSCOPY N/A 08/19/2013   Procedure: ESOPHAGOGASTRODUODENOSCOPY (EGD);  Surgeon: Rogene Houston, MD;  [facility];  Service: Endoscopy;  Laterality: N/A;  11:15 - rescheduled to 7:30 - Ann notified pt  . excision of submandibular gland    . HEMORRHOID SURGERY    . INTRAMEDULLARY (IM) NAIL INTERTROCHANTERIC Left 07/14/2018   Procedure: INTRAMEDULLARY (IM) NAIL, LEFT HIP;  Surgeon: Renette Butters, MD;  Location: [place];  Service: Orthopedics;  Laterality: Left;  . STOMACH SURGERY     years ago. ? reason  . TONSILLECTOMY       Family History  Problem Relation Age of Onset  . Heart disease Mother   . Sleep apnea Son   . 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   Social History   Tobacco Use  . Smoking status: Never Smoker  . Smokeless tobacco: Never Used  Substance Use Topics  . Alcohol use: No    Alcohol/week: 0.0 standard drinks    Current Meds  Medication Sig  . alendronate (FOSAMAX) 70 MG tablet Take 1 tablet (70 mg total) by mouth once a week. Take with a full glass of water on an empty stomach. Give on Sunday  . atenolol (TENORMIN) 50 MG tablet Take 1 tablet (50 mg total) by mouth daily.  . celecoxib (CELEBREX) 200 MG capsule Take 1 capsule (200 mg total) by mouth daily after  breakfast.  . esomeprazole (NEXIUM) 40 MG capsule Take 40 mg by mouth daily.  . tamsulosin (FLOMAX) 0.4 MG CAPS capsule Take 1 capsule (0.4 mg total) by mouth daily.  Marland Kitchen tiZANidine (ZANAFLEX) 2 MG tablet Take 1 tablet (2 mg total) by mouth every 8 (eight) hours as needed for muscle spasms.  . [DISCONTINUED] atenolol (TENORMIN) 50 MG tablet Take 1 tablet (50 mg total) by mouth daily.  . [DISCONTINUED] losartan (COZAAR) 25 MG tablet Take 1 tablet (25 mg total) by mouth daily.  . [DISCONTINUED] tiZANidine (ZANAFLEX) 2 MG tablet Take 1 tablet (2 mg total) by mouth every 8 (eight) hours as needed for muscle spasms.        Depression screen [facility] 2/9 12/03/2017 12/03/2017  Decreased Interest 1 1  Down, Depressed, Hopeless 1 1  PHQ - 2 Score 2 2  Altered sleeping 1 -  Tired, decreased energy 3 -  Change in appetite 1 -  Feeling bad or failure about yourself  0 -  Trouble concentrating 0 -  Moving slowly or fidgety/restless 0 -  Suicidal thoughts 0 -  PHQ-9 Score 7 -  Difficult doing work/chores Not difficult at all -     Objective:   Today's Vitals: BP 130/90 (BP Location: Left Arm, Patient Position: Sitting, Cuff Size: Normal)   Pulse 68   Temp (!) 97.3 F (36.3 C) (Temporal)   Ht 4\' 11"  (1.499 m)   Wt 125 lb 6.4 oz (56.9 kg)   SpO2 97%   BMI 25.33 kg/m  Vitals with BMI 07/22/2019 07/03/2019 07/03/2019  Height 4\' 11"  - 4\' 11"   Weight 125 lbs 6 oz - 121 lbs 13 oz  BMI 123XX123 - 123XX123  Systolic [id number]  Diastolic 90 88 [id number]  Pulse 68 - 59     Physical Exam  She looks systemically well but somewhat frail.  Blood pressure is actually acceptable today for her age.  She is alert and orientated without any obvious focal neurological signs.     Assessment   1. Hypertension, unspecified type   2. Urgency incontinence   3. Age-related osteoporosis with current pathological fracture, sequela   4. Vitamin D deficiency disease   5. Acute worsening of stage 3 chronic kidney disease   6. Muscle spasm       Tests ordered No orders of the defined types were placed in this encounter.    Plan: 1. I have sent over prescription for tennis diet which seems to help her muscle spasm and back pain in the back of the lower spine. 2. I have refilled his atenolol for hypertension.  I am still concerned that this is not well controlled but we will see her for follow-up very soon. 3. I have recommended that she start taking vitamin D3 10,000 units daily and she will take it to her pharmacy and obtain the correct dose of over-the-counter pills. 4. I will see her in 2 weeks for close follow-up  and I have stressed the importance of adequate water intake so she is well-hydrated.   Meds ordered this encounter  Medications  . atenolol (TENORMIN) 50 MG tablet    Sig: Take 1 tablet (50 mg total) by mouth daily.    Dispense:  90 tablet    Refill:  0  . tiZANidine (ZANAFLEX) 2 MG tablet    Sig: Take 1 tablet (2 mg total) by mouth every 8 (eight) hours as needed for muscle spasms.    Dispense:  90 tablet  Refill:  1    [name] Pence Luther Parody, MD

## 2019-08-05 ENCOUNTER — Other Ambulatory Visit: Payer: Self-pay

## 2019-08-05 ENCOUNTER — Encounter (INDEPENDENT_AMBULATORY_CARE_PROVIDER_SITE_OTHER): Payer: Self-pay | Admitting: Internal Medicine

## 2019-08-05 ENCOUNTER — Ambulatory Visit (INDEPENDENT_AMBULATORY_CARE_PROVIDER_SITE_OTHER): Payer: Medicare Other | Admitting: Internal Medicine

## 2019-08-05 VITALS — BP 185/90 | HR 56 | Temp 97.3°F | Ht 59.0 in | Wt 123.8 lb

## 2019-08-05 DIAGNOSIS — N3941 Urge incontinence: Secondary | ICD-10-CM

## 2019-08-05 DIAGNOSIS — F4329 Adjustment disorder with other symptoms: Secondary | ICD-10-CM

## 2019-08-05 DIAGNOSIS — I10 Essential (primary) hypertension: Secondary | ICD-10-CM | POA: Diagnosis not present

## 2019-08-05 DIAGNOSIS — E559 Vitamin D deficiency, unspecified: Secondary | ICD-10-CM

## 2019-08-05 MED ORDER — ESCITALOPRAM OXALATE 5 MG PO TABS: 5.0000 mg | ORAL_TABLET | Freq: Every day | ORAL | 3 refills | Status: DC

## 2019-08-05 MED ORDER — AMLODIPINE BESYLATE 5 MG PO TABS: 5.0000 mg | ORAL_TABLET | Freq: Every day | ORAL | 3 refills | Status: DC
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
## 2019-09-11 ENCOUNTER — Ambulatory Visit (INDEPENDENT_AMBULATORY_CARE_PROVIDER_SITE_OTHER): Payer: Medicare Other | Admitting: Nurse Practitioner

## 2019-09-11 ENCOUNTER — Inpatient Hospital Stay
Admission: AD | Admit: 2019-09-11 | Payer: Medicare Other | Source: Other Acute Inpatient Hospital | Admitting: Family Medicine

## 2019-09-11 ENCOUNTER — Other Ambulatory Visit: Payer: Self-pay

## 2019-09-11 ENCOUNTER — Encounter: Payer: Self-pay | Admitting: Nurse Practitioner

## 2019-09-11 ENCOUNTER — Encounter (INDEPENDENT_AMBULATORY_CARE_PROVIDER_SITE_OTHER): Payer: Self-pay | Admitting: Nurse Practitioner

## 2019-09-11 ENCOUNTER — Telehealth (INDEPENDENT_AMBULATORY_CARE_PROVIDER_SITE_OTHER): Payer: Self-pay | Admitting: Nurse Practitioner

## 2019-09-11 VITALS — BP 120/76 | HR 76 | Resp 16 | Ht 59.0 in | Wt 122.1 lb

## 2019-09-11 DIAGNOSIS — F419 Anxiety disorder, unspecified: Secondary | ICD-10-CM | POA: Diagnosis not present

## 2019-09-11 DIAGNOSIS — I7090 Unspecified atherosclerosis: Secondary | ICD-10-CM | POA: Diagnosis not present

## 2019-09-11 DIAGNOSIS — R002 Palpitations: Secondary | ICD-10-CM | POA: Diagnosis not present

## 2019-09-11 DIAGNOSIS — S73102A Unspecified sprain of left hip, initial encounter: Secondary | ICD-10-CM | POA: Diagnosis not present

## 2019-09-11 DIAGNOSIS — J322 Chronic ethmoidal sinusitis: Secondary | ICD-10-CM | POA: Diagnosis not present

## 2019-09-11 DIAGNOSIS — I214 Non-ST elevation (NSTEMI) myocardial infarction: Secondary | ICD-10-CM | POA: Diagnosis not present

## 2019-09-11 DIAGNOSIS — R55 Syncope and collapse: Secondary | ICD-10-CM | POA: Diagnosis not present

## 2019-09-11 DIAGNOSIS — I10 Essential (primary) hypertension: Secondary | ICD-10-CM | POA: Diagnosis not present

## 2019-09-11 DIAGNOSIS — R42 Dizziness and giddiness: Secondary | ICD-10-CM | POA: Diagnosis not present

## 2019-09-11 DIAGNOSIS — S8992XA Unspecified injury of left lower leg, initial encounter: Secondary | ICD-10-CM | POA: Diagnosis not present

## 2019-09-11 DIAGNOSIS — E785 Hyperlipidemia, unspecified: Secondary | ICD-10-CM | POA: Diagnosis not present

## 2019-09-11 DIAGNOSIS — Z20822 Contact with and (suspected) exposure to covid-19: Secondary | ICD-10-CM | POA: Diagnosis not present

## 2019-09-11 DIAGNOSIS — K449 Diaphragmatic hernia without obstruction or gangrene: Secondary | ICD-10-CM | POA: Diagnosis not present

## 2019-09-11 DIAGNOSIS — J3489 Other specified disorders of nose and nasal sinuses: Secondary | ICD-10-CM | POA: Diagnosis not present

## 2019-09-11 DIAGNOSIS — M1712 Unilateral primary osteoarthritis, left knee: Secondary | ICD-10-CM | POA: Diagnosis not present

## 2019-09-11 DIAGNOSIS — S79912A Unspecified injury of left hip, initial encounter: Secondary | ICD-10-CM | POA: Diagnosis not present

## 2019-09-11 DIAGNOSIS — W1839XA Other fall on same level, initial encounter: Secondary | ICD-10-CM | POA: Diagnosis not present

## 2019-09-11 DIAGNOSIS — I700 Atherosclerosis of aorta: Secondary | ICD-10-CM | POA: Diagnosis not present

## 2019-09-11 LAB — COMPLETE METABOLIC PANEL WITH GFR
AG Ratio: 1.7 (calc) (ref 1.0–2.5)
ALT: 9 U/L (ref 6–29)
AST: 17 U/L (ref 10–35)
Albumin: 4.3 g/dL (ref 3.6–5.1)
Alkaline phosphatase (APISO): 47 U/L (ref 37–153)
BUN/Creatinine Ratio: 21 (calc) (ref 6–22)
BUN: 31 mg/dL — ABNORMAL HIGH (ref 7–25)
CO2: 25 mmol/L (ref 20–32)
Calcium: 9 mg/dL (ref 8.6–10.4)
Chloride: 99 mmol/L (ref 98–110)
Creat: 1.49 mg/dL — ABNORMAL HIGH (ref 0.60–0.93)
GFR, Est African American: 38 mL/min/{1.73_m2} — ABNORMAL LOW (ref >=60)
GFR, Est Non African American: 33 mL/min/{1.73_m2} — ABNORMAL LOW (ref >=60)
Globulin: 2.6 g/dL (calc) (ref 1.9–3.7)
Glucose, Bld: 93 mg/dL (ref 65–99)
Potassium: 4.6 mmol/L (ref 3.5–5.3)
Sodium: 133 mmol/L — ABNORMAL LOW (ref 135–146)
Total Bilirubin: 0.6 mg/dL (ref 0.2–1.2)
Total Protein: 6.9 g/dL (ref 6.1–8.1)

## 2019-09-11 LAB — CBC WITH DIFFERENTIAL/PLATELET
Absolute Monocytes: 768 cells/uL (ref 200–950)
Basophils Absolute: 51 cells/uL (ref 0–200)
Basophils Relative: 0.8 %
Eosinophils Absolute: 109 cells/uL (ref 15–500)
Eosinophils Relative: 1.7 %
HCT: 38.4 % (ref 35.0–45.0)
Hemoglobin: 12.7 g/dL (ref 11.7–15.5)
Lymphs Abs: 2310 cells/uL (ref 850–3900)
MCH: 31.7 pg (ref 27.0–33.0)
MCHC: 33.1 g/dL (ref 32.0–36.0)
MCV: 95.8 fL (ref 80.0–100.0)
MPV: 10.4 fL (ref 7.5–12.5)
Monocytes Relative: 12 %
Neutro Abs: 3162 cells/uL (ref 1500–7800)
Neutrophils Relative %: 49.4 %
Platelets: 219 10*3/uL (ref 140–400)
RBC: 4.01 10*6/uL (ref 3.80–5.10)
RDW: 11.8 % (ref 11.0–15.0)
Total Lymphocyte: 36.1 %
WBC: 6.4 10*3/uL (ref 3.8–10.8)

## 2019-09-11 LAB — T3, FREE: T3, Free: 3 pg/mL (ref 2.3–4.2)

## 2019-09-11 LAB — TSH: TSH: 1.5 mIU/L (ref 0.40–4.50)

## 2019-09-11 LAB — T4, FREE: Free T4: 1.1 ng/dL (ref 0.8–1.8)

## 2019-09-11 MED ORDER — ATENOLOL 50 MG PO TABS: 50.0000 mg | ORAL_TABLET | Freq: Every day | ORAL | 0 refills | Status: DC

## 2019-09-11 MED ORDER — AMLODIPINE BESYLATE 2.5 MG PO TABS: 2.5000 mg | ORAL_TABLET | Freq: Every day | ORAL | 0 refills | Status: DC

## 2019-09-11 NOTE — Telephone Encounter (Signed)
I placed order for cardiac echo at [facility] please make sure this is scheduled

## 2019-09-11 NOTE — Patient Instructions (Signed)
Reduce your amlodipine from 5mg  to 2.5mg  by mouth per day.

## 2019-09-11 NOTE — Telephone Encounter (Signed)
I have faxed order to scheduling I will keep an eye out to see if it get done

## 2019-09-11 NOTE — Progress Notes (Signed)
042      Subjective:  Patient ID: [name] [name], female    DOB: Aug 30, 1940  Age: 79 y.o. MRN: [id number]  CC:  Chief Complaint  Patient presents with  . Hypertension    follow up  . Other    Near Syncope  . Anxiety      HPI  This patient arrives today for the above.  Hypertension: She continues on atenolol 50 mg daily and she is also taking amlodipine 5 mg daily which was started recently.  She was on losartan, however this was stopped due to renal decline after starting losartan.  Near-syncope: She does mention to me that she has been experiencing near syncopal episodes.  She denies actually losing consciousness.  She also has been experiencing some cardiac palpitations.  She tells me that she has had similar episodes for approximately 10 years.  She tells me she is undergone extensive work-up in [place] prior to moving to the [place].  She tells me the etiology was never made clear.  These episodes will sometimes occur with position changes, sometimes there is no known trigger for them.  She will often experience cold sweats, and will lay down.  After laying down for a few minutes she starts to feel better.  Anxiety: She is also started on low-dose of Lexapro at last office visit for anxiety.  She tells me that she has not been having any suicidal ideation.   Past Medical History:  Diagnosis Date  . Anxiety   . Depression   . GERD (gastroesophageal reflux disease)   . Hypercholesteremia   . Hypertension       Family History  Problem Relation Age of Onset  . Heart disease Mother   . Sleep apnea Son   . 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   Social History   Tobacco Use  . Smoking status: Never Smoker  . Smokeless tobacco: Never Used  Substance Use Topics  . Alcohol use: No    Alcohol/week: 0.0 standard drinks     Current Meds  Medication Sig  . alendronate (FOSAMAX) 70 MG tablet Take 1 tablet (70 mg  total) by mouth once a week. Take with a full glass of water on an empty stomach. Give on Sunday  . amLODipine (NORVASC) 2.5 MG tablet Take 1 tablet (2.5 mg total) by mouth daily.  Marland Kitchen atenolol (TENORMIN) 50 MG tablet Take 1 tablet (50 mg total) by mouth daily.  . celecoxib (CELEBREX) 200 MG capsule Take 1 capsule (200 mg total) by mouth daily after breakfast.  . Cholecalciferol (VITAMIN D-3) 125 MCG (5000 UT) TABS Take 2 tablets by mouth daily.  Marland Kitchen escitalopram (LEXAPRO) 5 MG tablet Take 1 tablet (5 mg total) by mouth daily.  Marland Kitchen esomeprazole (NEXIUM) 40 MG capsule Take 40 mg by mouth daily.  . tamsulosin (FLOMAX) 0.4 MG CAPS capsule Take 1 capsule (0.4 mg total) by mouth 2 (two) times daily.  Marland Kitchen tiZANidine (ZANAFLEX) 2 MG tablet Take 1 tablet (2 mg total) by mouth every 8 (eight) hours as needed for muscle spasms.  Marland Kitchen triamcinolone cream (KENALOG) 0.1 % Apply 1 application topically 2 (two) times daily.  . [DISCONTINUED] amLODipine (NORVASC) 5 MG tablet Take 1 tablet (5 mg total) by mouth daily.  . [DISCONTINUED] atenolol (TENORMIN) 50 MG tablet Take 1 tablet (50 mg total) by mouth daily.    ROS:  Review of Systems  Constitutional: Negative  for malaise/fatigue.  Eyes: Negative for blurred vision.  Respiratory: Negative for shortness of breath.   Cardiovascular: Positive for palpitations. Negative for chest pain and leg swelling.  Neurological: Positive for dizziness and tremors.     Objective:   Today's Vitals: BP 120/76   Pulse 76   Resp 16   Ht 4' 11" (1.499 m)   Wt 122 lb 1 oz (55.4 kg)   SpO2 99%   BMI 24.65 kg/m  Vitals with BMI 09/11/2019 08/05/2019 07/22/2019  Height 4' 11" 4' 11" 4' 11"  Weight 122 lbs 1 oz 123 lbs 13 oz 125 lbs 6 oz  BMI 24.64 38.25 05.39  Systolic 767 341 937  Diastolic 76 90 90  Pulse 76 56 68     Orthostatic vital signs: Sitting: 132/78 Standing: 122/72  Physical Exam Vitals reviewed.  Constitutional:      General: She is not in acute distress.     Appearance: Normal appearance.  HENT:     Head: Normocephalic and atraumatic.  Neck:     Vascular: No carotid bruit.  Cardiovascular:     Rate and Rhythm: Normal rate and regular rhythm.     Pulses: Normal pulses.     Heart sounds: Normal heart sounds.  Pulmonary:     Effort: Pulmonary effort is normal.     Breath sounds: Normal breath sounds.  Skin:    General: Skin is warm and dry.  Neurological:     General: No focal deficit present.     Mental Status: She is alert and oriented to person, place, and time.  Psychiatric:        Mood and Affect: Mood normal.        Behavior: Behavior normal.        Judgment: Judgment normal.    EKG: Sinus arrhythmia    Assessment and Plan   1. Near syncope   2. Palpitations   3. Hypertension, unspecified type   4. Anxiety      Plan: Etiology unclear.  We will collect blood work today for further evaluation including CBC, CMP, and thyroid panel.  EKG did not show any rhythm abnormalities.,  Send her to have an echocardiogram to make sure there are no structural abnormalities, no murmur noted on exam.  I am also going to refer her to cardiology for further evaluation especially because she mention she is been experiencing palpitations.  I believe amlodipine can potentially cause palpitations, some can reduce her dose from 5 mg daily down to 2.5 mg daily in the meantime.  She will follow-up with me in 1 month for close monitoring of her blood pressure and to see what the results of testing and referral show.  She will continue on her Lexapro as prescribed.   Tests ordered Orders Placed This Encounter  Procedures  . CBC with Differential/Platelets  . CMP with eGFR([facility])  . TSH  . T3, Free  . T4, Free  . Ambulatory referral to Cardiology  . ECHOCARDIOGRAM COMPLETE      Meds ordered this encounter  Medications  . amLODipine (NORVASC) 2.5 MG tablet    Sig: Take 1 tablet (2.5 mg total) by mouth daily.    Dispense:  90 tablet     Refill:  0    Order Specific Question:   Supervising Provider    Answer:   Hurshel Party C [[id number]]  . atenolol (TENORMIN) 50 MG tablet    Sig: Take 1 tablet (50 mg total) by mouth daily.  Dispense:  90 tablet    Refill:  0    Order Specific Question:   Supervising Provider    Answer:   Doree Albee [[id number]]    Patient to follow-up in 1 month or sooner as needed.  Ailene Ards, NP

## 2019-09-12 ENCOUNTER — Telehealth: Payer: Self-pay | Admitting: Cardiology

## 2019-09-12 DIAGNOSIS — R55 Syncope and collapse: Secondary | ICD-10-CM | POA: Diagnosis not present

## 2019-09-12 DIAGNOSIS — I10 Essential (primary) hypertension: Secondary | ICD-10-CM | POA: Diagnosis not present

## 2019-09-12 NOTE — Telephone Encounter (Signed)
Was called yesterday night regarding this patient who is at [facility] with plans to be transferred to [facility] and cardiology would see her in consult.   But per [facility] patient at [facility] as there are no beds available at [facility].   Will follow up later in the morning.   Rex Kras, [place], [facility]  Pager: [phone] Office: [phone] 4:14 AM, 09/12/19

## 2019-09-16 ENCOUNTER — Ambulatory Visit (HOSPITAL_COMMUNITY)
Admission: RE | Admit: 2019-09-16 | Discharge: 2019-09-16 | Disposition: A | Discharge status: Home / Self Care | Payer: Medicare Other | Source: Ambulatory Visit | Attending: Nurse Practitioner | Admitting: Nurse Practitioner

## 2019-09-16 ENCOUNTER — Other Ambulatory Visit: Payer: Self-pay

## 2019-09-16 DIAGNOSIS — R55 Syncope and collapse: Secondary | ICD-10-CM | POA: Diagnosis not present

## 2019-09-16 DIAGNOSIS — R002 Palpitations: Secondary | ICD-10-CM | POA: Diagnosis not present

## 2019-09-16 LAB — ECHOCARDIOGRAM COMPLETE
Area-P 1/2: 2.55 cm2
S' Lateral: 2.6 cm

## 2019-09-16 NOTE — Progress Notes (Signed)
*  PRELIMINARY RESULTS* Echocardiogram 2D Echocardiogram has been performed.  Samuel Germany 09/16/2019, 9:22 AM

## 2019-10-17 ENCOUNTER — Ambulatory Visit: Payer: Medicare Other | Admitting: Cardiology

## 2019-10-17 NOTE — Progress Notes (Deleted)
Cardiology Office Note  Date: 10/17/2019   ID: [name], [name] February 09, 1941, MRN [id number]  PCP:  Ailene Ards, NP  Cardiologist:  No primary care provider on file. Electrophysiologist:  None   No chief complaint on file.   History of Present Illness: [name] [name] is a 79 y.o. female referred for cardiology consultation by Ms. Pearline Cables NP for evaluation of palpitations and a longstanding history of recurrent dizziness and syncope.  Records indicate cardiology consultation with Dr. Denman George with [facility] in [place] in late July.  I reviewed the note, no clear diagnosis was made other than review of her symptoms and suspicion for orthostatic/vasovagal syncope.  I reviewed her recent echocardiogram from July as outlined below.  LV and RV contraction are normal.  She does have mitral annular calcification with mild mitral regurgitation, mild to moderate tricuspid regurgitation.  Past Medical History:  Diagnosis Date  . Anxiety   . Depression   . GERD (gastroesophageal reflux disease)   . Hypercholesteremia   . Hypertension     Past Surgical History:  Procedure Laterality Date  . BREAST REDUCTION SURGERY    . CESAREAN SECTION    . COLONOSCOPY  08/2005  . ESOPHAGOGASTRODUODENOSCOPY N/A 08/19/2013   Procedure: ESOPHAGOGASTRODUODENOSCOPY (EGD);  Surgeon: Rogene Houston, MD;  Location: [facility];  Service: Endoscopy;  Laterality: N/A;  11:15 - rescheduled to 7:30 - Ann notified pt  . excision of submandibular gland    . HEMORRHOID SURGERY    . INTRAMEDULLARY (IM) NAIL INTERTROCHANTERIC Left 07/14/2018   Procedure: INTRAMEDULLARY (IM) NAIL, LEFT HIP;  Surgeon: Renette Butters, MD;  Location: [facility];  Service: Orthopedics;  Laterality: Left;  . STOMACH SURGERY     years ago. ? reason  . TONSILLECTOMY      Current Outpatient Medications  Medication Sig Dispense Refill  . alendronate (FOSAMAX) 70 MG tablet Take 1 tablet (70 mg total) by mouth once a week. Take with a full glass of  water on an empty stomach. Give on Sunday 4 tablet 0  . amLODipine (NORVASC) 2.5 MG tablet Take 1 tablet (2.5 mg total) by mouth daily. 90 tablet 0  . atenolol (TENORMIN) 50 MG tablet Take 1 tablet (50 mg total) by mouth daily. 90 tablet 0  . celecoxib (CELEBREX) 200 MG capsule Take 1 capsule (200 mg total) by mouth daily after breakfast. 30 capsule 0  . Cholecalciferol (VITAMIN D-3) 125 MCG (5000 UT) TABS Take 2 tablets by mouth daily.    Marland Kitchen escitalopram (LEXAPRO) 5 MG tablet Take 1 tablet (5 mg total) by mouth daily. 30 tablet 3  . esomeprazole (NEXIUM) 40 MG capsule Take 40 mg by mouth daily.    . tamsulosin (FLOMAX) 0.4 MG CAPS capsule Take 1 capsule (0.4 mg total) by mouth 2 (two) times daily. 60 capsule 3  . tiZANidine (ZANAFLEX) 2 MG tablet Take 1 tablet (2 mg total) by mouth every 8 (eight) hours as needed for muscle spasms. 90 tablet 1  . triamcinolone cream (KENALOG) 0.1 % Apply 1 application topically 2 (two) times daily.     No current facility-administered medications for this visit.   Allergies:  Morphine and related and Other   Social History: The patient  reports that she has never smoked. She has never used smokeless tobacco. She reports that she does not drink alcohol and does not use drugs.   Family History: The patient's family history includes Diabetes in her son; Heart disease in her mother;  Sleep apnea in her son.   ROS:  Please see the history of present illness. Otherwise, complete review of systems is positive for {NONE DEFAULTED:18576::"none"}.  All other systems are reviewed and negative.   Physical Exam: VS:  There were no vitals taken for this visit., BMI There is no height or weight on file to calculate BMI.  Wt Readings from Last 3 Encounters:  09/11/19 122 lb 1 oz (55.4 kg)  08/05/19 123 lb 12.8 oz (56.2 kg)  07/22/19 125 lb 6.4 oz (56.9 kg)    General: Patient appears comfortable at rest. HEENT: Conjunctiva and lids normal, oropharynx clear with moist  mucosa. Neck: Supple, no elevated JVP or carotid bruits, no thyromegaly. Lungs: Clear to auscultation, nonlabored breathing at rest. Cardiac: Regular rate and rhythm, no S3 or significant systolic murmur, no pericardial rub. Abdomen: Soft, nontender, no hepatomegaly, bowel sounds present, no guarding or rebound. Extremities: No pitting edema, distal pulses 2+. Skin: Warm and dry. Musculoskeletal: No kyphosis. Neuropsychiatric: Alert and oriented x3, affect grossly appropriate.  ECG:  An ECG dated 09/11/2019 was personally reviewed today and demonstrated:  Sinus rhythm with PAC, 60 bpm.  Recent Labwork: 09/11/2019: ALT 9; AST 17; BUN 31; Creat 1.49; Hemoglobin 12.7; Platelets 219; Potassium 4.6; Sodium 133; TSH 1.50     Component Value Date/Time   CHOL 145 07/03/2019 1447   TRIG 69 07/03/2019 1447   HDL 67 07/03/2019 1447   CHOLHDL 2.2 07/03/2019 1447   LDLCALC 63 07/03/2019 1447    Other Studies Reviewed Today:  Echocardiogram 09/16/2019: 1. Left ventricular ejection fraction, by estimation, is 60 to 65%. The  left ventricle has normal function. The left ventricle has no regional  wall motion abnormalities. Left ventricular diastolic parameters are  consistent with Grade I diastolic  dysfunction (impaired relaxation).  2. Right ventricular systolic function is normal. The right ventricular  size is normal. There is normal pulmonary artery systolic pressure. The  estimated right ventricular systolic pressure is 44.6 mmHg.  3. Left atrial size was mildly dilated.  4. The mitral valve is abnormal, mildly calcified and with moderate  annular calcification. Mild mitral valve regurgitation.  5. Tricuspid valve regurgitation is mild to moderate.  6. The aortic valve is tricuspid. Aortic valve regurgitation is not  visualized.  7. The inferior vena cava is normal in size with greater than 50%  respiratory variability, suggesting right atrial pressure of 3 mmHg.  Assessment and  Plan:   Medication Adjustments/Labs and Tests Ordered: Current medicines are reviewed at length with the patient today.  Concerns regarding medicines are outlined above.   Tests Ordered: No orders of the defined types were placed in this encounter.   Medication Changes: No orders of the defined types were placed in this encounter.   Disposition:  Follow up {follow up:15908}  Signed, Satira Sark, MD, [facility] 10/17/2019 11:15 AM    [facility] [place]. [address], [place], [place] [address] Phone: [phone]; Fax: [phone]

## 2019-10-22 ENCOUNTER — Ambulatory Visit (INDEPENDENT_AMBULATORY_CARE_PROVIDER_SITE_OTHER): Payer: Medicare Other | Admitting: Nurse Practitioner

## 2019-11-18 ENCOUNTER — Encounter (INDEPENDENT_AMBULATORY_CARE_PROVIDER_SITE_OTHER): Payer: Self-pay | Admitting: Nurse Practitioner

## 2019-11-18 ENCOUNTER — Ambulatory Visit (INDEPENDENT_AMBULATORY_CARE_PROVIDER_SITE_OTHER): Payer: Medicare Other | Admitting: Nurse Practitioner

## 2019-11-18 ENCOUNTER — Other Ambulatory Visit (INDEPENDENT_AMBULATORY_CARE_PROVIDER_SITE_OTHER): Payer: Self-pay | Admitting: Nurse Practitioner

## 2019-11-18 ENCOUNTER — Other Ambulatory Visit: Payer: Self-pay

## 2019-11-18 VITALS — BP 178/94 | HR 80 | Temp 97.5°F | Resp 12 | Ht 59.0 in | Wt 127.8 lb

## 2019-11-18 DIAGNOSIS — M62838 Other muscle spasm: Secondary | ICD-10-CM

## 2019-11-18 DIAGNOSIS — R202 Paresthesia of skin: Secondary | ICD-10-CM

## 2019-11-18 DIAGNOSIS — I10 Essential (primary) hypertension: Secondary | ICD-10-CM | POA: Diagnosis not present

## 2019-11-18 DIAGNOSIS — E559 Vitamin D deficiency, unspecified: Secondary | ICD-10-CM

## 2019-11-18 DIAGNOSIS — W19XXXS Unspecified fall, sequela: Secondary | ICD-10-CM | POA: Diagnosis not present

## 2019-11-18 DIAGNOSIS — M810 Age-related osteoporosis without current pathological fracture: Secondary | ICD-10-CM

## 2019-11-18 DIAGNOSIS — R2689 Other abnormalities of gait and mobility: Secondary | ICD-10-CM

## 2019-11-18 MED ORDER — ALENDRONATE SODIUM 70 MG PO TABS: 70.0000 mg | ORAL_TABLET | ORAL | 0 refills | Status: DC

## 2019-11-18 MED ORDER — ATENOLOL 50 MG PO TABS: 50.0000 mg | ORAL_TABLET | Freq: Every day | ORAL | 1 refills | Status: DC

## 2019-11-18 MED ORDER — TIZANIDINE HCL 2 MG PO TABS: 2.0000 mg | ORAL_TABLET | Freq: Three times a day (TID) | ORAL | 1 refills | Status: DC | PRN

## 2019-11-18 NOTE — Patient Instructions (Signed)
Take ALENDRONATE one time per week  Take ATENOLOL one tablet by mouth daily

## 2019-11-18 NOTE — Progress Notes (Signed)
Subjective:  Patient ID: [name] [name], female    DOB: May 20, 1940  Age: 79 y.o. MRN: [id number]  CC:  Chief Complaint  Patient presents with  . Hypertension  . Other    Osteoporosis, hand paresthesias, vitamin D deficiency, concerns of balance      HPI  This patient arrives today for the above.  She continues on amlodipine for treatment of her hypertension.  She is also supposed be taking atenolol, but tells me she has not been taking this.  She is not sure what happened to the prescription.  She also is on alendronate for treatment of osteoporosis but does not bring this with her as part of her medications, she is not sure what happened to this medication.  She is experiencing some sensory changes to bilateral wrists and hands.  He tells me this has been going on for about 1 year.  She believes it is related to her arthritis.  She tells me she has undergone work-up in the past for carpal tunnel syndrome and other neurological abnormalities but no neurologic etiology was determined.  She also tells me that she has intermittent falls and does feel off balance at times.  She is interested working with physical therapy.  She does continue on her vitamin D3 supplement.  She is due for serum check today.  Past Medical History:  Diagnosis Date  . Anxiety   . Depression   . GERD (gastroesophageal reflux disease)   . Hypercholesteremia   . Hypertension       Family History  Problem Relation Age of Onset  . Heart disease Mother   . Sleep apnea Son   . 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   Social History   Tobacco Use  . Smoking status: Never Smoker  . Smokeless tobacco: Never Used  Substance Use Topics  . Alcohol use: No    Alcohol/week: 0.0 standard drinks     Current Meds  Medication Sig  . acetaminophen (TYLENOL) 650 MG CR tablet Take 650 mg by mouth every 8 (eight) hours as needed for pain.  Marland Kitchen alendronate  (FOSAMAX) 70 MG tablet Take 1 tablet (70 mg total) by mouth once a week. Take with a full glass of water on an empty stomach. Give on Sunday  . amLODipine (NORVASC) 2.5 MG tablet Take 1 tablet (2.5 mg total) by mouth daily.  . Ascorbic Acid (VITAMIN C) 100 MG tablet Take 500 mg by mouth daily.  . Cholecalciferol (VITAMIN D-3) 125 MCG (5000 UT) TABS Take 2 tablets by mouth daily.  . diphenhydrAMINE (BENADRYL) 25 mg capsule Take 25 mg by mouth every 6 (six) hours as needed.  Marland Kitchen escitalopram (LEXAPRO) 5 MG tablet Take 1 tablet (5 mg total) by mouth daily.  Marland Kitchen omeprazole (PRILOSEC) 20 MG capsule Take 20 mg by mouth daily.  . tamsulosin (FLOMAX) 0.4 MG CAPS capsule Take 1 capsule (0.4 mg total) by mouth 2 (two) times daily.  Marland Kitchen tiZANidine (ZANAFLEX) 2 MG tablet Take 1 tablet (2 mg total) by mouth every 8 (eight) hours as needed for muscle spasms.  . vitamin E 1000 UNIT capsule Take 1,000 Units by mouth daily.  . [DISCONTINUED] alendronate (FOSAMAX) 70 MG tablet Take 1 tablet (70 mg total) by mouth once a week. Take with a full glass of water on an empty stomach. Give on Sunday  . [DISCONTINUED] tiZANidine (ZANAFLEX) 2 MG tablet Take 1  tablet (2 mg total) by mouth every 8 (eight) hours as needed for muscle spasms.    ROS:  Review of Systems  Constitutional: Negative.   Eyes: Negative.   Respiratory: Negative.   Cardiovascular: Negative.   Gastrointestinal: Negative.   Musculoskeletal: Positive for falls and joint pain.  Neurological: Positive for sensory change. Negative for weakness and headaches.  Psychiatric/Behavioral: Negative.      Objective:   Today's Vitals: BP (!) 178/94   Pulse 80   Temp (!) 97.5 F (36.4 C)   Resp 12   Ht _0  (1.499 m)   Wt 127 lb 12.8 oz (58 kg)   SpO2 97%   BMI 25.81 kg/m  Vitals with BMI 11/18/2019 09/11/2019 08/05/2019  Height _1  _2  _3   Weight 127 lbs 13 oz 122 lbs 1 oz 123 lbs 13 oz  BMI 25.8 40.98 11.91  Systolic 478 295 621  Diastolic  94 76 90  Pulse 80 76 56     Physical Exam Vitals reviewed.  Constitutional:      General: She is not in acute distress.    Appearance: Normal appearance.  HENT:     Head: Normocephalic and atraumatic.  Neck:     Vascular: No carotid bruit.  Cardiovascular:     Rate and Rhythm: Normal rate and regular rhythm.     Pulses: Normal pulses.     Heart sounds: Normal heart sounds.  Pulmonary:     Effort: Pulmonary effort is normal.     Breath sounds: Normal breath sounds.  Musculoskeletal:     Cervical back: Normal. No bony tenderness.     Thoracic back: Normal. No bony tenderness.  Skin:    General: Skin is warm and dry.  Neurological:     General: No focal deficit present.     Mental Status: She is alert and oriented to person, place, and time.     Gait: Gait abnormal (uses cane for assistance).     Deep Tendon Reflexes:     Reflex Scores:      Brachioradialis reflexes are 2+ on the right side and 2+ on the left side. Psychiatric:        Mood and Affect: Mood normal.        Behavior: Behavior normal.        Judgment: Judgment normal.          Assessment and Plan   1. Age-related osteoporosis without current pathological fracture   2. Hypertension, unspecified type   3. Paresthesias   4. Vitamin D deficiency   5. Fall, sequela   6. Balance problem   7. Muscle spasm      Plan: 1.  Alendronate reordered and sent to patient's pharmacy.  She was told to take 1 tablet by mouth every week. 2.  Reordered atenolol and she will continue on her amlodipine as well.  She will follow-up in 2 weeks to check blood pressure closely. 3.  We did discuss being reevaluated by neurology for possible etiologies of her paresthesias.  She tells me she does not want do this at this time.  I have ordered bilateral wrist brace that she can use to see if this helps. 4.  We will check a vitamin D level in her blood today.  She will continue on supplement. 5.-7.  She is requesting refill of  tizanidine, prescription sent to pharmacy.  Also will refer her to physical therapy for assistance with balance and strength.   Tests  ordered Orders Placed This Encounter  Procedures  . For home use only DME Other see comment  . CMP with eGFR([facility])  . Vitamin D, 25-hydroxy  . Ambulatory referral to Physical Therapy      Meds ordered this encounter  Medications  . alendronate (FOSAMAX) 70 MG tablet    Sig: Take 1 tablet (70 mg total) by mouth once a week. Take with a full glass of water on an empty stomach. Give on Sunday    Dispense:  4 tablet    Refill:  0    Order Specific Question:   Supervising Provider    Answer:   Hurshel Party C [[id number]]  . atenolol (TENORMIN) 50 MG tablet    Sig: Take 1 tablet (50 mg total) by mouth daily.    Dispense:  90 tablet    Refill:  1    Order Specific Question:   Supervising Provider    Answer:   Hurshel Party C [[id number]]  . tiZANidine (ZANAFLEX) 2 MG tablet    Sig: Take 1 tablet (2 mg total) by mouth every 8 (eight) hours as needed for muscle spasms.    Dispense:  90 tablet    Refill:  1    Order Specific Question:   Supervising Provider    Answer:   Doree Albee [[id number]]    Patient to follow-up in 2 weeks for close monitoring of blood pressure.  Ailene Ards, NP

## 2019-11-19 ENCOUNTER — Encounter (INDEPENDENT_AMBULATORY_CARE_PROVIDER_SITE_OTHER): Payer: Self-pay | Admitting: Nurse Practitioner

## 2019-11-19 DIAGNOSIS — N1832 Chronic kidney disease, stage 3b: Secondary | ICD-10-CM

## 2019-11-19 LAB — COMPLETE METABOLIC PANEL WITH GFR
AG Ratio: 1.6 (calc) (ref 1.0–2.5)
ALT: 6 U/L (ref 6–29)
AST: 16 U/L (ref 10–35)
Albumin: 4.4 g/dL (ref 3.6–5.1)
Alkaline phosphatase (APISO): 42 U/L (ref 37–153)
BUN/Creatinine Ratio: 20 (calc) (ref 6–22)
BUN: 32 mg/dL — ABNORMAL HIGH (ref 7–25)
CO2: 26 mmol/L (ref 20–32)
Calcium: 9.3 mg/dL (ref 8.6–10.4)
Chloride: 99 mmol/L (ref 98–110)
Creat: 1.57 mg/dL — ABNORMAL HIGH (ref 0.60–0.93)
GFR, Est African American: 36 mL/min/{1.73_m2} — ABNORMAL LOW (ref >=60)
GFR, Est Non African American: 31 mL/min/{1.73_m2} — ABNORMAL LOW (ref >=60)
Globulin: 2.7 g/dL (calc) (ref 1.9–3.7)
Glucose, Bld: 88 mg/dL (ref 65–99)
Potassium: 4.2 mmol/L (ref 3.5–5.3)
Sodium: 136 mmol/L (ref 135–146)
Total Bilirubin: 0.9 mg/dL (ref 0.2–1.2)
Total Protein: 7.1 g/dL (ref 6.1–8.1)

## 2019-11-19 LAB — VITAMIN D 25 HYDROXY (VIT D DEFICIENCY, FRACTURES): Vit D, 25-Hydroxy: 32 ng/mL (ref 30–100)

## 2019-12-01 ENCOUNTER — Telehealth (INDEPENDENT_AMBULATORY_CARE_PROVIDER_SITE_OTHER): Payer: Self-pay

## 2019-12-01 NOTE — Telephone Encounter (Signed)
Beth with [facility] called and left a voice message that she needed the insurance information for this patient and she did not see it on the demographic paperwork that she received for the referral. Beth verbalized an understanding and said thank you.

## 2019-12-02 ENCOUNTER — Ambulatory Visit (INDEPENDENT_AMBULATORY_CARE_PROVIDER_SITE_OTHER): Payer: Medicare Other | Admitting: Nurse Practitioner

## 2019-12-04 ENCOUNTER — Ambulatory Visit (INDEPENDENT_AMBULATORY_CARE_PROVIDER_SITE_OTHER): Payer: Medicare Other | Admitting: Nurse Practitioner

## 2019-12-04 ENCOUNTER — Other Ambulatory Visit (INDEPENDENT_AMBULATORY_CARE_PROVIDER_SITE_OTHER): Payer: Self-pay | Admitting: Internal Medicine

## 2019-12-05 ENCOUNTER — Other Ambulatory Visit (INDEPENDENT_AMBULATORY_CARE_PROVIDER_SITE_OTHER): Payer: Self-pay | Admitting: Internal Medicine

## 2019-12-08 ENCOUNTER — Telehealth (INDEPENDENT_AMBULATORY_CARE_PROVIDER_SITE_OTHER): Payer: Self-pay

## 2019-12-08 NOTE — Telephone Encounter (Signed)
Patient called and left a voice message about her refill request for Lexapro. I see that this was sent today to [facility]. I called patient and let her know and she verbalized an understanding.

## 2019-12-23 ENCOUNTER — Ambulatory Visit (INDEPENDENT_AMBULATORY_CARE_PROVIDER_SITE_OTHER): Payer: Medicare Other | Admitting: Nurse Practitioner

## 2019-12-30 ENCOUNTER — Ambulatory Visit (INDEPENDENT_AMBULATORY_CARE_PROVIDER_SITE_OTHER): Payer: Medicare Other | Admitting: Nurse Practitioner

## 2019-12-30 ENCOUNTER — Other Ambulatory Visit (INDEPENDENT_AMBULATORY_CARE_PROVIDER_SITE_OTHER): Payer: Self-pay | Admitting: Nurse Practitioner

## 2019-12-30 ENCOUNTER — Encounter (INDEPENDENT_AMBULATORY_CARE_PROVIDER_SITE_OTHER): Payer: Self-pay | Admitting: Nurse Practitioner

## 2019-12-30 ENCOUNTER — Telehealth (INDEPENDENT_AMBULATORY_CARE_PROVIDER_SITE_OTHER): Payer: Self-pay

## 2019-12-30 ENCOUNTER — Other Ambulatory Visit: Payer: Self-pay

## 2019-12-30 VITALS — BP 140/88 | HR 60 | Temp 98.1°F | Ht 59.0 in | Wt 131.2 lb

## 2019-12-30 DIAGNOSIS — R42 Dizziness and giddiness: Secondary | ICD-10-CM

## 2019-12-30 DIAGNOSIS — I10 Essential (primary) hypertension: Secondary | ICD-10-CM

## 2019-12-30 DIAGNOSIS — G4700 Insomnia, unspecified: Secondary | ICD-10-CM | POA: Diagnosis not present

## 2019-12-30 DIAGNOSIS — E559 Vitamin D deficiency, unspecified: Secondary | ICD-10-CM

## 2019-12-30 DIAGNOSIS — M810 Age-related osteoporosis without current pathological fracture: Secondary | ICD-10-CM

## 2019-12-30 DIAGNOSIS — M545 Low back pain, unspecified: Secondary | ICD-10-CM | POA: Diagnosis not present

## 2019-12-30 DIAGNOSIS — N3941 Urge incontinence: Secondary | ICD-10-CM

## 2019-12-30 MED ORDER — ALENDRONATE SODIUM 70 MG PO TABS: 70.0000 mg | ORAL_TABLET | ORAL | 2 refills | Status: DC

## 2019-12-30 MED ORDER — AMLODIPINE BESYLATE 2.5 MG PO TABS: 2.5000 mg | ORAL_TABLET | Freq: Every day | ORAL | 1 refills | Status: DC

## 2019-12-30 MED ORDER — ESCITALOPRAM OXALATE 5 MG PO TABS: 5.0000 mg | ORAL_TABLET | Freq: Every day | ORAL | 1 refills | Status: DC

## 2019-12-30 MED ORDER — TAMSULOSIN HCL 0.4 MG PO CAPS: 0.4000 mg | ORAL_CAPSULE | Freq: Two times a day (BID) | ORAL | 6 refills | Status: DC

## 2019-12-30 NOTE — Progress Notes (Signed)
Subjective:  Patient ID: [name] [name], female    DOB: [birth year]  Age: 79 y.o. MRN: [id number]  CC:  Chief Complaint  Patient presents with  . Follow-up    lower left back pain, concerned about weight gain, feeling dizzy and like she wants to vomit sometimes and ate candy and felt better  . Back Pain  . Other    Dizziness, vitamin d deficiency  . Hypertension      HPI  This patient arrives today for the above.  Back pain: She tells me she has been experiencing low back pain for the last 2 weeks.  She tells me she does not know any traumatic or triggering events that elicited the pain.  The pain seems to get worse with cold but applying heat does not improve the pain.  She has tried Tylenol without improvement as well.  She denies any weakness, sensation changes to her lower extremities.  She denies saddle paresthesia she also denies new incontinence of urine or bowels.  Dizziness: She had an episode earlier today where she started to feel dizzy and felt a rapid heartbeat.  She stated she ate some candy and started to feel better.  She has been evaluated for chest pain and near syncopal episodes in the past.  She was in the emergency department earlier this year for this and no obvious cause was determined.  Recommendations were for her to wear compression stockings, focus on hydration, and follow-up with cardiology for further evaluation in the outpatient setting.  She is wearing her compression stockings today.  She is scheduled to see cardiology next month.  Vitamin D deficiency: She continues on her vitamin D3 supplement.  Last serum check was 32.  Hypertension: Her atenolol was restarted at last office visit and she also continues on amlodipine.  She is tolerating these well.  Past Medical History:  Diagnosis Date  . Anxiety   . Depression   . GERD (gastroesophageal reflux disease)   . Hypercholesteremia   . Hypertension       Family History  Problem Relation Age  of Onset  . Heart disease Mother   . Sleep apnea Son   . 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   Social History   Tobacco Use  . Smoking status: Never Smoker  . Smokeless tobacco: Never Used  Substance Use Topics  . Alcohol use: No    Alcohol/week: 0.0 standard drinks     Current Meds  Medication Sig  . acetaminophen (TYLENOL) 650 MG CR tablet Take 650 mg by mouth every 8 (eight) hours as needed for pain.  Marland Kitchen alendronate (FOSAMAX) 70 MG tablet Take 1 tablet (70 mg total) by mouth once a week. Take with a full glass of water on an empty stomach. Give on Sunday  . amLODipine (NORVASC) 2.5 MG tablet Take 1 tablet (2.5 mg total) by mouth daily.  . Ascorbic Acid (VITAMIN C) 100 MG tablet Take 500 mg by mouth daily.  Marland Kitchen atenolol (TENORMIN) 50 MG tablet Take 1 tablet (50 mg total) by mouth daily.  . Cholecalciferol (VITAMIN D-3) 125 MCG (5000 UT) TABS Take 2 tablets by mouth daily.  . diphenhydrAMINE (BENADRYL) 25 mg capsule Take 25 mg by mouth every 6 (six) hours as needed.  Marland Kitchen escitalopram (LEXAPRO) 5 MG tablet TAKE 1 TABLET BY MOUTH ONCE DAILY.  Marland Kitchen omeprazole (PRILOSEC) 20 MG capsule Take 20 mg by  mouth daily.  . tamsulosin (FLOMAX) 0.4 MG CAPS capsule Take 1 capsule (0.4 mg total) by mouth 2 (two) times daily.  Marland Kitchen tiZANidine (ZANAFLEX) 2 MG tablet Take 1 tablet (2 mg total) by mouth every 8 (eight) hours as needed for muscle spasms.  Marland Kitchen triamcinolone cream (KENALOG) 0.1 % APPLY TO AFFECTED AREA TWICE DAILY  . vitamin E 1000 UNIT capsule Take 1,000 Units by mouth daily.    ROS:  See HPI   Objective:   Today's Vitals: BP 140/88   Pulse 60   Temp 98.1 F (36.7 C) (Temporal)   Ht 4\' 11"  (1.499 m)   Wt 131 lb 3.2 oz (59.5 kg)   SpO2 96%   BMI 26.50 kg/m  Vitals with BMI 12/30/2019 11/18/2019 09/11/2019  Height 4\' 11"  4\' 11"  4\' 11"   Weight 131 lbs 3 oz 127 lbs 13 oz 122 lbs 1 oz  BMI 26.49 28.3 15.17  Systolic 616 073 710    Diastolic 88 94 76  Pulse 60 80 76     Physical Exam Vitals reviewed.  Constitutional:      General: She is not in acute distress.    Appearance: Normal appearance.  HENT:     Head: Normocephalic and atraumatic.  Neck:     Vascular: No carotid bruit.  Cardiovascular:     Rate and Rhythm: Normal rate and regular rhythm.     Pulses: Normal pulses.     Heart sounds: Normal heart sounds.  Pulmonary:     Effort: Pulmonary effort is normal.     Breath sounds: Normal breath sounds.  Musculoskeletal:     Cervical back: No bony tenderness.     Thoracic back: No bony tenderness.     Lumbar back: No bony tenderness. Normal range of motion.  Skin:    General: Skin is warm and dry.  Neurological:     General: No focal deficit present.     Mental Status: She is alert and oriented to person, place, and time.     Sensory: Sensation is intact.     Motor: Motor function is intact.     Coordination: Coordination is intact.     Gait: Gait abnormal (antalgic gait; chronic, uses cane).  Psychiatric:        Mood and Affect: Mood normal.        Behavior: Behavior normal.        Judgment: Judgment normal.      EKG: Sinus bradycardia Orthostatic vital signs: Sitting: 154/86 Standing: 158/86 Laying: 148/80    Assessment and Plan   1. Acute bilateral low back pain, unspecified whether sciatica present   2. Age-related osteoporosis without current pathological fracture   3. Insomnia, unspecified type   4. Dizziness   5. Primary hypertension   6. Vitamin D deficiency      Plan: 1.,  2.  We will order x-ray for further evaluation.  Low suspicion for fracture at this time however she does have history of osteoporosis so would like to rule this out.  No signs of cauda equina syndrome at this time, if back x-ray is negative may need to consider working with physical therapy. 3.  She mentions she has a hard time sleeping.  She was on Ambien in the past however I do not especially want to  restart this medicine due to her age.  Will refer her to neurology for further evaluation and management. 4.  Clinical assessment, EKG, and orthostatic vital signs do not show any  obvious causes for her symptoms.  I wonder if maybe her blood sugar dropped, so encouraged her to eat regularly throughout the day.  She was also encouraged to follow-up with her cardiologist as scheduled next month. 5.  She will continue on her current medication regimen for now and we will monitor this closely.  We have gain tighter control before but she has felt unwell with additional agents and has stopped them on her own.  We will monitor this closely for now. 6.  She continue on her vitamin D3 supplement.   Tests ordered Orders Placed This Encounter  Procedures  . DG Lumbar Spine Complete  . Ambulatory referral to Neurology      No orders of the defined types were placed in this encounter.   Patient to follow-up in 1 month or sooner as needed  Ailene Ards, NP

## 2019-12-31 NOTE — Telephone Encounter (Signed)
Refills were sent yesterday.

## 2020-01-05 ENCOUNTER — Telehealth (INDEPENDENT_AMBULATORY_CARE_PROVIDER_SITE_OTHER): Payer: Self-pay

## 2020-01-05 DIAGNOSIS — M1990 Unspecified osteoarthritis, unspecified site: Secondary | ICD-10-CM

## 2020-01-05 NOTE — Telephone Encounter (Signed)
Patient called and asked who she needs to see for her Osteoarthritis? Patient stated that she contacted her insurance and they gave her a number to a Dr. Tressia Miners and she said the number was no longer in service? Patient wanted to know who do you recommend for her to see?

## 2020-01-06 NOTE — Telephone Encounter (Signed)
I think Dr. Aline Brochure would be a good option. I will place referral order and let Nellie know. Thank you.

## 2020-01-06 NOTE — Telephone Encounter (Signed)
I have called this patient 2 times and it has gone to VM both times and the voice mail is full so I am unable to leave a message. I will try the patient at a later time.

## 2020-01-21 ENCOUNTER — Ambulatory Visit (INDEPENDENT_AMBULATORY_CARE_PROVIDER_SITE_OTHER): Payer: Medicare Other | Admitting: Cardiology

## 2020-01-21 ENCOUNTER — Encounter: Payer: Self-pay | Admitting: Cardiology

## 2020-01-21 ENCOUNTER — Other Ambulatory Visit: Payer: Self-pay

## 2020-01-21 VITALS — BP 148/82 | HR 56 | Ht 59.0 in | Wt 135.8 lb

## 2020-01-21 DIAGNOSIS — R42 Dizziness and giddiness: Secondary | ICD-10-CM

## 2020-01-21 DIAGNOSIS — R002 Palpitations: Secondary | ICD-10-CM | POA: Diagnosis not present

## 2020-01-21 DIAGNOSIS — I10 Essential (primary) hypertension: Secondary | ICD-10-CM

## 2020-01-21 NOTE — Progress Notes (Signed)
Cardiology Office Note  Date: 01/21/2020   ID: [name] [name], DOB 1940/06/16, MRN [id number]  PCP:  Ailene Ards, NP  Cardiologist:  Rozann Lesches, MD Electrophysiologist:  None   Chief Complaint  Patient presents with   Palpitations    History of Present Illness: [name] [name] is a 79 y.o. female referred for cardiology consultation by Ms. Pearline Cables NP for evaluation of intermittent dizziness.  We discussed her symptoms today.  She states that many years ago when she worked at [employer], she would intermittently experience a sense of rapid heartbeat.  The symptoms went away after she retired but in the last few months she has had a few episodes of fast heartbeat, one of which was associated with lightheadedness and nausea.  She has had no syncope.  She does report having significant psychosocial stressors during the time that this was going on, has not had any symptoms in the last several weeks.  She does not report any exertional chest pain.  I reviewed her medications which are outlined below.  She has recently undergone adjustments in therapy for better control of blood pressure.  I reviewed her echocardiogram and ECG from July as noted below.  Recent lab work also reviewed.  Past Medical History:  Diagnosis Date   Anxiety    Depression    GERD (gastroesophageal reflux disease)    Hypercholesteremia    Hypertension     Past Surgical History:  Procedure Laterality Date   BREAST REDUCTION SURGERY     CESAREAN SECTION     COLONOSCOPY  08/2005   ESOPHAGOGASTRODUODENOSCOPY N/A 08/19/2013   Procedure: ESOPHAGOGASTRODUODENOSCOPY (EGD);  Surgeon: Rogene Houston, MD;  [facility];  Service: Endoscopy;  Laterality: N/A;  11:15 - rescheduled to 7:30 - Ann notified pt   excision of submandibular gland     HEMORRHOID SURGERY     INTRAMEDULLARY (IM) NAIL INTERTROCHANTERIC Left 07/14/2018   Procedure: INTRAMEDULLARY (IM) NAIL, LEFT HIP;  Surgeon:  Renette Butters, MD;  Location: [place];  Service: Orthopedics;  Laterality: Left;   STOMACH SURGERY     years ago. ? reason   TONSILLECTOMY      Current Outpatient Medications  Medication Sig Dispense Refill   acetaminophen (TYLENOL) 650 MG CR tablet Take 650 mg by mouth every 8 (eight) hours as needed for pain.     alendronate (FOSAMAX) 70 MG tablet Take 1 tablet (70 mg total) by mouth once a week. Take with a full glass of water on an empty stomach. Give on Sunday 4 tablet 2   amLODipine (NORVASC) 2.5 MG tablet Take 1 tablet (2.5 mg total) by mouth daily. 90 tablet 1   Ascorbic Acid (VITAMIN C) 100 MG tablet Take 500 mg by mouth daily.     atenolol (TENORMIN) 50 MG tablet Take 1 tablet (50 mg total) by mouth daily. 90 tablet 1   Cholecalciferol (VITAMIN D-3) 125 MCG (5000 UT) TABS Take 2 tablets by mouth daily.     diphenhydrAMINE (BENADRYL) 25 mg capsule Take 25 mg by mouth every 6 (six) hours as needed.     escitalopram (LEXAPRO) 5 MG tablet Take 1 tablet (5 mg total) by mouth daily. 90 tablet 1   Omega-3 Fatty Acids (FISH OIL) 1000 MG CAPS Take 1,000 mg by mouth daily.     omeprazole (PRILOSEC) 20 MG capsule Take 20 mg by mouth daily.     tamsulosin (FLOMAX) 0.4 MG CAPS capsule Take 1 capsule (  0.4 mg total) by mouth 2 (two) times daily. 60 capsule 6   tiZANidine (ZANAFLEX) 2 MG tablet Take 1 tablet (2 mg total) by mouth every 8 (eight) hours as needed for muscle spasms. 90 tablet 1   triamcinolone cream (KENALOG) 0.1 % APPLY TO AFFECTED AREA TWICE DAILY 60 g 0   vitamin E 1000 UNIT capsule Take 1,000 Units by mouth daily.     No current facility-administered medications for this visit.   Allergies:  Morphine and related and Other   Social History: The patient  reports that she has never smoked. She has never used smokeless tobacco. She reports that she does not drink alcohol and does not use drugs.   Family History: The patient's family history includes Diabetes in  her son; Heart disease in her mother; Sleep apnea in her son.   ROS: No syncope.  Physical Exam: VS:  BP (!) 148/82 (BP Location: Right Arm)    Pulse (!) 56    Ht 4\' 11"  (1.499 m)    Wt 135 lb 12.8 oz (61.6 kg)    SpO2 99%    BMI 27.43 kg/m , BMI Body mass index is 27.43 kg/m.  Wt Readings from Last 3 Encounters:  01/21/20 135 lb 12.8 oz (61.6 kg)  12/30/19 131 lb 3.2 oz (59.5 kg)  11/18/19 127 lb 12.8 oz (58 kg)    General: Elderly woman, appears comfortable at rest. HEENT: Conjunctiva and lids normal, wearing a mask. Neck: Supple, no elevated JVP or carotid bruits, no thyromegaly. Lungs: Clear to auscultation, nonlabored breathing at rest. Cardiac: Regular rate and rhythm, no S3 or significant systolic murmur, no pericardial rub. Abdomen: Soft, nontender, bowel sounds present. Extremities: No pitting edema.  ECG:  An ECG dated July 2021 was personally reviewed today and demonstrated:  Sinus rhythm with PAC.  Recent Labwork: 09/11/2019: Hemoglobin 12.7; Platelets 219; TSH 1.50 11/18/2019: ALT 6; AST 16; BUN 32; Creat 1.57; Potassium 4.2; Sodium 136     Component Value Date/Time   CHOL 145 07/03/2019 1447   TRIG 69 07/03/2019 1447   HDL 67 07/03/2019 1447   CHOLHDL 2.2 07/03/2019 1447   LDLCALC 63 07/03/2019 1447    Other Studies Reviewed Today:  Echocardiogram 09/16/2019: 1. Left ventricular ejection fraction, by estimation, is 60 to 65%. The  left ventricle has normal function. The left ventricle has no regional  wall motion abnormalities. Left ventricular diastolic parameters are  consistent with Grade I diastolic  dysfunction (impaired relaxation).  2. Right ventricular systolic function is normal. The right ventricular  size is normal. There is normal pulmonary artery systolic pressure. The  estimated right ventricular systolic pressure is 34.2 mmHg.  3. Left atrial size was mildly dilated.  4. The mitral valve is abnormal, mildly calcified and with moderate    annular calcification. Mild mitral valve regurgitation.  5. Tricuspid valve regurgitation is mild to moderate.  6. The aortic valve is tricuspid. Aortic valve regurgitation is not  visualized.  7. The inferior vena cava is normal in size with greater than 50%  respiratory variability, suggesting right atrial pressure of 3 mmHg.   Assessment and Plan:  1.  Episodic palpitations as noted above, no associated syncope.  ECG shows normal intervals with single PAC as of July.  She could be experiencing paroxysmal arrhythmia such as PSVT, although has had no symptoms whatsoever in the last several weeks.  For now we will continue atenolol and observation.  If symptoms increase in frequency, outpatient cardiac  monitoring can be pursued.  LVEF normal by echocardiogram.  2.  Essential hypertension, blood pressure is mildly elevated today.  She is on atenolol and Norvasc.  Keep follow-up with PCP.  Medication Adjustments/Labs and Tests Ordered: Current medicines are reviewed at length with the patient today.  Concerns regarding medicines are outlined above.   Tests Ordered: No orders of the defined types were placed in this encounter.   Medication Changes: No orders of the defined types were placed in this encounter.   Disposition:  Follow up 6 months in the [place] office.  Signed, Satira Sark, MD, [facility] 01/21/2020 2:14 PM    [facility] at [facility] [address] [place], [place] [address] Phone: [phone]; Fax: [phone]

## 2020-01-21 NOTE — Patient Instructions (Signed)
Medication Instructions:  °Your physician recommends that you continue on your current medications as directed. Please refer to the Current Medication list given to you today. ° °*If you need a refill on your cardiac medications before your next appointment, please call your pharmacy* ° ° °Lab Work: °None today °If you have labs (blood work) drawn today and your tests are completely normal, you will receive your results only by: °• MyChart Message (if you have MyChart) OR °• A paper copy in the mail °If you have any lab test that is abnormal or we need to change your treatment, we will call you to review the results. ° ° °Testing/Procedures: °None today ° ° °Follow-Up: °At [facility], you and your health needs are our priority.  As part of our continuing mission to provide you with exceptional heart care, we have created designated Provider Care Teams.  These Care Teams include your primary Cardiologist (physician) and Advanced Practice Providers (APPs -  Physician Assistants and Nurse Practitioners) who all work together to provide you with the care you need, when you need it. ° °We recommend signing up for the patient portal called "MyChart".  Sign up information is provided on this After Visit Summary.  MyChart is used to connect with patients for Virtual Visits (Telemedicine).  Patients are able to view lab/test results, encounter notes, upcoming appointments, etc.  Non-urgent messages can be sent to your provider as well.   °To learn more about what you can do with MyChart, go to [email or web address].   ° °Your next appointment:   °6 month(s) ° °The format for your next appointment:   °In Person ° °Provider:   °Samuel McDowell, MD ° ° °Other Instructions °None ° ° ° ° °Thank you for choosing [facility] ! ° ° ° ° ° ° ° ° °

## 2020-01-22 ENCOUNTER — Ambulatory Visit: Payer: Medicare Other | Admitting: Orthopaedic Surgery

## 2020-01-24 ENCOUNTER — Other Ambulatory Visit (INDEPENDENT_AMBULATORY_CARE_PROVIDER_SITE_OTHER): Payer: Self-pay | Admitting: Internal Medicine

## 2020-02-03 ENCOUNTER — Other Ambulatory Visit: Payer: Self-pay

## 2020-02-03 ENCOUNTER — Ambulatory Visit: Payer: Medicare Other

## 2020-02-03 ENCOUNTER — Encounter: Payer: Self-pay | Admitting: Orthopaedic Surgery

## 2020-02-03 ENCOUNTER — Ambulatory Visit (INDEPENDENT_AMBULATORY_CARE_PROVIDER_SITE_OTHER): Payer: Medicare Other | Admitting: Orthopaedic Surgery

## 2020-02-03 VITALS — BP 161/91 | HR 94 | Ht 59.0 in | Wt 131.0 lb

## 2020-02-03 DIAGNOSIS — M25561 Pain in right knee: Secondary | ICD-10-CM

## 2020-02-03 DIAGNOSIS — G8929 Other chronic pain: Secondary | ICD-10-CM

## 2020-02-03 DIAGNOSIS — M25562 Pain in left knee: Secondary | ICD-10-CM | POA: Diagnosis not present

## 2020-02-03 NOTE — Progress Notes (Signed)
Subjective:    Patient ID: [name] [name], female    DOB: 1940-08-04, 79 y.o.   MRN: [id number]  HPI She fell about six weeks ago and hurt her knees and back.  Her back is much improved but her knees hurt.  Last year she had fracture of the left hip treated in [place].  She has done well from that and the hip does not hurt.  She has swelling of both knees, the pain will vary from one knee to the other depending on her activity and weather.  She has tried ice, heat with no help. She has seen her family doctor and I have reviewed the notes.  She has not improved and is concerned.   Review of Systems  Constitutional: Positive for activity change.  Musculoskeletal: Positive for arthralgias, gait problem and joint swelling.  All other systems reviewed and are negative.  For Review of Systems, all other systems reviewed and are negative.  The following is a summary of the past history medically, past history surgically, known current medicines, social history and family history.  This information is gathered electronically by the computer from prior information and documentation.  I review this each visit and have found including this information at this point in the chart is beneficial and informative.   Past Medical History:  Diagnosis Date  . Anxiety   . Depression   . GERD (gastroesophageal reflux disease)   . Hypercholesteremia   . Hypertension     Past Surgical History:  Procedure Laterality Date  . BREAST REDUCTION SURGERY    . CESAREAN SECTION    . COLONOSCOPY  08/2005  . ESOPHAGOGASTRODUODENOSCOPY N/A 08/19/2013   Procedure: ESOPHAGOGASTRODUODENOSCOPY (EGD);  Surgeon: Rogene Houston, MD;  [facility];  Service: Endoscopy;  Laterality: N/A;  11:15 - rescheduled to 7:30 - Ann notified pt  . excision of submandibular gland    . HEMORRHOID SURGERY    . INTRAMEDULLARY (IM) NAIL INTERTROCHANTERIC Left 07/14/2018   Procedure: INTRAMEDULLARY (IM) NAIL, LEFT HIP;   Surgeon: Renette Butters, MD;  Location: [place];  Service: Orthopedics;  Laterality: Left;  . STOMACH SURGERY     years ago. ? reason  . TONSILLECTOMY      Current Outpatient Medications on File Prior to Visit  Medication Sig Dispense Refill  . acetaminophen (TYLENOL) 650 MG CR tablet Take 650 mg by mouth every 8 (eight) hours as needed for pain.    Marland Kitchen alendronate (FOSAMAX) 70 MG tablet Take 1 tablet (70 mg total) by mouth once a week. Take with a full glass of water on an empty stomach. Give on Sunday 4 tablet 2  . amLODipine (NORVASC) 2.5 MG tablet Take 1 tablet (2.5 mg total) by mouth daily. 90 tablet 1  . Ascorbic Acid (VITAMIN C) 100 MG tablet Take 500 mg by mouth daily.    Marland Kitchen atenolol (TENORMIN) 50 MG tablet Take 1 tablet (50 mg total) by mouth daily. 90 tablet 1  . celecoxib (CELEBREX) 200 MG capsule TAKE ONE CAPSULE BY MOUTH DAILY. 30 capsule 0  . Cholecalciferol (VITAMIN D-3) 125 MCG (5000 UT) TABS Take 2 tablets by mouth daily.    . diphenhydrAMINE (BENADRYL) 25 mg capsule Take 25 mg by mouth every 6 (six) hours as needed.    Marland Kitchen escitalopram (LEXAPRO) 5 MG tablet Take 1 tablet (5 mg total) by mouth daily. 90 tablet 1  . esomeprazole (NEXIUM) 40 MG capsule TAKE 1 CAPSULE BY MOUTH DAILY FOR ACID  REFLUX. 30 capsule 0  . Omega-3 Fatty Acids (FISH OIL) 1000 MG CAPS Take 1,000 mg by mouth daily.    Marland Kitchen omeprazole (PRILOSEC) 20 MG capsule Take 20 mg by mouth daily.    . tamsulosin (FLOMAX) 0.4 MG CAPS capsule Take 1 capsule (0.4 mg total) by mouth 2 (two) times daily. 60 capsule 6  . tiZANidine (ZANAFLEX) 2 MG tablet Take 1 tablet (2 mg total) by mouth every 8 (eight) hours as needed for muscle spasms. 90 tablet 1  . triamcinolone cream (KENALOG) 0.1 % APPLY TO AFFECTED AREA TWICE DAILY 60 g 0  . vitamin E 1000 UNIT capsule Take 1,000 Units by mouth daily.     No current facility-administered medications on file prior to visit.    Social History   Socioeconomic History  . Marital  status: Widowed    Spouse name: Not on file  . Number of children: Not on file  . Years of education: Not on file  . Highest education level: Not on file  Occupational History  . Not on file  Tobacco Use  . Smoking status: Never Smoker  . Smokeless tobacco: Never Used  Vaping Use  . Vaping Use: Never used  Substance and Sexual Activity  . Alcohol use: No    Alcohol/week: 0.0 standard drinks  . Drug use: No  . Sexual activity: Not Currently    Birth control/protection: Post-menopausal  Other Topics Concern  . Not on file  Social History Narrative   [place] with son.Retired.   Social Determinants of Health   Financial Resource Strain: Not on file  Food Insecurity: Not on file  Transportation Needs: Not on file  Physical Activity: Not on file  Stress: Not on file  Social Connections: Not on file  Intimate Partner Violence: Not on file    Family History  Problem Relation Age of Onset  . Heart disease Mother   . Sleep apnea Son   . Diabetes Son     BP (!) 161/91   Pulse 94   Ht 4\' 11"  (1.499 m)   Wt 131 lb (59.4 kg)   BMI 26.46 kg/m   Body mass index is 26.46 kg/m.      Objective:   Physical Exam Vitals and nursing note reviewed. Exam conducted with a chaperone present.  Constitutional:      Appearance: She is well-developed and well-nourished.  HENT:     Head: Normocephalic and atraumatic.  Eyes:     Extraocular Movements: EOM normal.     Conjunctiva/sclera: Conjunctivae normal.     Pupils: Pupils are equal, round, and reactive to light.  Cardiovascular:     Rate and Rhythm: Normal rate and regular rhythm.     Pulses: Intact distal pulses.  Pulmonary:     Effort: Pulmonary effort is normal.  Abdominal:     Palpations: Abdomen is soft.  Musculoskeletal:     Cervical back: Normal range of motion and neck supple.       Legs:  Skin:    General: Skin is warm and dry.  Neurological:     Mental Status: She is alert and oriented to person, place,  and time.     Cranial Nerves: No cranial nerve deficit.     Motor: No abnormal muscle tone.     Coordination: Coordination normal.     Deep Tendon Reflexes: Reflexes are normal and symmetric. Reflexes normal.  Psychiatric:        Mood and Affect: Mood and affect normal.  Behavior: Behavior normal.        Thought Content: Thought content normal.        Judgment: Judgment normal.   X-rays were done of both knees, reported separately.        Assessment & Plan:   Encounter Diagnoses  Name Primary?  . Chronic pain of right knee   . Chronic pain of left knee Yes   PROCEDURE NOTE:  The patient requests injections of the right knee , verbal consent was obtained.  The right knee was prepped appropriately after time out was performed.   Sterile technique was observed and injection of 1 cc of Depo-Medrol 40 mg with several cc's of plain xylocaine. Anesthesia was provided by ethyl chloride and a 20-gauge needle was used to inject the knee area. The injection was tolerated well.  A band aid dressing was applied.  The patient was advised to apply ice later today and tomorrow to the injection sight as needed.  PROCEDURE NOTE:  The patient requests injections of the left knee , verbal consent was obtained.  The left knee was prepped appropriately after time out was performed.   Sterile technique was observed and injection of 1 cc of Depo-Medrol 40 mg with several cc's of plain xylocaine. Anesthesia was provided by ethyl chloride and a 20-gauge needle was used to inject the knee area. The injection was tolerated well.  A band aid dressing was applied.  The patient was advised to apply ice later today and tomorrow to the injection sight as needed.  Return in one month.  Call if any problem.  Precautions discussed.   Electronically Signed Sanjuana Kava, MD 12/14/20213:46 PM

## 2020-02-04 ENCOUNTER — Institutional Professional Consult (permissible substitution): Payer: Medicare Other | Admitting: Neurology

## 2020-02-05 ENCOUNTER — Other Ambulatory Visit (INDEPENDENT_AMBULATORY_CARE_PROVIDER_SITE_OTHER): Payer: Self-pay | Admitting: Nurse Practitioner

## 2020-02-09 DIAGNOSIS — I129 Hypertensive chronic kidney disease with stage 1 through stage 4 chronic kidney disease, or unspecified chronic kidney disease: Secondary | ICD-10-CM | POA: Diagnosis not present

## 2020-02-09 DIAGNOSIS — F32A Depression, unspecified: Secondary | ICD-10-CM | POA: Diagnosis not present

## 2020-02-09 DIAGNOSIS — D509 Iron deficiency anemia, unspecified: Secondary | ICD-10-CM | POA: Diagnosis not present

## 2020-02-09 DIAGNOSIS — N1832 Chronic kidney disease, stage 3b: Secondary | ICD-10-CM | POA: Diagnosis not present

## 2020-02-12 ENCOUNTER — Ambulatory Visit (INDEPENDENT_AMBULATORY_CARE_PROVIDER_SITE_OTHER): Payer: Medicare Other | Admitting: Nurse Practitioner

## 2020-02-24 ENCOUNTER — Ambulatory Visit (INDEPENDENT_AMBULATORY_CARE_PROVIDER_SITE_OTHER): Payer: Medicare Other | Admitting: Nurse Practitioner

## 2020-03-01 ENCOUNTER — Other Ambulatory Visit (INDEPENDENT_AMBULATORY_CARE_PROVIDER_SITE_OTHER): Payer: Self-pay | Admitting: Nurse Practitioner

## 2020-03-01 ENCOUNTER — Other Ambulatory Visit (INDEPENDENT_AMBULATORY_CARE_PROVIDER_SITE_OTHER): Payer: Self-pay | Admitting: Internal Medicine

## 2020-03-01 DIAGNOSIS — I10 Essential (primary) hypertension: Secondary | ICD-10-CM

## 2020-03-02 ENCOUNTER — Ambulatory Visit: Payer: Medicare Other | Admitting: Orthopaedic Surgery

## 2020-03-11 ENCOUNTER — Ambulatory Visit (INDEPENDENT_AMBULATORY_CARE_PROVIDER_SITE_OTHER): Payer: Medicare Other | Admitting: Nurse Practitioner

## 2020-03-30 ENCOUNTER — Other Ambulatory Visit (INDEPENDENT_AMBULATORY_CARE_PROVIDER_SITE_OTHER): Payer: Self-pay | Admitting: Internal Medicine

## 2020-04-08 ENCOUNTER — Ambulatory Visit (INDEPENDENT_AMBULATORY_CARE_PROVIDER_SITE_OTHER): Payer: Medicare Other | Admitting: Nurse Practitioner

## 2020-04-21 ENCOUNTER — Ambulatory Visit (INDEPENDENT_AMBULATORY_CARE_PROVIDER_SITE_OTHER): Payer: Medicare Other | Admitting: Nurse Practitioner

## 2020-05-10 ENCOUNTER — Ambulatory Visit (INDEPENDENT_AMBULATORY_CARE_PROVIDER_SITE_OTHER): Payer: Medicare Other | Admitting: Internal Medicine

## 2020-05-12 ENCOUNTER — Other Ambulatory Visit (INDEPENDENT_AMBULATORY_CARE_PROVIDER_SITE_OTHER): Payer: Self-pay | Admitting: Internal Medicine

## 2020-05-12 ENCOUNTER — Other Ambulatory Visit (INDEPENDENT_AMBULATORY_CARE_PROVIDER_SITE_OTHER): Payer: Self-pay | Admitting: Nurse Practitioner

## 2020-05-12 DIAGNOSIS — M810 Age-related osteoporosis without current pathological fracture: Secondary | ICD-10-CM

## 2020-05-21 ENCOUNTER — Other Ambulatory Visit (INDEPENDENT_AMBULATORY_CARE_PROVIDER_SITE_OTHER): Payer: Self-pay | Admitting: Nurse Practitioner

## 2020-05-21 DIAGNOSIS — M62838 Other muscle spasm: Secondary | ICD-10-CM

## 2020-05-24 ENCOUNTER — Ambulatory Visit (INDEPENDENT_AMBULATORY_CARE_PROVIDER_SITE_OTHER): Payer: Medicare Other | Admitting: Internal Medicine

## 2020-05-24 ENCOUNTER — Encounter (INDEPENDENT_AMBULATORY_CARE_PROVIDER_SITE_OTHER): Payer: Self-pay | Admitting: Internal Medicine

## 2020-05-24 ENCOUNTER — Ambulatory Visit (HOSPITAL_COMMUNITY)
Admission: RE | Admit: 2020-05-24 | Discharge: 2020-05-24 | Disposition: A | Discharge status: Home / Self Care | Payer: Medicare Other | Source: Ambulatory Visit | Attending: Internal Medicine | Admitting: Internal Medicine

## 2020-05-24 ENCOUNTER — Other Ambulatory Visit: Payer: Self-pay

## 2020-05-24 VITALS — BP 130/82 | HR 104 | Temp 98.1°F | Ht 59.0 in | Wt 135.0 lb

## 2020-05-24 DIAGNOSIS — M810 Age-related osteoporosis without current pathological fracture: Secondary | ICD-10-CM | POA: Diagnosis not present

## 2020-05-24 DIAGNOSIS — M545 Low back pain, unspecified: Secondary | ICD-10-CM | POA: Insufficient documentation

## 2020-05-24 DIAGNOSIS — R112 Nausea with vomiting, unspecified: Secondary | ICD-10-CM | POA: Diagnosis not present

## 2020-05-24 DIAGNOSIS — I10 Essential (primary) hypertension: Secondary | ICD-10-CM | POA: Diagnosis not present

## 2020-05-24 DIAGNOSIS — E559 Vitamin D deficiency, unspecified: Secondary | ICD-10-CM | POA: Diagnosis not present

## 2020-05-24 NOTE — Progress Notes (Signed)
Metrics: Intervention Frequency ACO  Documented Smoking Status Yearly  Screened one or more times in 24 months  Cessation Counseling or  Active cessation medication Past 24 months  Past 24 months   Guideline developer: UpToDate (See UpToDate for funding source) Date Released: 2014       Wellness Office Visit  Subjective:  Patient ID: [name] [name], female    DOB: 12-30-1940  Age: 80 y.o. MRN: [id number]  CC: This lady comes in for follow-up of hypertension, vitamin D deficiency, osteoporosis and chronic low back pain which has acute exacerbations. HPI  [name] [name] did order a lumbar spine x-ray but she never got it done. She has been taking vitamin D3 supplementation apparently for vitamin D deficiency. She continues with amlodipine and atenolol for hypertension. She continues with Fosamax for her osteoporosis. She tells me that when she overeats, and only when she overeats, she gets nausea and vomiting.  Otherwise, gastroesophageal reflux symptoms are managed/controlled with the use of Nexium. Past Medical History:  Diagnosis Date  . Anxiety   . Depression   . GERD (gastroesophageal reflux disease)   . Hypercholesteremia   . Hypertension    Past Surgical History:  Procedure Laterality Date  . BREAST REDUCTION SURGERY    . CESAREAN SECTION    . COLONOSCOPY  08/2005  . ESOPHAGOGASTRODUODENOSCOPY N/A 08/19/2013   Procedure: ESOPHAGOGASTRODUODENOSCOPY (EGD);  Surgeon: Rogene Houston, MD;  [facility];  Service: Endoscopy;  Laterality: N/A;  11:15 - rescheduled to 7:30 - Ann notified pt  . excision of submandibular gland    . HEMORRHOID SURGERY    . INTRAMEDULLARY (IM) NAIL INTERTROCHANTERIC Left 07/14/2018   Procedure: INTRAMEDULLARY (IM) NAIL, LEFT HIP;  Surgeon: Renette Butters, MD;  Location: [place];  Service: Orthopedics;  Laterality: Left;  . STOMACH SURGERY     years ago. ? reason  . TONSILLECTOMY       Family History  Problem Relation Age of Onset  . Heart  disease Mother   . Sleep apnea Son   . 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   Social History   Tobacco Use  . Smoking status: Never Smoker  . Smokeless tobacco: Never Used  Substance Use Topics  . Alcohol use: No    Alcohol/week: 0.0 standard drinks    Current Meds  Medication Sig  . acetaminophen (TYLENOL) 650 MG CR tablet Take 650 mg by mouth every 8 (eight) hours as needed for pain.  Marland Kitchen alendronate (FOSAMAX) 70 MG tablet TAKE 1 TABLET BY MOUTH ONCE WEEKLY. TAKE WITH GLASS OF WATER ON EMPTY STOMACH. GIVE ON SUNDAY.  Marland Kitchen amLODipine (NORVASC) 2.5 MG tablet Take 1 tablet (2.5 mg total) by mouth daily.  . Ascorbic Acid (VITAMIN C) 100 MG tablet Take 500 mg by mouth daily.  Marland Kitchen atenolol (TENORMIN) 50 MG tablet TAKE (1) TABLET BY MOUTH ONCE DAILY.  . celecoxib (CELEBREX) 200 MG capsule TAKE ONE CAPSULE BY MOUTH DAILY.  Marland Kitchen Cholecalciferol (VITAMIN D-3) 125 MCG (5000 UT) TABS Take 2 tablets by mouth daily.  . diphenhydrAMINE (BENADRYL) 25 mg capsule Take 25 mg by mouth every 6 (six) hours as needed.  Marland Kitchen escitalopram (LEXAPRO) 5 MG tablet Take 1 tablet (5 mg total) by mouth daily.  Marland Kitchen esomeprazole (NEXIUM) 40 MG capsule TAKE 1 CAPSULE BY MOUTH DAILY FOR ACID REFLUX.  Marland Kitchen Omega-3 Fatty Acids (FISH OIL) 1000 MG CAPS Take 1,000 mg by mouth daily.  Marland Kitchen  omeprazole (PRILOSEC) 20 MG capsule Take 20 mg by mouth daily.  . tamsulosin (FLOMAX) 0.4 MG CAPS capsule Take 1 capsule (0.4 mg total) by mouth 2 (two) times daily.  Marland Kitchen tiZANidine (ZANAFLEX) 2 MG tablet TAKE (1) TABLET BY MOUTH EVERY EIGHT HOURS AS NEEDED.  Marland Kitchen triamcinolone (KENALOG) 0.1 % APPLY TO AFFECTED AREA TWICE DAILY  . vitamin E 1000 UNIT capsule Take 1,000 Units by mouth daily.     [place] Office Visit from 05/24/2020 in [facility]  PHQ-9 Total Score 0      Objective:   Today's Vitals: BP 130/82   Pulse (!) 104   Temp 98.1 F (36.7 C) (Temporal)   Ht 4\' 11"   (1.499 m)   Wt 135 lb (61.2 kg)   SpO2 97%   BMI 27.27 kg/m  Vitals with BMI 05/24/2020 02/03/2020 01/21/2020  Height 4\' 11"  4\' 11"  -  Weight 135 lbs 131 lbs -  BMI 63.87 56.43 -  Systolic 329 518 841  Diastolic 82 91 82  Pulse 660 94 -     Physical Exam   Her blood pressure is well controlled.  No other new physical findings.    Assessment   1. Non-intractable vomiting with nausea, unspecified vomiting type   2. Hypertension, unspecified type   3. Vitamin D deficiency   4. Age-related osteoporosis without current pathological fracture   5. Acute bilateral low back pain, unspecified whether sciatica present       Tests ordered Orders Placed This Encounter  Procedures  . DG Lumbar Spine Complete  . CBC  . COMPLETE METABOLIC PANEL WITH GFR  . VITAMIN D 25 Hydroxy (Vit-D Deficiency, Fractures)     Plan: 1. As far as the vomiting, I have told her to try not to overeat. 2. Continue with amlodipine and atenolol for hypertension and we will check renal function. 3. Continue with vitamin D3 supplementation and we will check vitamin D levels as well as renal function. 4. I will order another lumbar spine x-ray regarding her back pain. 5. Follow-up as previously scheduled with [name] [name] at the end of May.   No orders of the defined types were placed in this encounter.   Doree Albee, MD

## 2020-05-25 ENCOUNTER — Other Ambulatory Visit (INDEPENDENT_AMBULATORY_CARE_PROVIDER_SITE_OTHER): Payer: Self-pay | Admitting: Internal Medicine

## 2020-05-25 DIAGNOSIS — M545 Low back pain, unspecified: Secondary | ICD-10-CM

## 2020-05-25 LAB — COMPLETE METABOLIC PANEL WITH GFR
AG Ratio: 1.5 (calc) (ref 1.0–2.5)
ALT: 10 U/L (ref 6–29)
AST: 19 U/L (ref 10–35)
Albumin: 4.1 g/dL (ref 3.6–5.1)
Alkaline phosphatase (APISO): 42 U/L (ref 37–153)
BUN/Creatinine Ratio: 21 (calc) (ref 6–22)
BUN: 33 mg/dL — ABNORMAL HIGH (ref 7–25)
CO2: 25 mmol/L (ref 20–32)
Calcium: 9 mg/dL (ref 8.6–10.4)
Chloride: 100 mmol/L (ref 98–110)
Creat: 1.54 mg/dL — ABNORMAL HIGH (ref 0.60–0.88)
GFR, Est African American: 37 mL/min/{1.73_m2} — ABNORMAL LOW (ref >=60)
GFR, Est Non African American: 32 mL/min/{1.73_m2} — ABNORMAL LOW (ref >=60)
Globulin: 2.7 g/dL (calc) (ref 1.9–3.7)
Glucose, Bld: 83 mg/dL (ref 65–139)
Potassium: 4.8 mmol/L (ref 3.5–5.3)
Sodium: 135 mmol/L (ref 135–146)
Total Bilirubin: 0.5 mg/dL (ref 0.2–1.2)
Total Protein: 6.8 g/dL (ref 6.1–8.1)

## 2020-05-25 LAB — CBC
HCT: 37.8 % (ref 35.0–45.0)
Hemoglobin: 12.4 g/dL (ref 11.7–15.5)
MCH: 30.8 pg (ref 27.0–33.0)
MCHC: 32.8 g/dL (ref 32.0–36.0)
MCV: 94 fL (ref 80.0–100.0)
MPV: 11.2 fL (ref 7.5–12.5)
Platelets: 204 10*3/uL (ref 140–400)
RBC: 4.02 10*6/uL (ref 3.80–5.10)
RDW: 11.7 % (ref 11.0–15.0)
WBC: 8.8 10*3/uL (ref 3.8–10.8)

## 2020-05-25 LAB — VITAMIN D 25 HYDROXY (VIT D DEFICIENCY, FRACTURES): Vit D, 25-Hydroxy: 51 ng/mL (ref 30–100)

## 2020-05-25 NOTE — Progress Notes (Signed)
Please call this patient.  Let her know that her blood work is stable but make sure that she drinks plenty of water every day.  Vitamin D levels are in a good range so continue with same dose of vitamin D3.  Follow-up as scheduled.

## 2020-05-25 NOTE — Progress Notes (Signed)
Call this patient and let her know that the lumbar spine x-ray is abnormal especially L2/L3.  I would recommend that we proceed with MRI lumbar spine if she is agreeable.  Let me know.

## 2020-06-08 ENCOUNTER — Ambulatory Visit (HOSPITAL_COMMUNITY): Payer: Medicare Other

## 2020-06-12 IMAGING — CT CT HEAD WITHOUT CONTRAST
3 series · 15 of 47 positions shown, 18 images · non-contrast
Comparison: 04/28/2012

CLINICAL DATA: Fall with head trauma.

EXAM:
CT HEAD WITHOUT CONTRAST
TECHNIQUE: Contiguous axial images were obtained from the base of the skull
through the vertex without intravenous contrast.

[Series 2: head wo · axial · 0.47mm/px · z∈[+1416,+1540]mm · 9 of 30 slices shown, 12 images]
[im 3/30  brain]
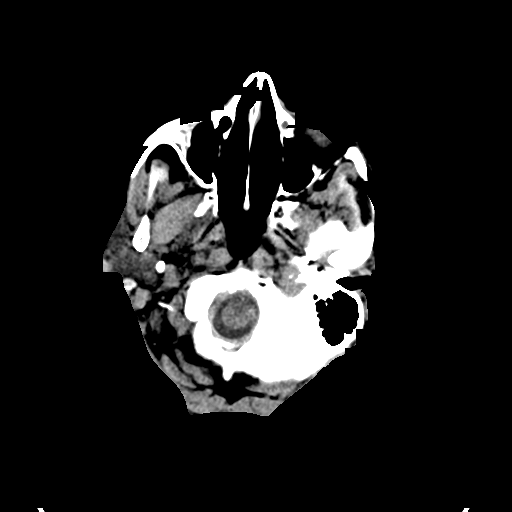
[im 3/30  bone]
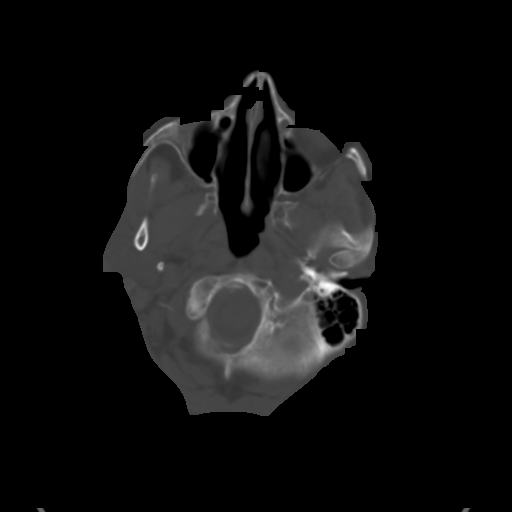
[im 6/30  brain]
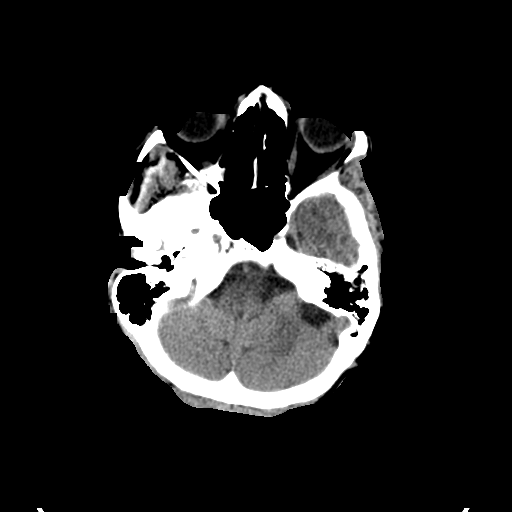
[im 9/30  brain]
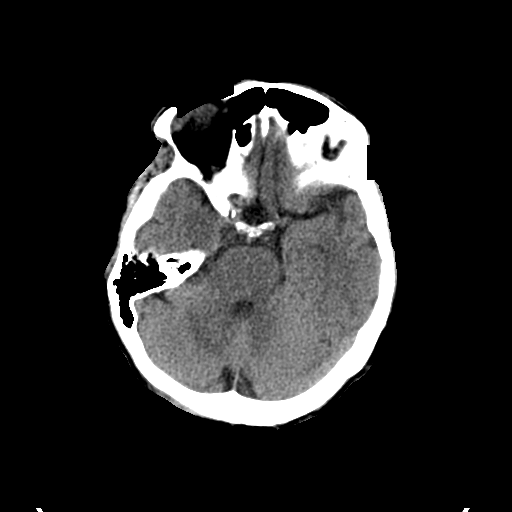
[im 12/30  brain]
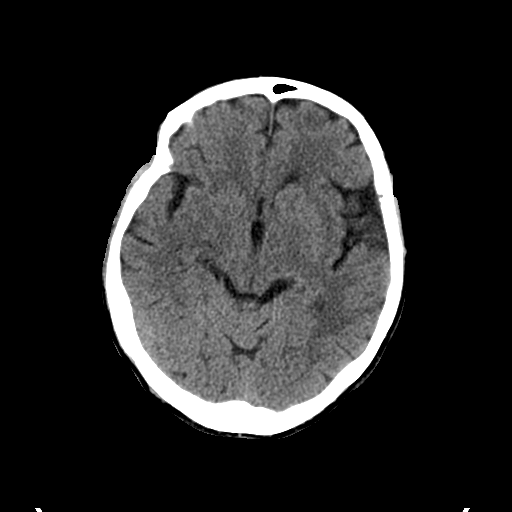
[im 16/30  brain]
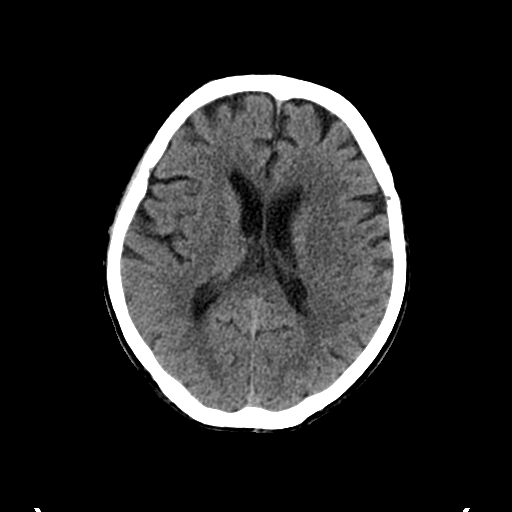
[im 16/30  bone]
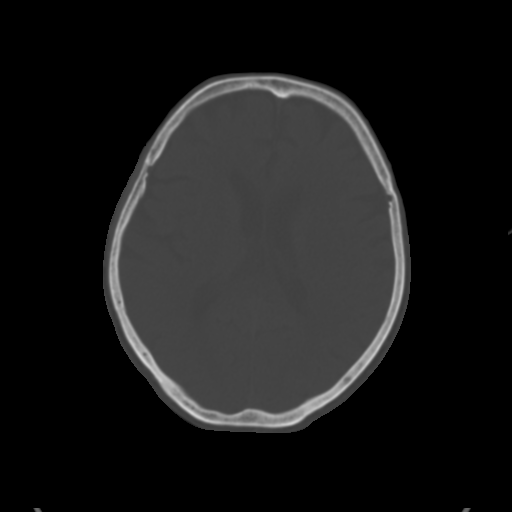
[im 19/30  brain]
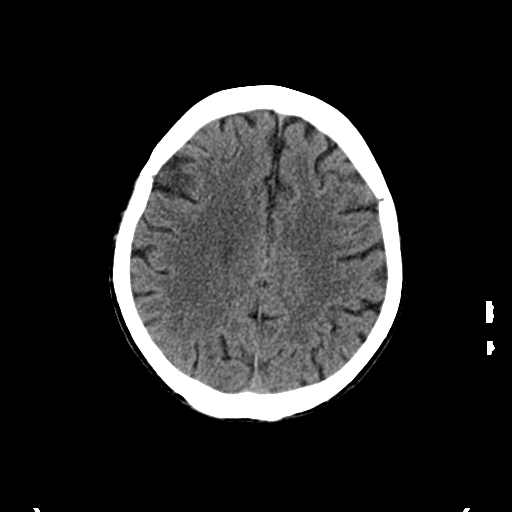
[im 22/30  brain]
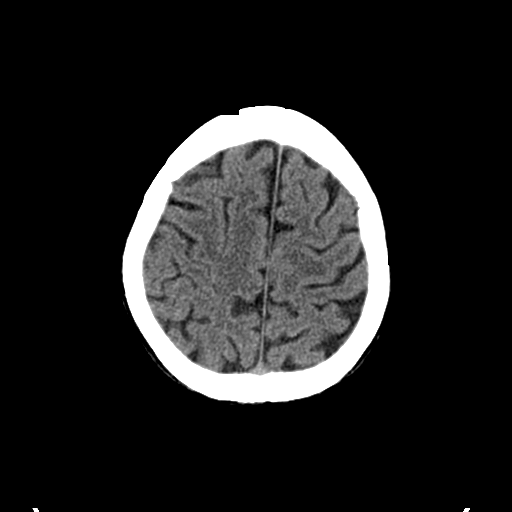
[im 25/30  brain]
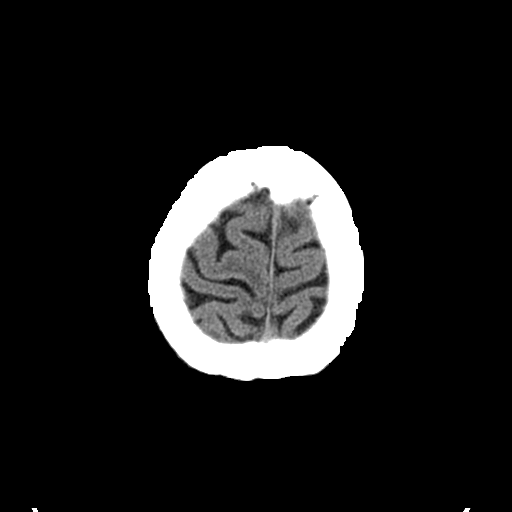
[im 28/30  brain]
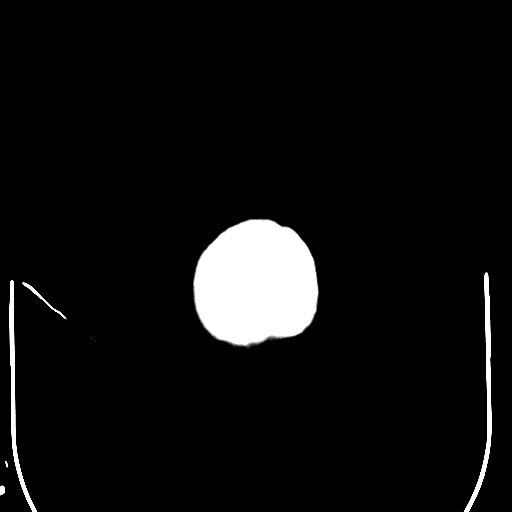
[im 28/30  bone]
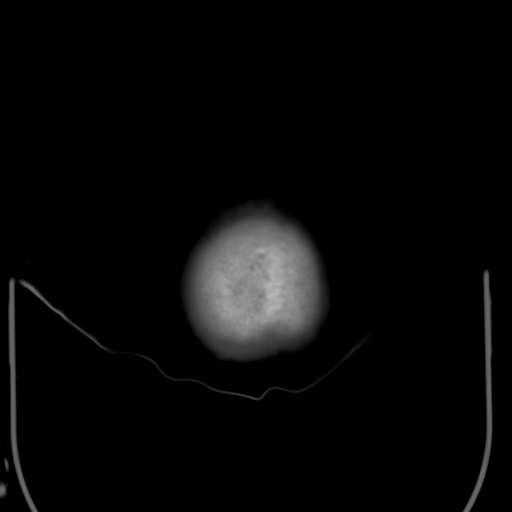

[Series 5: coronal soft tissue · coronal · 0.29mm/px · 3 of 71 slices shown]
[im 24/71  brain]
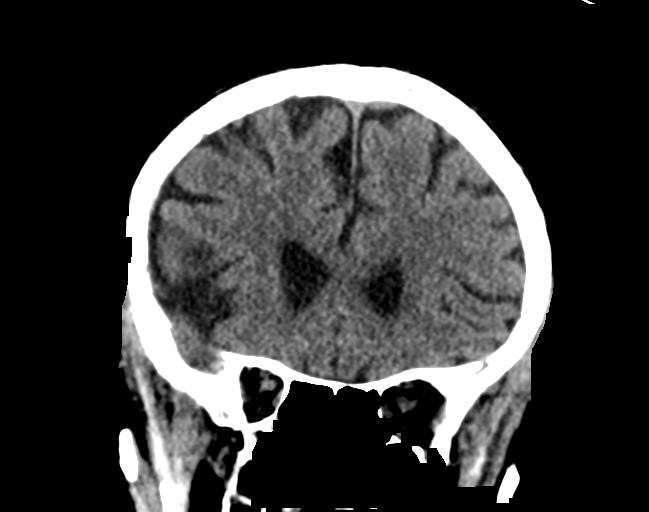
[im 32/71  brain]
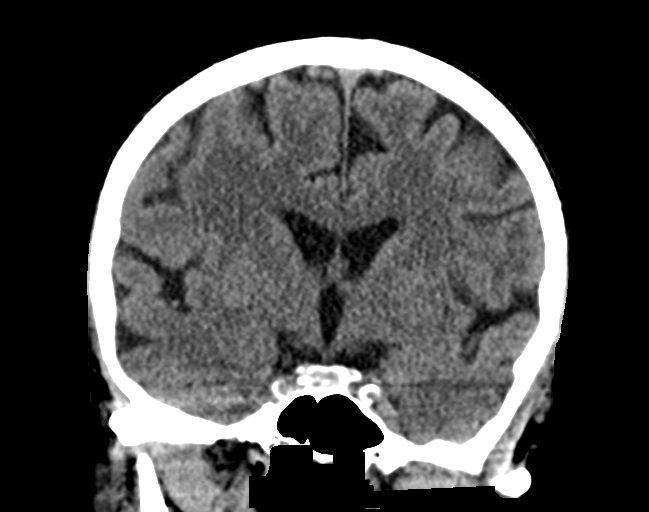
[im 39/71  brain]
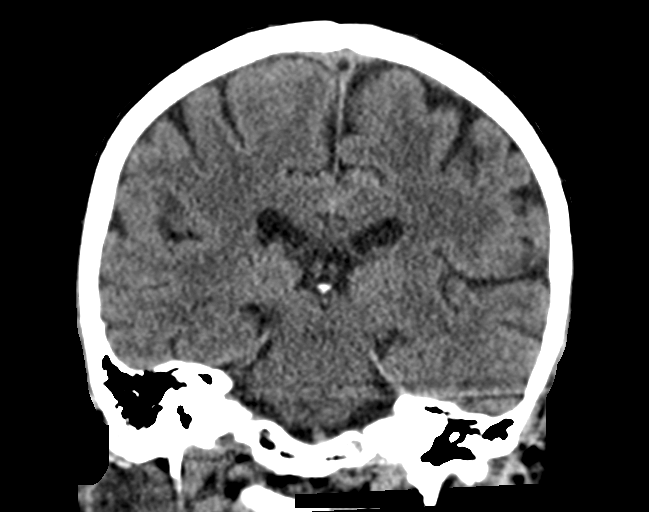

[Series 6: sagittal soft tissue · sagittal · 0.29mm/px · 3 of 55 slices shown]
[im 19/55  brain]
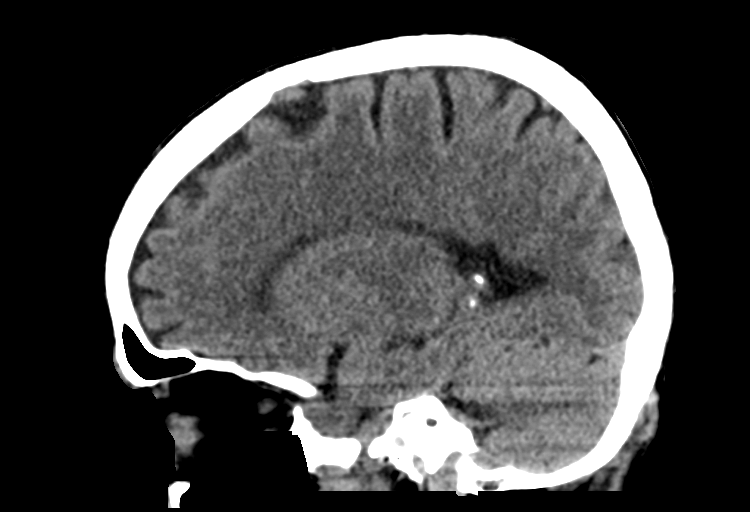
[im 28/55  brain]
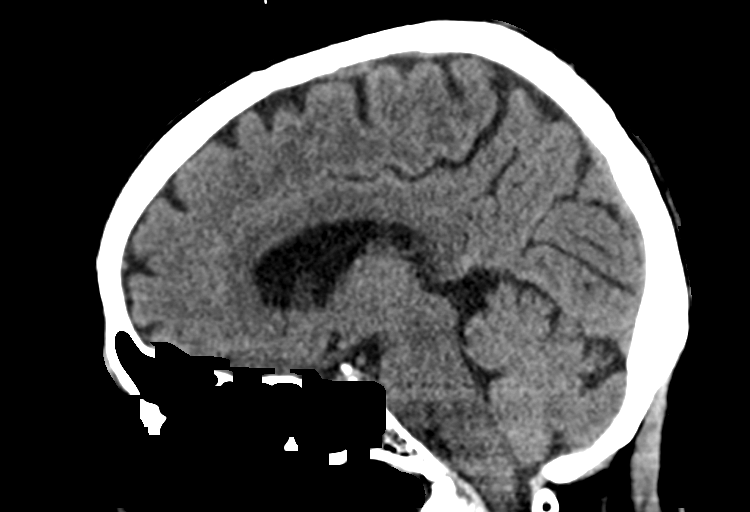
[im 37/55  brain]
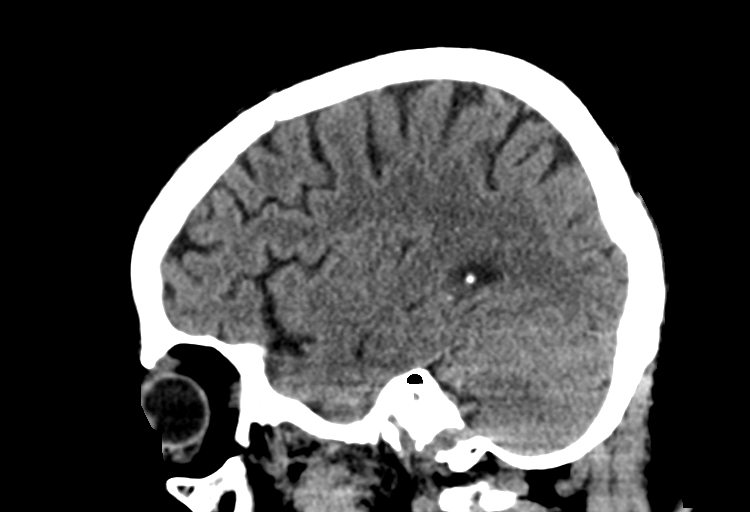

[15 of 47 positions shown; findings below may reference images not displayed]

FINDINGS: Brain: There is no evidence for acute hemorrhage, hydrocephalus,
mass lesion, or abnormal extra-axial fluid collection. No definite
CT evidence for acute infarction.

Vascular: No hyperdense vessel or unexpected calcification.

Skull: No evidence for fracture. No worrisome lytic or sclerotic
lesion.

Sinuses/Orbits: The visualized paranasal sinuses and mastoid air
cells are clear. Visualized portions of the globes and intraorbital
fat are unremarkable.

Other: None.
IMPRESSION: 1. Stable exam.  No acute intracranial abnormality.

## 2020-06-12 IMAGING — CR CHEST - 2 VIEW
2 series · 2 of 2 positions shown · non-contrast
Comparison: 03/05/2014, CT from 05/26/2017

CLINICAL DATA: Recent fall with known left femoral fracture

EXAM:
CHEST - 2 VIEW

[w chest lat]
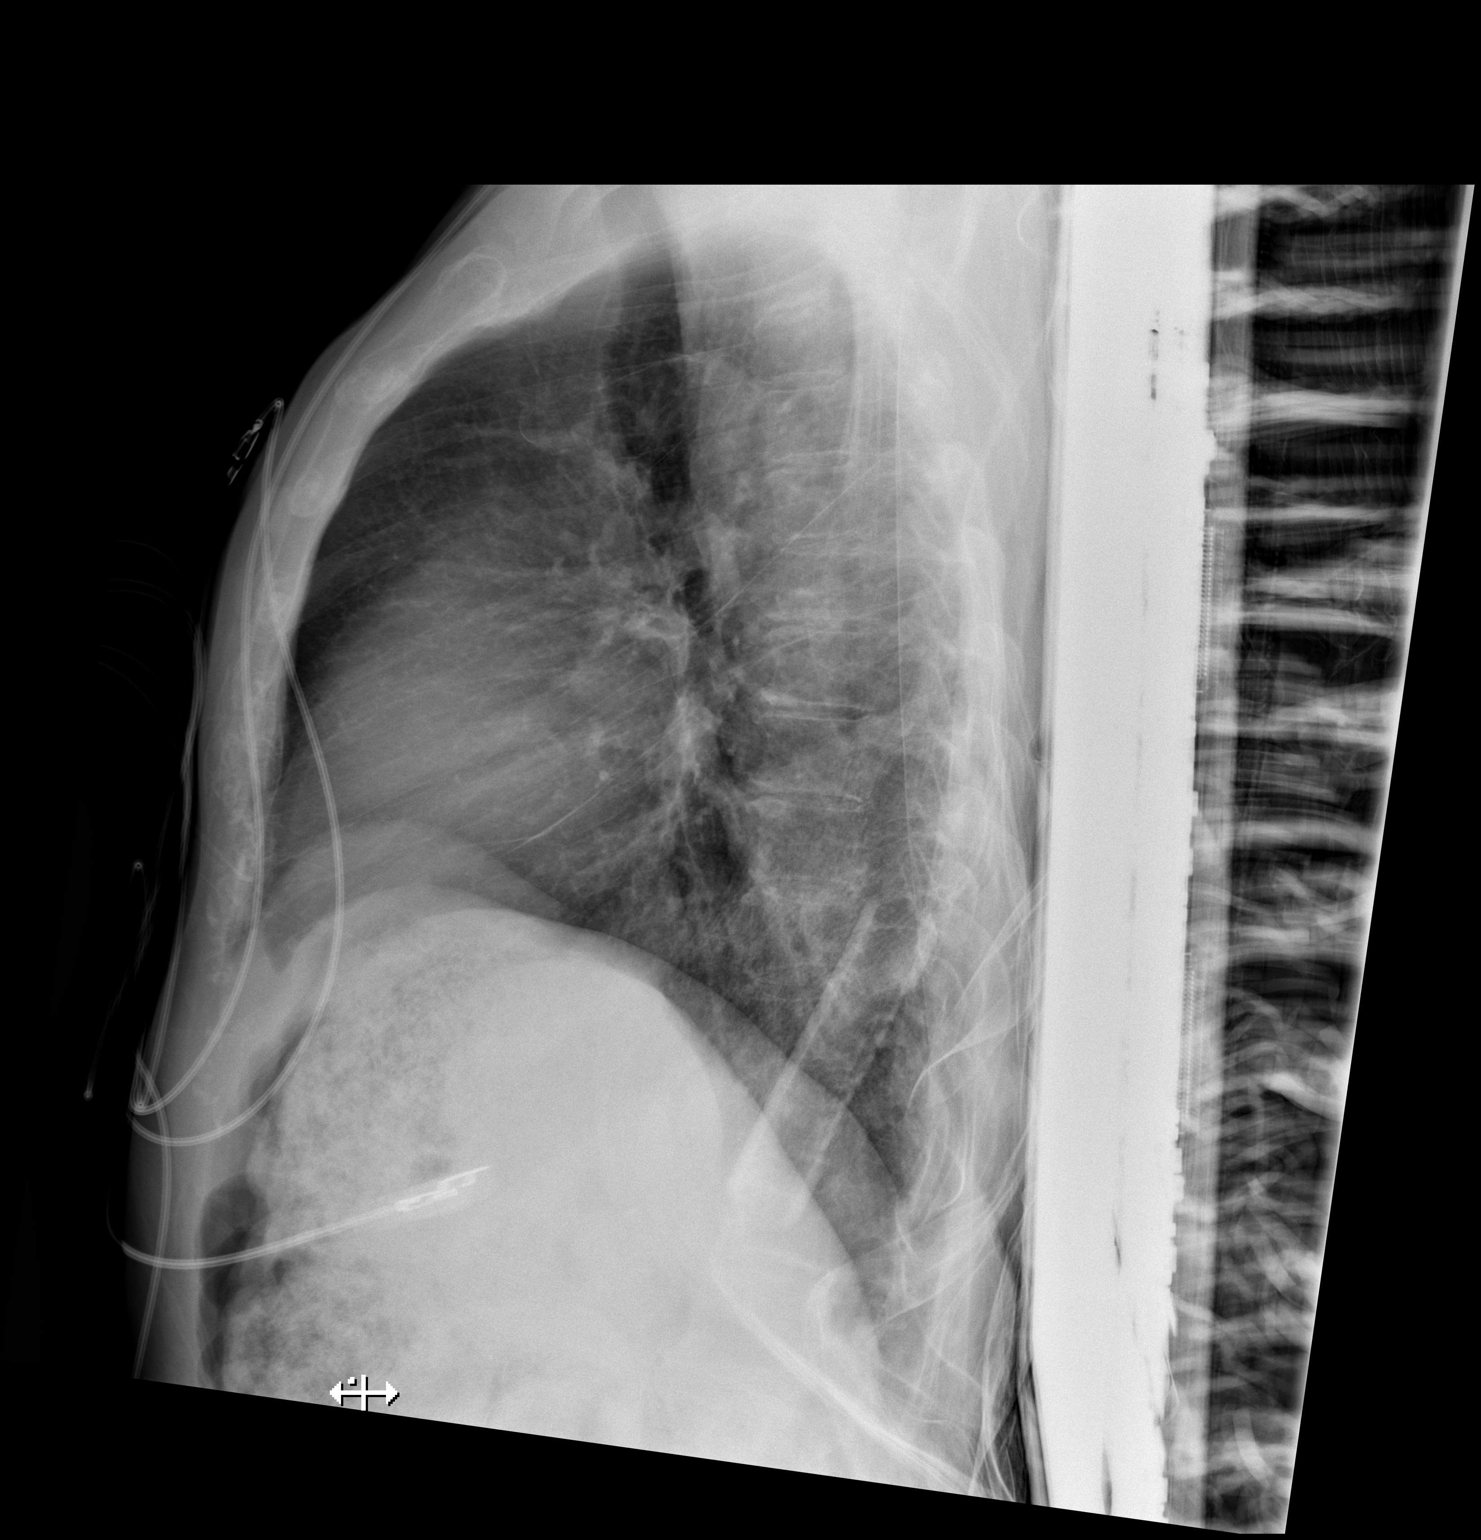

[x chest ap]
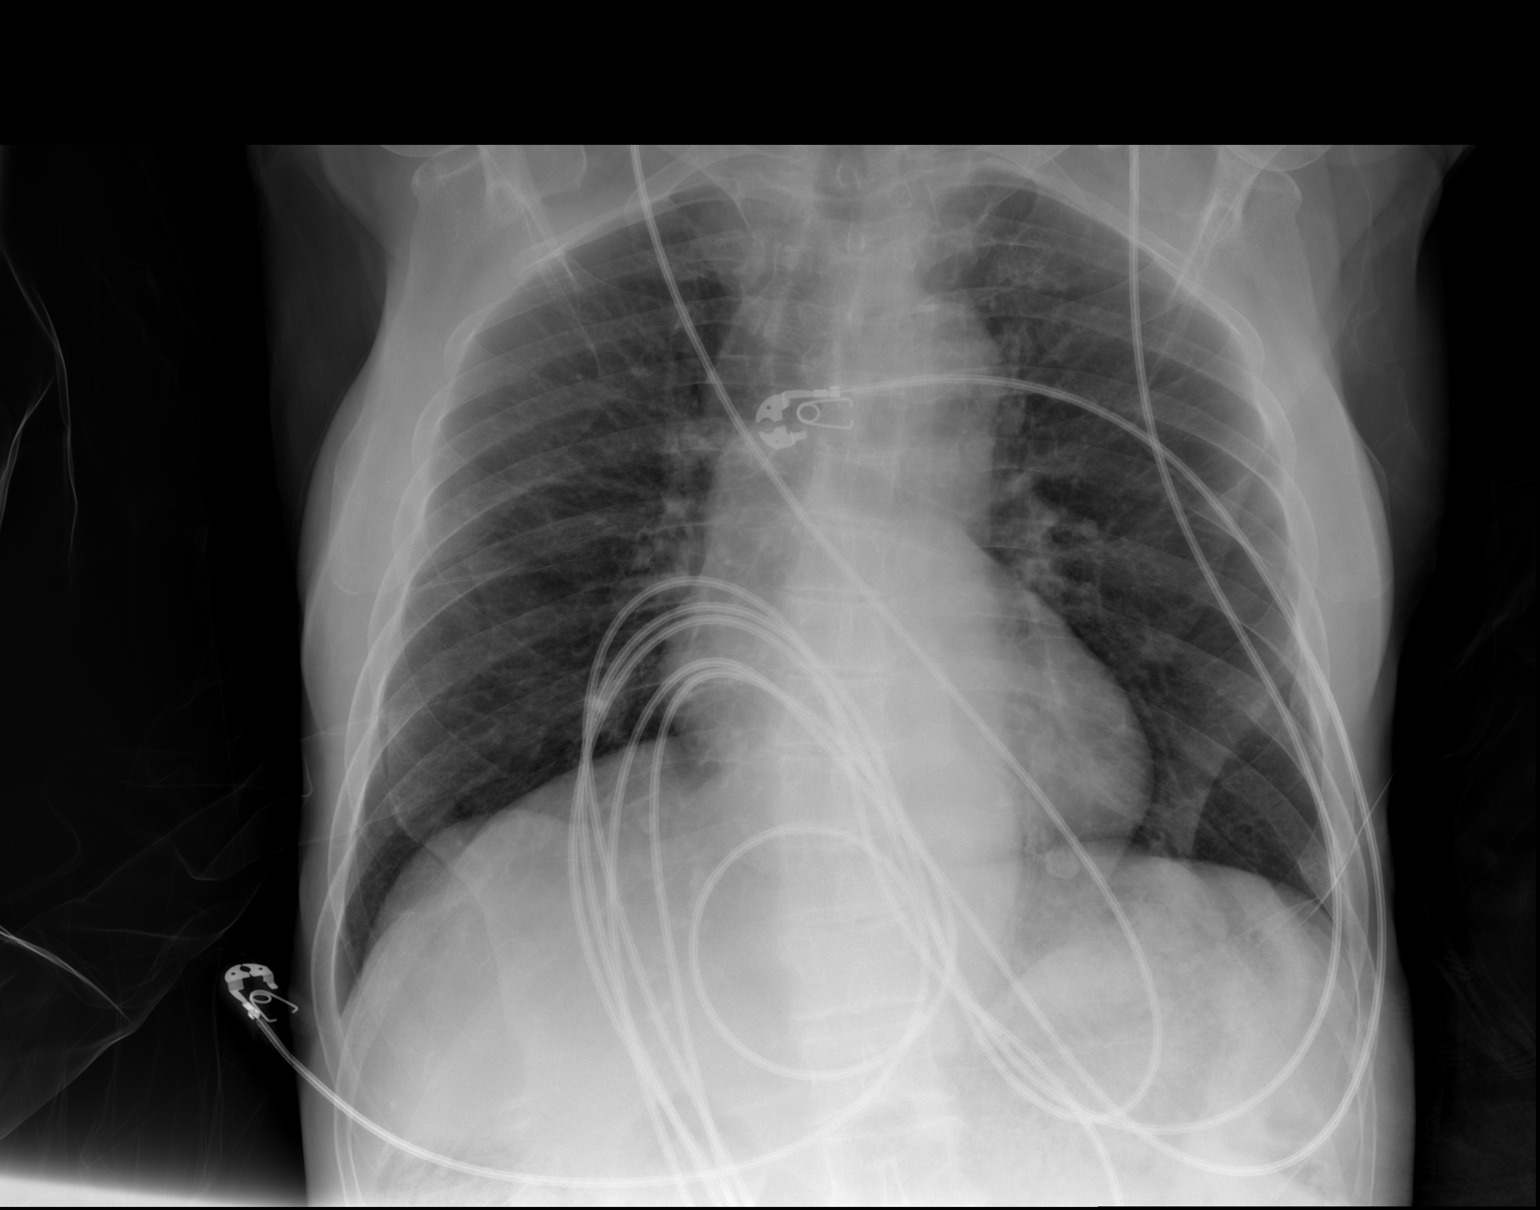

[2 of 2 positions shown; findings below may reference images not displayed]

FINDINGS: Cardiac shadow is within normal limits. Small hiatal hernia is again
noted. Lungs are well aerated bilaterally. No focal infiltrate or
effusion is seen. No pneumothorax is noted. No acute bony
abnormality is seen.
IMPRESSION: Small sliding-type hiatal hernia.  No acute abnormality noted.

## 2020-06-12 IMAGING — CR DG HIP (WITH OR WITHOUT PELVIS) 2-3V LEFT
3 series · 3 of 3 positions shown · non-contrast
Comparison: None.

CLINICAL DATA: Recent fall with left hip pain, initial encounter

EXAM:
DG HIP (WITH OR WITHOUT PELVIS) 3V LEFT

[x pelvis]
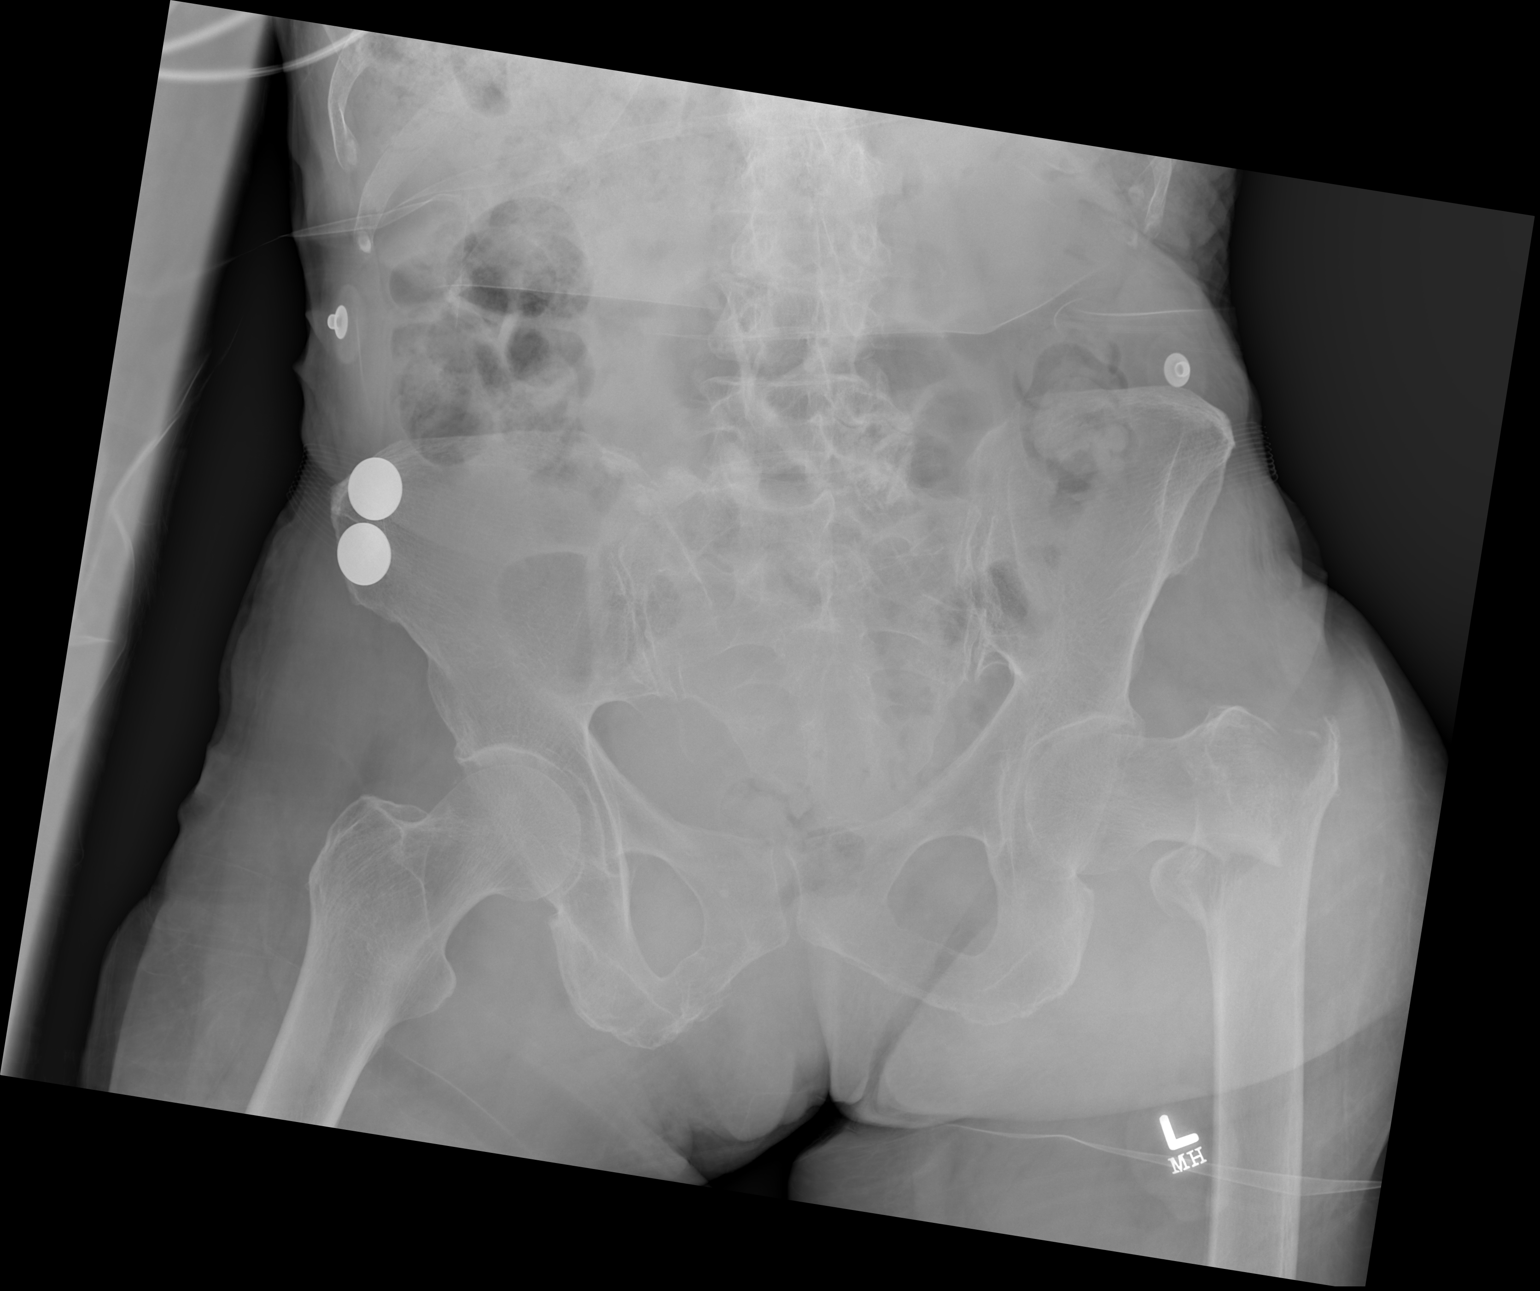

[x hip ap left]
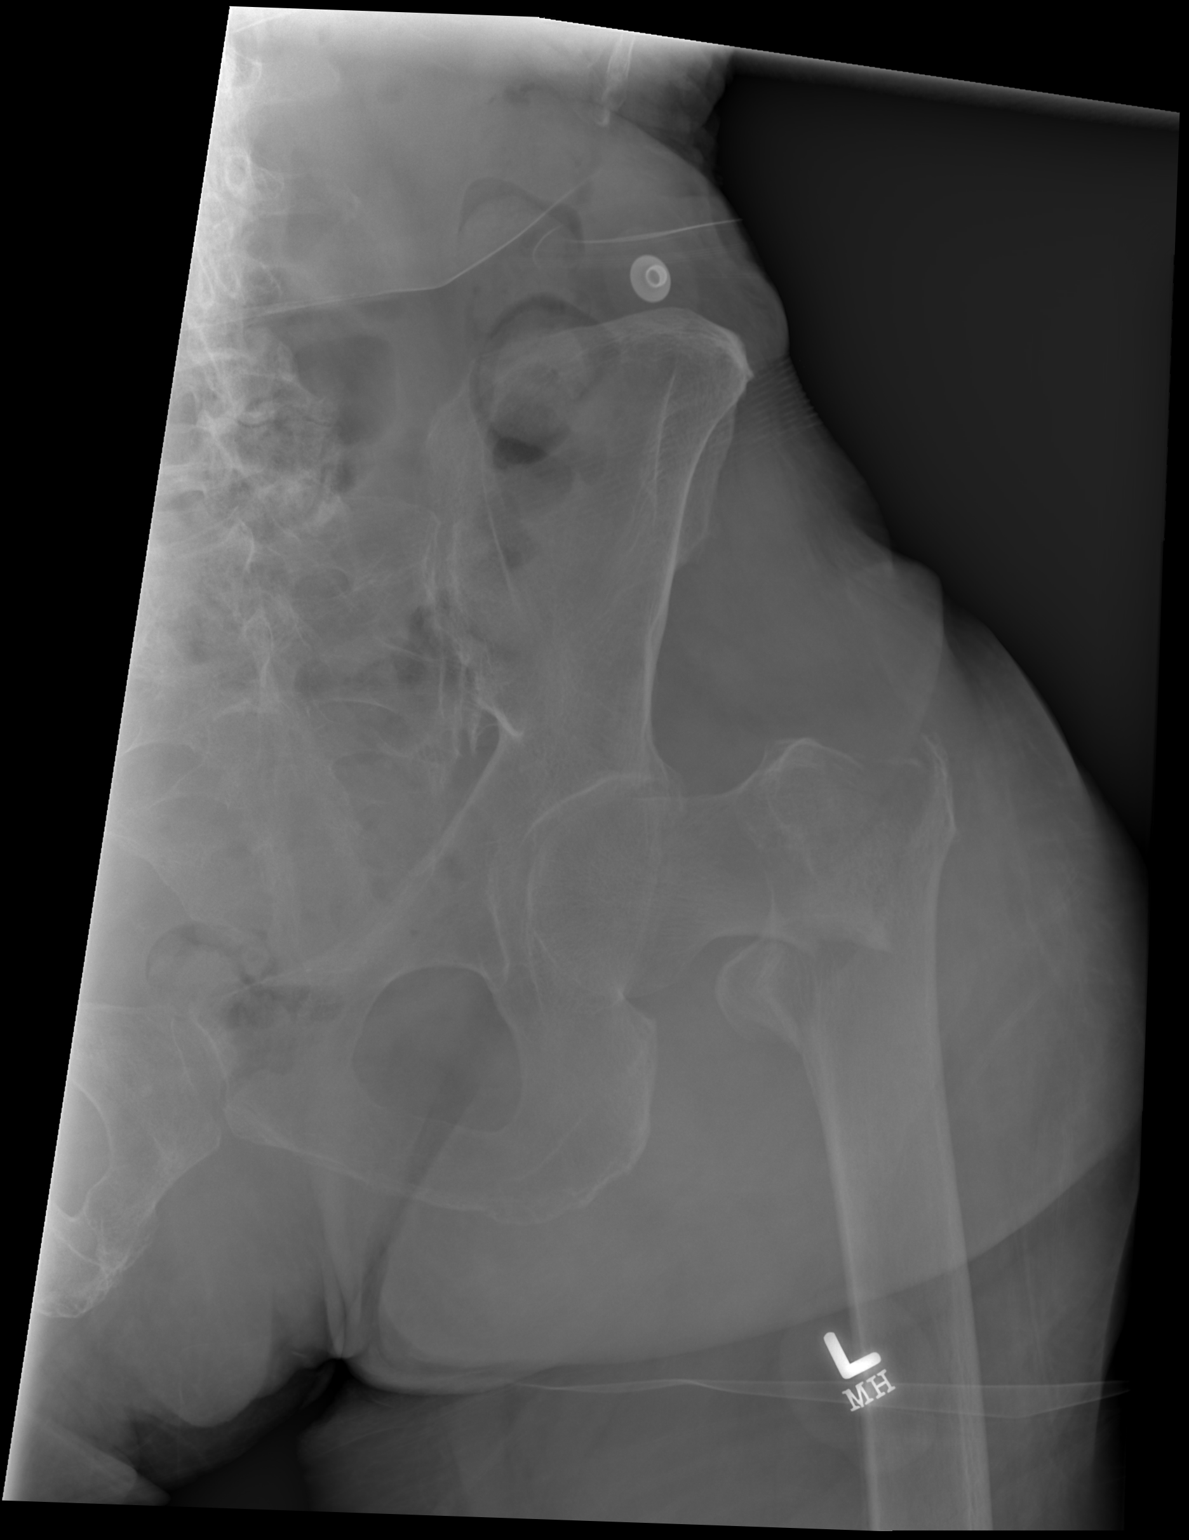

[w hip lat left]
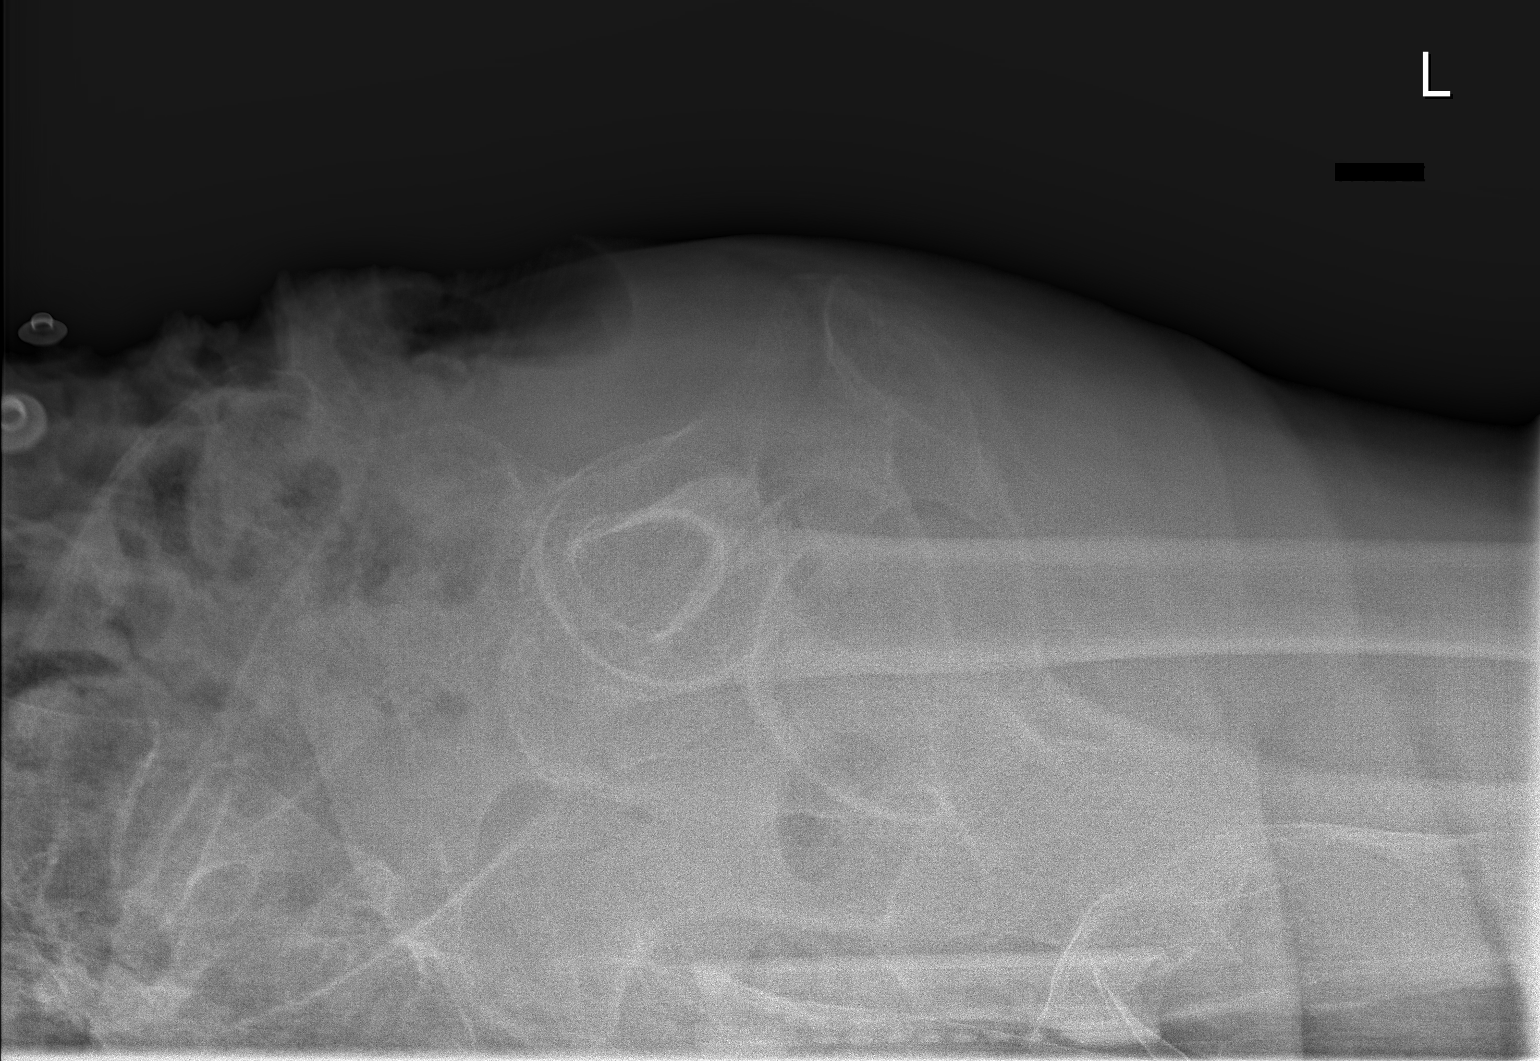

[3 of 3 positions shown; findings below may reference images not displayed]

FINDINGS: Comminuted intratrochanteric left femoral fracture is noted with
impaction at the fracture site. Femoral head is well seated. Pelvic
ring is intact. No soft tissue abnormality is noted.
IMPRESSION: Comminuted intratrochanteric left femoral fracture.

## 2020-06-21 ENCOUNTER — Ambulatory Visit (INDEPENDENT_AMBULATORY_CARE_PROVIDER_SITE_OTHER): Payer: Medicare Other | Admitting: Internal Medicine

## 2020-06-21 ENCOUNTER — Other Ambulatory Visit: Payer: Self-pay

## 2020-06-21 ENCOUNTER — Encounter (INDEPENDENT_AMBULATORY_CARE_PROVIDER_SITE_OTHER): Payer: Self-pay | Admitting: Internal Medicine

## 2020-06-21 VITALS — BP 150/86 | HR 53 | Temp 97.9°F | Ht 59.0 in | Wt 136.8 lb

## 2020-06-21 DIAGNOSIS — S0990XA Unspecified injury of head, initial encounter: Secondary | ICD-10-CM | POA: Diagnosis not present

## 2020-06-21 DIAGNOSIS — N3941 Urge incontinence: Secondary | ICD-10-CM

## 2020-06-21 MED ORDER — ESOMEPRAZOLE MAGNESIUM 40 MG PO CPDR: DELAYED_RELEASE_CAPSULE | ORAL | 2 refills | Status: DC

## 2020-06-21 MED ORDER — TAMSULOSIN HCL 0.4 MG PO CAPS: 0.4000 mg | ORAL_CAPSULE | Freq: Two times a day (BID) | ORAL | 6 refills | Status: DC

## 2020-06-21 MED ORDER — TRIAMCINOLONE ACETONIDE 0.1 % EX CREA: TOPICAL_CREAM | CUTANEOUS | 0 refills | Status: DC

## 2020-06-21 MED ORDER — MELOXICAM 7.5 MG PO TABS: 7.5000 mg | ORAL_TABLET | Freq: Every day | ORAL | 3 refills | Status: AC

## 2020-06-21 NOTE — Progress Notes (Signed)
Metrics: Intervention Frequency ACO  Documented Smoking Status Yearly  Screened one or more times in 24 months  Cessation Counseling or  Active cessation medication Past 24 months  Past 24 months   Guideline developer: UpToDate (See UpToDate for funding source) Date Released: 2014       Wellness Office Visit  Subjective:  Patient ID: [name] [name], female    DOB: 1940/07/03  Age: 80 y.o. MRN: [id number]  CC: Right head injury HPI  This lady had an accidental fall yesterday and hit her fridge at 10 PM last night.  She never lost consciousness.  She felt slightly dizzy afterwards but the symptoms have now resolved.  She denies any confusion, difficulty with speech, limb weakness, visual changes.  She did have some blurry vision last night but this seems to have cleared up now.  The main symptom she has is pain on the right temple area. Past Medical History:  Diagnosis Date  . Anxiety   . Depression   . GERD (gastroesophageal reflux disease)   . Hypercholesteremia   . Hypertension    Past Surgical History:  Procedure Laterality Date  . BREAST REDUCTION SURGERY    . CESAREAN SECTION    . COLONOSCOPY  08/2005  . ESOPHAGOGASTRODUODENOSCOPY N/A 08/19/2013   Procedure: ESOPHAGOGASTRODUODENOSCOPY (EGD);  Surgeon: Rogene Houston, MD;  [facility];  Service: Endoscopy;  Laterality: N/A;  11:15 - rescheduled to 7:30 - Ann notified pt  . excision of submandibular gland    . HEMORRHOID SURGERY    . INTRAMEDULLARY (IM) NAIL INTERTROCHANTERIC Left 07/14/2018   Procedure: INTRAMEDULLARY (IM) NAIL, LEFT HIP;  Surgeon: Renette Butters, MD;  Location: [place];  Service: Orthopedics;  Laterality: Left;  . STOMACH SURGERY     years ago. ? reason  . TONSILLECTOMY       Family History  Problem Relation Age of Onset  . Heart disease Mother   . Sleep apnea Son   . 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   Social History    Tobacco Use  . Smoking status: Never Smoker  . Smokeless tobacco: Never Used  Substance Use Topics  . Alcohol use: No    Alcohol/week: 0.0 standard drinks    Current Meds  Medication Sig  . acetaminophen (TYLENOL) 650 MG CR tablet Take 650 mg by mouth every 8 (eight) hours as needed for pain.  Marland Kitchen alendronate (FOSAMAX) 70 MG tablet TAKE 1 TABLET BY MOUTH ONCE WEEKLY. TAKE WITH GLASS OF WATER ON EMPTY STOMACH. GIVE ON SUNDAY.  Marland Kitchen amLODipine (NORVASC) 2.5 MG tablet Take 1 tablet (2.5 mg total) by mouth daily.  . Ascorbic Acid (VITAMIN C) 100 MG tablet Take 500 mg by mouth daily.  . celecoxib (CELEBREX) 200 MG capsule TAKE ONE CAPSULE BY MOUTH DAILY.  Marland Kitchen Cholecalciferol (VITAMIN D-3) 125 MCG (5000 UT) TABS Take 2 tablets by mouth daily.  . diphenhydrAMINE (BENADRYL) 25 mg capsule Take 25 mg by mouth every 6 (six) hours as needed.  Marland Kitchen escitalopram (LEXAPRO) 5 MG tablet Take 1 tablet (5 mg total) by mouth daily.  . meloxicam (MOBIC) 7.5 MG tablet Take 1 tablet (7.5 mg total) by mouth daily.  . Omega-3 Fatty Acids (FISH OIL) 1000 MG CAPS Take 1,000 mg by mouth daily.  Marland Kitchen omeprazole (PRILOSEC) 20 MG capsule Take 20 mg by mouth daily.  Marland Kitchen tiZANidine (ZANAFLEX) 2 MG tablet TAKE (1) TABLET BY MOUTH EVERY  EIGHT HOURS AS NEEDED.  Marland Kitchen vitamin E 1000 UNIT capsule Take 1,000 Units by mouth daily.  . [DISCONTINUED] atenolol (TENORMIN) 50 MG tablet TAKE (1) TABLET BY MOUTH ONCE DAILY.  . [DISCONTINUED] esomeprazole (NEXIUM) 40 MG capsule TAKE 1 CAPSULE BY MOUTH DAILY FOR ACID REFLUX.  . [DISCONTINUED] tamsulosin (FLOMAX) 0.4 MG CAPS capsule Take 1 capsule (0.4 mg total) by mouth 2 (two) times daily.  . [DISCONTINUED] triamcinolone (KENALOG) 0.1 % APPLY TO [place] Office Visit from 05/24/2020 in [facility]  PHQ-9 Total Score 0      Objective:   Today's Vitals: BP (!) 150/86   Pulse (!) 53   Temp 97.9 F (36.6 C) (Temporal)   Ht 4\' 11"  (1.499 m)   Wt  136 lb 12.8 oz (62.1 kg)   SpO2 98%   BMI 27.63 kg/m  Vitals with BMI 06/21/2020 05/24/2020 02/03/2020  Height 4\' 11"  4\' 11"  4\' 11"   Weight 136 lbs 13 oz 135 lbs 131 lbs  BMI 27.62 63.78 58.85  Systolic 027 741 287  Diastolic 86 82 91  Pulse 53 104 94     Physical Exam  She is alert and orientated and there are absolutely no focal neurological signs.  She has tenderness in the right temporal area consistent with the injury but there is no major swelling or bleeding in this area.     Assessment   1. Injury of head, initial encounter   2. Urgency incontinence       Tests ordered No orders of the defined types were placed in this encounter.    Plan: 1. I have reassured her that I do not find any major abnormalities on neurological exam and she should recover.  She can use Tylenol and meloxicam as needed to help the pain.  I have told her that if she has new symptoms such as confusion, limb weakness, speech difficulties, she must go to the emergency room. 2. Follow-up as scheduled   Meds ordered this encounter  Medications  . meloxicam (MOBIC) 7.5 MG tablet    Sig: Take 1 tablet (7.5 mg total) by mouth daily.    Dispense:  30 tablet    Refill:  3  . esomeprazole (NEXIUM) 40 MG capsule    Sig: TAKE 1 CAPSULE BY MOUTH DAILY FOR ACID REFLUX.    Dispense:  30 capsule    Refill:  2  . tamsulosin (FLOMAX) 0.4 MG CAPS capsule    Sig: Take 1 capsule (0.4 mg total) by mouth 2 (two) times daily.    Dispense:  60 capsule    Refill:  6  . triamcinolone cream (KENALOG) 0.1 %    Sig: APPLY TO AFFECTED AREA TWICE DAILY    Dispense:  60 g    Refill:  0    Anneli Bing Luther Parody, MD

## 2020-06-24 ENCOUNTER — Other Ambulatory Visit (INDEPENDENT_AMBULATORY_CARE_PROVIDER_SITE_OTHER): Payer: Self-pay | Admitting: Internal Medicine

## 2020-06-24 ENCOUNTER — Other Ambulatory Visit: Payer: Self-pay

## 2020-06-24 ENCOUNTER — Telehealth (INDEPENDENT_AMBULATORY_CARE_PROVIDER_SITE_OTHER): Payer: Self-pay

## 2020-06-24 ENCOUNTER — Ambulatory Visit (HOSPITAL_COMMUNITY)
Admission: RE | Admit: 2020-06-24 | Discharge: 2020-06-24 | Disposition: A | Discharge status: Home / Self Care | Payer: Medicare Other | Source: Ambulatory Visit | Attending: Internal Medicine | Admitting: Internal Medicine

## 2020-06-24 DIAGNOSIS — M545 Low back pain, unspecified: Secondary | ICD-10-CM | POA: Diagnosis not present

## 2020-06-24 DIAGNOSIS — S0990XA Unspecified injury of head, initial encounter: Secondary | ICD-10-CM

## 2020-06-24 DIAGNOSIS — R42 Dizziness and giddiness: Secondary | ICD-10-CM

## 2020-06-24 NOTE — Telephone Encounter (Signed)
I am going to forward this to Dr. Anastasio Champion as he saw the patient for the fall earlier this week.

## 2020-06-24 NOTE — Telephone Encounter (Signed)
Can you set up a stat CT brain scan of this patient, please.  I put the order in.

## 2020-06-24 NOTE — Telephone Encounter (Signed)
Patients son called and stated that he feels like his mom needs to have her head checked in a CT scan or MRI because she is worried that when his mom fell she is still having some dizziness and blurry vision that comes and goes and she is getting an MRI today and wanted to see if there was any way to check her head to make sure everything is okay?

## 2020-06-28 ENCOUNTER — Other Ambulatory Visit: Payer: Self-pay

## 2020-06-28 ENCOUNTER — Ambulatory Visit (HOSPITAL_COMMUNITY)
Admission: RE | Admit: 2020-06-28 | Discharge: 2020-06-28 | Disposition: A | Discharge status: Home / Self Care | Payer: Medicare Other | Source: Ambulatory Visit | Attending: Internal Medicine | Admitting: Internal Medicine

## 2020-06-28 ENCOUNTER — Other Ambulatory Visit (INDEPENDENT_AMBULATORY_CARE_PROVIDER_SITE_OTHER): Payer: Self-pay | Admitting: Internal Medicine

## 2020-06-28 ENCOUNTER — Other Ambulatory Visit (INDEPENDENT_AMBULATORY_CARE_PROVIDER_SITE_OTHER): Payer: Self-pay | Admitting: Nurse Practitioner

## 2020-06-28 DIAGNOSIS — R519 Headache, unspecified: Secondary | ICD-10-CM | POA: Diagnosis not present

## 2020-06-28 DIAGNOSIS — M545 Low back pain, unspecified: Secondary | ICD-10-CM

## 2020-06-28 DIAGNOSIS — R42 Dizziness and giddiness: Secondary | ICD-10-CM | POA: Diagnosis not present

## 2020-06-28 DIAGNOSIS — S0990XA Unspecified injury of head, initial encounter: Secondary | ICD-10-CM | POA: Diagnosis not present

## 2020-06-28 DIAGNOSIS — I10 Essential (primary) hypertension: Secondary | ICD-10-CM

## 2020-06-28 DIAGNOSIS — N189 Chronic kidney disease, unspecified: Secondary | ICD-10-CM | POA: Diagnosis not present

## 2020-06-28 DIAGNOSIS — I129 Hypertensive chronic kidney disease with stage 1 through stage 4 chronic kidney disease, or unspecified chronic kidney disease: Secondary | ICD-10-CM | POA: Diagnosis not present

## 2020-06-28 DIAGNOSIS — F32A Depression, unspecified: Secondary | ICD-10-CM | POA: Diagnosis not present

## 2020-06-28 DIAGNOSIS — N1832 Chronic kidney disease, stage 3b: Secondary | ICD-10-CM | POA: Diagnosis not present

## 2020-06-28 DIAGNOSIS — N2581 Secondary hyperparathyroidism of renal origin: Secondary | ICD-10-CM | POA: Diagnosis not present

## 2020-06-28 NOTE — Telephone Encounter (Signed)
Son ,[name] [name] is gong to pick her up and take now.

## 2020-06-28 NOTE — Progress Notes (Signed)
Please let the patient and her son know that the CT scan was completely normal, good news.

## 2020-06-28 NOTE — Telephone Encounter (Signed)
Thanks

## 2020-06-28 NOTE — Progress Notes (Signed)
Appt set & order she is going to [facility]. Son, Johnnette Litter is going back to home and pick her up and drop off for the testing.

## 2020-06-28 NOTE — Progress Notes (Signed)
Please let the patient know that the MRI lumbar spine is somewhat abnormal.  Tell her we will refer to neurosurgery for further evaluation.  I will put the order in.

## 2020-06-28 NOTE — Telephone Encounter (Signed)
Yes will work-in today Prior auth and schedule.

## 2020-06-29 ENCOUNTER — Telehealth (INDEPENDENT_AMBULATORY_CARE_PROVIDER_SITE_OTHER): Payer: Self-pay

## 2020-06-29 NOTE — Telephone Encounter (Signed)
Called this morning to give results. Pt was given in hospital also. Pt was sleep , son Stokes took verbal message and patient is aware.

## 2020-06-29 NOTE — Telephone Encounter (Signed)
-----   Message from Doree Albee, MD sent at 06/28/2020  5:34 PM EDT ----- Please let the patient and her son know that the CT scan was completely normal, good news.

## 2020-07-02 ENCOUNTER — Other Ambulatory Visit: Payer: Self-pay | Admitting: Nephrology

## 2020-07-02 DIAGNOSIS — N1832 Chronic kidney disease, stage 3b: Secondary | ICD-10-CM

## 2020-07-03 ENCOUNTER — Other Ambulatory Visit (INDEPENDENT_AMBULATORY_CARE_PROVIDER_SITE_OTHER): Payer: Self-pay | Admitting: Nurse Practitioner

## 2020-07-05 ENCOUNTER — Other Ambulatory Visit (INDEPENDENT_AMBULATORY_CARE_PROVIDER_SITE_OTHER): Payer: Self-pay

## 2020-07-06 MED ORDER — ESCITALOPRAM OXALATE 5 MG PO TABS: 5.0000 mg | ORAL_TABLET | Freq: Every day | ORAL | 0 refills | Status: DC

## 2020-07-06 MED ORDER — CELECOXIB 200 MG PO CAPS: 200.0000 mg | ORAL_CAPSULE | Freq: Every day | ORAL | 0 refills | Status: DC

## 2020-07-15 ENCOUNTER — Encounter (INDEPENDENT_AMBULATORY_CARE_PROVIDER_SITE_OTHER): Payer: Medicare Other | Admitting: Nurse Practitioner

## 2020-07-15 ENCOUNTER — Ambulatory Visit (INDEPENDENT_AMBULATORY_CARE_PROVIDER_SITE_OTHER): Payer: Medicare Other | Admitting: Nurse Practitioner

## 2020-07-21 ENCOUNTER — Ambulatory Visit
Admission: RE | Admit: 2020-07-21 | Discharge: 2020-07-21 | Disposition: A | Discharge status: Home / Self Care | Payer: Medicare Other | Source: Ambulatory Visit | Attending: Nephrology | Admitting: Nephrology

## 2020-07-21 DIAGNOSIS — N1832 Chronic kidney disease, stage 3b: Secondary | ICD-10-CM

## 2020-07-21 DIAGNOSIS — N261 Atrophy of kidney (terminal): Secondary | ICD-10-CM | POA: Diagnosis not present

## 2020-07-28 ENCOUNTER — Other Ambulatory Visit (INDEPENDENT_AMBULATORY_CARE_PROVIDER_SITE_OTHER): Payer: Self-pay | Admitting: Internal Medicine

## 2020-08-11 ENCOUNTER — Ambulatory Visit (INDEPENDENT_AMBULATORY_CARE_PROVIDER_SITE_OTHER): Payer: Medicare Other | Admitting: Nurse Practitioner

## 2020-08-11 ENCOUNTER — Encounter (INDEPENDENT_AMBULATORY_CARE_PROVIDER_SITE_OTHER): Payer: Self-pay | Admitting: Nurse Practitioner

## 2020-08-11 ENCOUNTER — Other Ambulatory Visit: Payer: Self-pay

## 2020-08-11 VITALS — Ht 59.0 in

## 2020-08-11 DIAGNOSIS — I10 Essential (primary) hypertension: Secondary | ICD-10-CM

## 2020-08-11 DIAGNOSIS — K219 Gastro-esophageal reflux disease without esophagitis: Secondary | ICD-10-CM

## 2020-08-11 DIAGNOSIS — N3941 Urge incontinence: Secondary | ICD-10-CM

## 2020-08-11 DIAGNOSIS — Z0001 Encounter for general adult medical examination with abnormal findings: Secondary | ICD-10-CM | POA: Diagnosis not present

## 2020-08-11 DIAGNOSIS — Z0000 Encounter for general adult medical examination without abnormal findings: Secondary | ICD-10-CM

## 2020-08-11 MED ORDER — TAMSULOSIN HCL 0.4 MG PO CAPS: 0.4000 mg | ORAL_CAPSULE | Freq: Two times a day (BID) | ORAL | 0 refills | Status: DC

## 2020-08-11 MED ORDER — AMLODIPINE BESYLATE 2.5 MG PO TABS
2.5000 mg | ORAL_TABLET | Freq: Every day | ORAL | 0 refills | Status: DC
Start: 2020-08-11 — End: 2020-10-13

## 2020-08-11 MED ORDER — OMEPRAZOLE 20 MG PO CPDR: 20.0000 mg | DELAYED_RELEASE_CAPSULE | Freq: Every day | ORAL | 0 refills | Status: DC

## 2020-08-11 NOTE — Patient Instructions (Addendum)
  [name] [name] , Thank you for taking time to come for your Medicare Wellness Visit. I appreciate your ongoing commitment to your health goals. Please review the following plan we discussed and let me know if I can assist you in the future.   These are the goals we discussed:  Goals   None     This is a list of the screening recommended for you and due dates:  Health Maintenance  Topic Date Due   Zoster (Shingles) Vaccine (1 of 2) Never done   COVID-19 Vaccine (1) 05/24/2021*   Flu Shot  09/20/2020   Tetanus Vaccine  08/22/2023   DEXA scan (bone density measurement)  Completed   Pneumonia vaccines  Completed   HPV Vaccine  Aged Out  *Topic was postponed. The date shown is not the original due date.

## 2020-08-11 NOTE — Progress Notes (Signed)
Due to national recommendations of social distancing related to the [place] pandemic, an audio-only tele-health visit was felt to be the most appropriate encounter type for this patient today. I connected with  [name] on 08/11/20 utilizing audio-only technology and verified that I am speaking with the correct person using two identifiers. The patient was located at their home, and I was located at the office of [facility] during the encounter. I discussed the limitations of evaluation and management by telemedicine. The patient expressed understanding and agreed to proceed.     Subjective:   [name] [name] is a 80 y.o. female who presents for Medicare Annual (Subsequent) preventive examination.  Review of Systems     Cardiac Risk Factors include: advanced age (>40men, >56 women)     Objective:    Today's Vitals   08/11/20 1603 08/11/20 1614  Height: 4\' 11"  (1.499 m)   PainSc:  7    Body mass index is 27.63 kg/m.  Advanced Directives 06/17/2019 08/09/2018 08/01/2018 07/31/2018 07/25/2018 07/18/2018 07/17/2018  Does Patient Have a Medical Advance Directive? No No No No No No No  Would patient like information on creating a medical advance directive? Yes (ED - Information included in AVS) No - Patient declined No - Patient declined No - Patient declined No - Patient declined No - Patient declined No - Patient declined  Pre-existing out of facility DNR order (yellow form or pink MOST form) - - - - - - -    Current Medications (verified) Outpatient Encounter Medications as of 08/11/2020  Medication Sig   acetaminophen (TYLENOL) 650 MG CR tablet Take 650 mg by mouth every 8 (eight) hours as needed for pain.   alendronate (FOSAMAX) 70 MG tablet TAKE 1 TABLET BY MOUTH ONCE WEEKLY. TAKE WITH GLASS OF WATER ON EMPTY STOMACH. GIVE ON SUNDAY.   amLODipine (NORVASC) 2.5 MG tablet TAKE 1 TABLET BY MOUTH ONCE DAILY.   Ascorbic Acid (VITAMIN C) 100 MG tablet Take 500 mg by mouth  daily.   celecoxib (CELEBREX) 200 MG capsule Take 1 capsule (200 mg total) by mouth daily.   Cholecalciferol (VITAMIN D-3) 125 MCG (5000 UT) TABS Take 2 tablets by mouth daily.   diphenhydrAMINE (BENADRYL) 25 mg capsule Take 25 mg by mouth every 6 (six) hours as needed.   escitalopram (LEXAPRO) 5 MG tablet Take 1 tablet (5 mg total) by mouth daily.   esomeprazole (NEXIUM) 40 MG capsule TAKE 1 CAPSULE BY MOUTH DAILY FOR ACID REFLUX.   meloxicam (MOBIC) 7.5 MG tablet Take 1 tablet (7.5 mg total) by mouth daily.   Omega-3 Fatty Acids (FISH OIL) 1000 MG CAPS Take 1,000 mg by mouth daily.   omeprazole (PRILOSEC) 20 MG capsule Take 20 mg by mouth daily.   tamsulosin (FLOMAX) 0.4 MG CAPS capsule Take 1 capsule (0.4 mg total) by mouth 2 (two) times daily.   tiZANidine (ZANAFLEX) 2 MG tablet TAKE (1) TABLET BY MOUTH EVERY EIGHT HOURS AS NEEDED.   triamcinolone cream (KENALOG) 0.1 % APPLY TO AFFECTED AREA TWICE DAILY   vitamin E 1000 UNIT capsule Take 1,000 Units by mouth daily.   atenolol (TENORMIN) 50 MG tablet Take 50 mg by mouth daily. (Patient not taking: Reported on 08/11/2020)   No facility-administered encounter medications on file as of 08/11/2020.    Allergies (verified) Morphine and related and Other   History: Past Medical History:  Diagnosis Date   Anxiety    Depression    GERD (gastroesophageal reflux  disease)    Hypercholesteremia    Hypertension    Past Surgical History:  Procedure Laterality Date   BREAST REDUCTION SURGERY     CESAREAN SECTION     COLONOSCOPY  08/2005   ESOPHAGOGASTRODUODENOSCOPY N/A 08/19/2013   Procedure: ESOPHAGOGASTRODUODENOSCOPY (EGD);  Surgeon: Rogene Houston, MD;  [facility];  Service: Endoscopy;  Laterality: N/A;  11:15 - rescheduled to 7:30 - Ann notified pt   excision of submandibular gland     HEMORRHOID SURGERY     INTRAMEDULLARY (IM) NAIL INTERTROCHANTERIC Left 07/14/2018   Procedure: INTRAMEDULLARY (IM) NAIL, LEFT HIP;  Surgeon:  Renette Butters, MD;  Location: [place];  Service: Orthopedics;  Laterality: Left;   STOMACH SURGERY     years ago. ? reason   TONSILLECTOMY     Family History  Problem Relation Age of Onset   Heart disease Mother    Sleep apnea Son    Diabetes Son    Social History   Socioeconomic History   Marital status: Widowed    Spouse name: Not on file   Number of children: Not on file   Years of education: Not on file   Highest education level: Not on file  Occupational History   Not on file  Tobacco Use   Smoking status: Never   Smokeless tobacco: Never  Vaping Use   Vaping Use: Never used  Substance and Sexual Activity   Alcohol use: No    Alcohol/week: 0.0 standard drinks   Drug use: No   Sexual activity: Not Currently    Birth control/protection: Post-menopausal  Other Topics Concern   Not on file  Social History Narrative   [name] with son.Retired.   Social Determinants of Health   Financial Resource Strain: Not on file  Food Insecurity: Not on file  Transportation Needs: Not on file  Physical Activity: Not on file  Stress: Not on file  Social Connections: Not on file    Tobacco Counseling Counseling given: Yes   Clinical Intake:  Pre-visit preparation completed: Yes  Pain : 0-10 Pain Score: 7  Pain Type: Chronic pain Pain Location: Back Pain Descriptors / Indicators: Aching Pain Frequency: Constant     BMI - recorded:  (unable to determine today) Nutritional Risks: None Diabetes: No  How often do you need to have someone help you when you read instructions, pamphlets, or other written materials from your doctor or pharmacy?: 3 - Sometimes What is the last grade level you completed in school?: 6th grade; completed GED  Diabetic? No  Interpreter Needed?: No  Information entered by :: Jeralyn Ruths, NP-C   Activities of Daily Living In your present state of health, do you have any difficulty performing the following activities: 08/11/2020   Hearing? N  Vision? N  Difficulty concentrating or making decisions? N  Walking or climbing stairs? Y  Dressing or bathing? N  Doing errands, shopping? Y  Preparing Food and eating ? N  Using the Toilet? N  In the past six months, have you accidently leaked urine? N  Do you have problems with loss of bowel control? N  Managing your Medications? N  Managing your Finances? N  Housekeeping or managing your Housekeeping? Y  Some recent data might be hidden    Patient Care Team: Ailene Ards, NP as PCP - General (Nurse Practitioner) Satira Sark, MD as PCP - Cardiology (Cardiology)  Indicate any recent Medical Services you may have received from other than [facility] providers  in the past year (date may be approximate).     Assessment:   This is a routine wellness examination for Janayah.  Hearing/Vision screen No results found.  Dietary issues and exercise activities discussed: Current Exercise Habits: Home exercise routine, Type of exercise: walking, Exercise limited by: orthopedic condition(s)   Goals Addressed   None    Depression Screen PHQ 2/9 Scores 08/11/2020 05/24/2020 09/11/2019 12/03/2017 12/03/2017  PHQ - 2 Score 1 0 1 2 2   PHQ- 9 Score 3 0 - 7 -    Fall Risk Fall Risk  08/11/2020 05/24/2020 09/11/2019 07/03/2019  Falls in the past year? 1 0 1 1  Number falls in past yr: 1 - 1 0  Injury with Fall? 1 - 0 1    FALL RISK PREVENTION PERTAINING TO THE HOME:  Any stairs in or around the home? No  If so, are there any without handrails?  N/A Home free of loose throw rugs in walkways, pet beds, electrical cords, etc? Yes  Adequate lighting in your home to reduce risk of falls? Yes   ASSISTIVE DEVICES UTILIZED TO PREVENT FALLS:  Life alert? No  Use of a cane, walker or w/c? Yes  Grab bars in the bathroom? Yes  Shower chair or bench in shower? Yes  Elevated toilet seat or a handicapped toilet? Yes   TIMED UP AND GO:  Was the test performed? No .  Length of time to  ambulate 10 feet: N/A sec.     Cognitive Function:     6CIT Screen 08/11/2020  What Year? 0 points  What month? 0 points  What time? 0 points  Count back from 20 0 points  Months in reverse 0 points  Repeat phrase 10 points  Total Score 10    Immunizations Immunization History  Administered Date(s) Administered   Influenza, High Dose Seasonal PF 11/11/2016   Influenza-Unspecified 11/26/2017   Pneumococcal Conjugate-13 08/21/2013   Pneumococcal Polysaccharide-23 12/14/2010   Tdap 08/21/2013    TDAP status: Up to date  Flu Vaccine status: Up to date  Pneumococcal vaccine status: Up to date  Covid-19 vaccine status: Declined, Education has been provided regarding the importance of this vaccine but patient still declined. Advised may receive this vaccine at local pharmacy or Health Dept.or vaccine clinic. Aware to provide a copy of the vaccination record if obtained from local pharmacy or Health Dept. Verbalized acceptance and understanding.  Qualifies for Shingles Vaccine? Yes   Zostavax completed No   Shingrix Completed?: No.    Education has been provided regarding the importance of this vaccine. Patient has been advised to call insurance company to determine out of pocket expense if they have not yet received this vaccine. Advised may also receive vaccine at local pharmacy or Health Dept. Verbalized acceptance and understanding.  Screening Tests Health Maintenance  Topic Date Due   Zoster Vaccines- Shingrix (1 of 2) Never done   COVID-19 Vaccine (1) 05/24/2021 (Originally 04/01/1945)   INFLUENZA VACCINE  09/20/2020   TETANUS/TDAP  08/22/2023   DEXA SCAN  Completed   PNA vac Low Risk Adult  Completed   HPV VACCINES  Aged Out    Health Maintenance  Health Maintenance Due  Topic Date Due   Zoster Vaccines- Shingrix (1 of 2) Never done    Colorectal cancer screening: No longer required.   Mammogram status: No longer required due to age.  Bone Density status:  Completed 2017. Results reflect: Bone density results: OSTEOPOROSIS. Repeat every 2 years. -  patient prefers to not undergo test at this time  Lung Cancer Screening: (Low Dose CT Chest recommended if Age 58-80 years, 30 pack-year currently smoking OR have quit w/in 15years.) does not qualify.   Lung Cancer Screening Referral: N/A  Additional Screening:  Hepatitis C Screening: does not qualify; Completed N/A  Vision Screening: Recommended annual ophthalmology exams for early detection of glaucoma and other disorders of the eye. Is the patient up to date with their annual eye exam?  Yes  Who is the provider or what is the name of the office in which the patient attends annual eye exams? [facility] If pt is not established with a provider, would they like to be referred to a provider to establish care? No .   Dental Screening: Recommended annual dental exams for proper oral hygiene  Community Resource Referral / Chronic Care Management: CRR required this visit?  No   CCM required this visit?  No      Plan:     I have personally reviewed and noted the following in the patient's chart:   Medical and social history Use of alcohol, tobacco or illicit drugs  Current medications and supplements including opioid prescriptions.  Functional ability and status Nutritional status Physical activity Advanced directives List of other physicians Hospitalizations, surgeries, and ER visits in previous 12 months Vitals Screenings to include cognitive, depression, and falls Referrals and appointments  In addition, I have reviewed and discussed with patient certain preventive protocols, quality metrics, and best practice recommendations. A written personalized care plan for preventive services as well as general preventive health recommendations were provided to patient.    Patient will follow-up in 2 months or sooner as needed.   Ailene Ards, NP   08/11/2020

## 2020-08-18 ENCOUNTER — Other Ambulatory Visit (INDEPENDENT_AMBULATORY_CARE_PROVIDER_SITE_OTHER): Payer: Self-pay | Admitting: Internal Medicine

## 2020-08-18 DIAGNOSIS — N3941 Urge incontinence: Secondary | ICD-10-CM

## 2020-08-18 DIAGNOSIS — I10 Essential (primary) hypertension: Secondary | ICD-10-CM

## 2020-08-18 DIAGNOSIS — K219 Gastro-esophageal reflux disease without esophagitis: Secondary | ICD-10-CM

## 2020-08-18 DIAGNOSIS — M62838 Other muscle spasm: Secondary | ICD-10-CM

## 2020-08-18 MED ORDER — ESCITALOPRAM OXALATE 5 MG PO TABS: 5.0000 mg | ORAL_TABLET | Freq: Every day | ORAL | 0 refills | Status: DC

## 2020-09-20 DIAGNOSIS — F32A Depression, unspecified: Secondary | ICD-10-CM | POA: Diagnosis not present

## 2020-09-20 DIAGNOSIS — N1832 Chronic kidney disease, stage 3b: Secondary | ICD-10-CM | POA: Diagnosis not present

## 2020-09-20 DIAGNOSIS — N2581 Secondary hyperparathyroidism of renal origin: Secondary | ICD-10-CM | POA: Diagnosis not present

## 2020-09-20 DIAGNOSIS — I129 Hypertensive chronic kidney disease with stage 1 through stage 4 chronic kidney disease, or unspecified chronic kidney disease: Secondary | ICD-10-CM | POA: Diagnosis not present

## 2020-09-23 ENCOUNTER — Encounter (INDEPENDENT_AMBULATORY_CARE_PROVIDER_SITE_OTHER): Payer: Self-pay

## 2020-09-27 ENCOUNTER — Other Ambulatory Visit (INDEPENDENT_AMBULATORY_CARE_PROVIDER_SITE_OTHER): Payer: Self-pay | Admitting: Nurse Practitioner

## 2020-10-02 ENCOUNTER — Other Ambulatory Visit (INDEPENDENT_AMBULATORY_CARE_PROVIDER_SITE_OTHER): Payer: Self-pay | Admitting: Nurse Practitioner

## 2020-10-12 ENCOUNTER — Telehealth (INDEPENDENT_AMBULATORY_CARE_PROVIDER_SITE_OTHER): Payer: Self-pay

## 2020-10-12 DIAGNOSIS — M62838 Other muscle spasm: Secondary | ICD-10-CM

## 2020-10-12 DIAGNOSIS — N3941 Urge incontinence: Secondary | ICD-10-CM

## 2020-10-12 DIAGNOSIS — I10 Essential (primary) hypertension: Secondary | ICD-10-CM

## 2020-10-12 NOTE — Telephone Encounter (Signed)
Patient called and left a detailed voice message that she needs refills on everything and to please send them to [place]. It looks like the Lexapro and the Atenolol have recently been sent but everything else needs to be sent.  Patient especially requests these to be sent also:  tiZANidine (ZANAFLEX) 2 MG tablet  Last filled 08/18/2020  triamcinolone cream (KENALOG) 0.1 %  Last filled 07/28/2020, 60 g with 0 refills  alendronate (FOSAMAX) 70 MG tablet  Last filled 05/12/2020, # 4 with 3 refills  Patient still needs other medications also. Please review. All to [employer]. Thank you!  Patient also stated that she has a cold and wants an antibiotic. I will let her know that she will need to be seen at the [facility] for this.

## 2020-10-13 ENCOUNTER — Encounter (INDEPENDENT_AMBULATORY_CARE_PROVIDER_SITE_OTHER): Payer: Self-pay | Admitting: Nurse Practitioner

## 2020-10-13 ENCOUNTER — Telehealth (INDEPENDENT_AMBULATORY_CARE_PROVIDER_SITE_OTHER): Payer: Medicare Other | Admitting: Nurse Practitioner

## 2020-10-13 ENCOUNTER — Other Ambulatory Visit: Payer: Self-pay

## 2020-10-13 VITALS — Ht 59.0 in

## 2020-10-13 DIAGNOSIS — M810 Age-related osteoporosis without current pathological fracture: Secondary | ICD-10-CM | POA: Diagnosis not present

## 2020-10-13 DIAGNOSIS — K219 Gastro-esophageal reflux disease without esophagitis: Secondary | ICD-10-CM

## 2020-10-13 DIAGNOSIS — U071 COVID-19: Secondary | ICD-10-CM | POA: Diagnosis not present

## 2020-10-13 MED ORDER — TIZANIDINE HCL 2 MG PO TABS: ORAL_TABLET | ORAL | 0 refills | Status: AC

## 2020-10-13 MED ORDER — DM-GUAIFENESIN ER 30-600 MG PO TB12: 1.0000 | ORAL_TABLET | Freq: Two times a day (BID) | ORAL | 0 refills | Status: AC

## 2020-10-13 MED ORDER — TRIAMCINOLONE ACETONIDE 0.1 % EX CREA: TOPICAL_CREAM | CUTANEOUS | 0 refills | Status: AC

## 2020-10-13 MED ORDER — ESOMEPRAZOLE MAGNESIUM 40 MG PO CPDR: DELAYED_RELEASE_CAPSULE | ORAL | 2 refills | Status: AC

## 2020-10-13 MED ORDER — AMLODIPINE BESYLATE 2.5 MG PO TABS: 2.5000 mg | ORAL_TABLET | Freq: Every day | ORAL | 0 refills | Status: AC

## 2020-10-13 MED ORDER — TAMSULOSIN HCL 0.4 MG PO CAPS: 0.4000 mg | ORAL_CAPSULE | Freq: Two times a day (BID) | ORAL | 0 refills | Status: AC

## 2020-10-13 NOTE — Telephone Encounter (Signed)
I have reviewed her medications.  And unfortunately because her creatinine clearance is less than 30 based on her last lab work.  She really should not be on alendronate any longer.  She will need to discuss osteoporosis treatment with her next primary care provider.  Also, she has both [place] omeprazole and omeprazole on her medication list.  I need to know which medication she is taking, so that I can prescribe the correct 1.  Please verify with her what she is been on.  If she has been on both I really think she needs to be on 1 or the other so if this is the case I would recommend refill of the omeprazole which I can do.  I have approved and refilled prescription for tizanidine, triamcinolone, amlodipine, and tamsulosin.

## 2020-10-13 NOTE — Progress Notes (Signed)
Due to national recommendations of social distancing related to the [place] pandemic, an audio-only tele-health visit was felt to be the most appropriate encounter type for this patient today. I connected with  [name] [name] on 10/13/20 utilizing audio-only technology and verified that I am speaking with the correct person using two identifiers. The patient was located at their home, and I was located at the office of [facility] during the encounter. I discussed the limitations of evaluation and management by telemedicine. The patient expressed understanding and agreed to proceed.    Subjective:  Patient ID: [name] [name], female    DOB: Feb 26, 1940  Age: 80 y.o. MRN: [id number]  CC:  Chief Complaint  Patient presents with   Covid Exposure    Patients symptoms started last Tuesday, cough, chest discomfort and chest congestion and coughing up phlegm, weakness, fatigue, headache and wants something for chest congestion      HPI  This patient arrives today for a virtual visit for the above.  She recently tested positive for COVID and tells me her symptoms started about 8 days ago.  She tells me overall she is feeling better but she continues to feel some fatigue and weakness.  She is also experiencing some cough and chest congestion.  She denies any nausea, vomiting, fever, cardiac palpitations, shortness of breath.  She tells me she is able to walk around her home.  She has not been vaccinated against COVID-19.  She also requested refill on all her chronic medications earlier today.  Per chart review I saw that her creatinine clearance was a bit low so she should no longer be on alendronate due to this.  She also has omeprazole and esomeprazole on her medication list.  She tells me that the [name] seems to help her better and she would like this filled if possible.  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   Social History   Tobacco Use   Smoking status: Never   Smokeless tobacco: Never  Substance Use Topics   Alcohol use: No    Alcohol/week: 0.0 standard drinks     Current Meds  Medication Sig   acetaminophen (TYLENOL) 650 MG CR tablet Take 650 mg by mouth every 8 (eight) hours as needed for pain.   amLODipine (NORVASC) 2.5 MG tablet Take 1 tablet (2.5 mg total) by mouth daily.   Ascorbic Acid (VITAMIN C) 100 MG tablet Take 500 mg by mouth daily.   atenolol (TENORMIN) 50 MG tablet TAKE (1) TABLET BY MOUTH ONCE DAILY.   Cholecalciferol (VITAMIN D-3) 125 MCG (5000 UT) TABS Take 2 tablets by mouth daily.   dextromethorphan-guaiFENesin (MUCINEX DM) 30-600 MG 12hr tablet Take 1 tablet by mouth 2 (two) times daily.   diphenhydrAMINE (BENADRYL) 25 mg capsule Take 25 mg by mouth every 6 (six) hours as needed.   escitalopram (LEXAPRO) 5 MG tablet TAKE 1 TABLET BY MOUTH ONCE DAILY.   meloxicam (MOBIC) 7.5 MG tablet Take 1 tablet (7.5 mg total) by mouth daily.   Omega-3 Fatty Acids (FISH OIL) 1000 MG CAPS Take 1,000  mg by mouth daily.   tamsulosin (FLOMAX) 0.4 MG CAPS capsule Take 1 capsule (0.4 mg total) by mouth 2 (two) times daily.   tiZANidine (ZANAFLEX) 2 MG tablet TAKE (1) TABLET BY MOUTH EVERY EIGHT HOURS AS NEEDED.   triamcinolone cream (KENALOG) 0.1 % Apply to affected area twice a day as needed for up to 14 days   vitamin E 1000 UNIT capsule Take 1,000 Units by mouth daily.   [DISCONTINUED] alendronate (FOSAMAX) 70 MG tablet TAKE 1 TABLET BY MOUTH ONCE WEEKLY. TAKE WITH GLASS OF WATER ON EMPTY STOMACH. GIVE ON SUNDAY.   [DISCONTINUED] esomeprazole (NEXIUM) 40 MG capsule TAKE 1 CAPSULE BY MOUTH DAILY FOR ACID REFLUX.   [DISCONTINUED]  omeprazole (PRILOSEC) 20 MG capsule Take 1 capsule (20 mg total) by mouth daily.    ROS:  See HPI   Objective:   Today's Vitals: Ht '4\' 11"'$  (1.499 m)   BMI 27.63 kg/m  Vitals with BMI 10/13/2020 08/11/2020 06/21/2020  Height '4\' 11"'$  '4\' 11"'$  '4\' 11"'$   Weight - (No Data) 136 lbs 13 oz  BMI - - 123456  Systolic - (No Data) [id number]  Diastolic - (No Data) 86  Pulse - (No Data) 53     Physical Exam Comprehensive physical exam not completed today as office visit was conducted remotely.  Patient sounded well over the phone.  She was able to speak in complete sentences, no cough noted, no shortness of breath noted either.  Patient was alert and oriented, and appeared to have appropriate judgment.       Assessment and Plan   1. COVID-19   2. Gastroesophageal reflux disease without esophagitis   3. Age-related osteoporosis without current pathological fracture      Plan: 1.  We will prescribe Mucinex DM that she can take twice a day as needed for cough and congestion.  I did reach out to my supervising physician to discuss this plan he is agreeable with this.  She was told if her symptoms worsen especially if she experiences any shortness of breath, chest pain, worsening cough, worsening fatigue or weakness then she needs to go to the emergency department.  She expressed her understanding.  She is outside of the window for antiviral therapy. 2.  We will refill his omeprazole. 3.  I recommended that she stop her alendronate and follow-up with a new primary care provider to discuss other treatment options when she establishes somewhere.   Tests ordered No orders of the defined types were placed in this encounter.     Meds ordered this encounter  Medications   dextromethorphan-guaiFENesin (MUCINEX DM) 30-600 MG 12hr tablet    Sig: Take 1 tablet by mouth 2 (two) times daily.    Dispense:  20 tablet    Refill:  0    Order Specific Question:   Supervising Provider    Answer:   Lindell Spar [id number]   esomeprazole (NEXIUM) 40 MG capsule    Sig: TAKE 1 CAPSULE BY MOUTH DAILY FOR ACID REFLUX.    Dispense:  30 capsule    Refill:  2    Order Specific Question:   Supervising Provider    Answer:   Lindell Spar [id number]    Patient not scheduled for follow-up as this office is closing permanently as of 10/20/20 due to the passing of Dr. Anastasio Champion.  The patient was notified of this and that they will need to find a new primary care provider.  They express understanding.  Total  time spent on telephone with this patient was approximately 10 minutes.   Ailene Ards, NP

## 2020-10-13 NOTE — Telephone Encounter (Signed)
I just received a VM from patients son and he stated that he had Covid and now [name] [name] is sick and her symptoms started last Tuesday and she is getting better but is having chest congestion and wanting to know if you can send something in to help her with this?  I have not called her for the other medication yet so can you go over in visit?  I have scheduled a telephone visit at 4:20pm and told son that it may be earlier.

## 2020-10-21 ENCOUNTER — Ambulatory Visit (INDEPENDENT_AMBULATORY_CARE_PROVIDER_SITE_OTHER): Payer: Medicare Other | Admitting: Nurse Practitioner

## 2020-12-30 DIAGNOSIS — M545 Low back pain, unspecified: Secondary | ICD-10-CM | POA: Diagnosis not present

## 2020-12-30 DIAGNOSIS — M5416 Radiculopathy, lumbar region: Secondary | ICD-10-CM | POA: Diagnosis not present

## 2020-12-30 DIAGNOSIS — M5136 Other intervertebral disc degeneration, lumbar region: Secondary | ICD-10-CM | POA: Diagnosis not present

## 2020-12-30 DIAGNOSIS — Z79899 Other long term (current) drug therapy: Secondary | ICD-10-CM | POA: Diagnosis not present

## 2020-12-30 DIAGNOSIS — G8929 Other chronic pain: Secondary | ICD-10-CM | POA: Diagnosis not present

## 2020-12-30 DIAGNOSIS — N183 Chronic kidney disease, stage 3 unspecified: Secondary | ICD-10-CM | POA: Diagnosis not present

## 2021-01-20 ENCOUNTER — Other Ambulatory Visit (INDEPENDENT_AMBULATORY_CARE_PROVIDER_SITE_OTHER): Payer: Self-pay | Admitting: Nurse Practitioner

## 2021-01-20 DIAGNOSIS — M62838 Other muscle spasm: Secondary | ICD-10-CM

## 2021-01-20 DIAGNOSIS — K219 Gastro-esophageal reflux disease without esophagitis: Secondary | ICD-10-CM

## 2021-02-05 DIAGNOSIS — M5136 Other intervertebral disc degeneration, lumbar region: Secondary | ICD-10-CM | POA: Diagnosis not present

## 2021-02-05 DIAGNOSIS — N183 Chronic kidney disease, stage 3 unspecified: Secondary | ICD-10-CM | POA: Diagnosis not present

## 2021-02-05 DIAGNOSIS — F32A Depression, unspecified: Secondary | ICD-10-CM | POA: Diagnosis not present

## 2021-02-05 DIAGNOSIS — Z79899 Other long term (current) drug therapy: Secondary | ICD-10-CM | POA: Diagnosis not present

## 2021-02-05 DIAGNOSIS — G8929 Other chronic pain: Secondary | ICD-10-CM | POA: Diagnosis not present

## 2021-02-05 DIAGNOSIS — M5416 Radiculopathy, lumbar region: Secondary | ICD-10-CM | POA: Diagnosis not present

## 2021-03-11 DIAGNOSIS — F32A Depression, unspecified: Secondary | ICD-10-CM | POA: Diagnosis not present

## 2021-03-11 DIAGNOSIS — Z79899 Other long term (current) drug therapy: Secondary | ICD-10-CM | POA: Diagnosis not present

## 2021-03-11 DIAGNOSIS — M5136 Other intervertebral disc degeneration, lumbar region: Secondary | ICD-10-CM | POA: Diagnosis not present

## 2021-03-11 DIAGNOSIS — M5416 Radiculopathy, lumbar region: Secondary | ICD-10-CM | POA: Diagnosis not present

## 2021-03-11 DIAGNOSIS — R030 Elevated blood-pressure reading, without diagnosis of hypertension: Secondary | ICD-10-CM | POA: Diagnosis not present

## 2021-03-11 DIAGNOSIS — G8929 Other chronic pain: Secondary | ICD-10-CM | POA: Diagnosis not present

## 2021-04-20 ENCOUNTER — Other Ambulatory Visit (INDEPENDENT_AMBULATORY_CARE_PROVIDER_SITE_OTHER): Payer: Self-pay | Admitting: Nurse Practitioner

## 2021-05-22 DIAGNOSIS — G8929 Other chronic pain: Secondary | ICD-10-CM | POA: Diagnosis not present

## 2021-05-22 DIAGNOSIS — N1832 Chronic kidney disease, stage 3b: Secondary | ICD-10-CM | POA: Diagnosis not present

## 2021-05-22 DIAGNOSIS — Z79899 Other long term (current) drug therapy: Secondary | ICD-10-CM | POA: Diagnosis not present

## 2021-05-22 DIAGNOSIS — J309 Allergic rhinitis, unspecified: Secondary | ICD-10-CM | POA: Diagnosis not present

## 2021-05-22 DIAGNOSIS — R030 Elevated blood-pressure reading, without diagnosis of hypertension: Secondary | ICD-10-CM | POA: Diagnosis not present

## 2021-05-22 DIAGNOSIS — M5416 Radiculopathy, lumbar region: Secondary | ICD-10-CM | POA: Diagnosis not present

## 2021-05-22 DIAGNOSIS — Z0000 Encounter for general adult medical examination without abnormal findings: Secondary | ICD-10-CM | POA: Diagnosis not present

## 2021-05-22 DIAGNOSIS — M5136 Other intervertebral disc degeneration, lumbar region: Secondary | ICD-10-CM | POA: Diagnosis not present

## 2021-05-22 DIAGNOSIS — F32A Depression, unspecified: Secondary | ICD-10-CM | POA: Diagnosis not present

## 2021-06-27 DIAGNOSIS — F32A Depression, unspecified: Secondary | ICD-10-CM | POA: Diagnosis not present

## 2021-06-27 DIAGNOSIS — M5416 Radiculopathy, lumbar region: Secondary | ICD-10-CM | POA: Diagnosis not present

## 2021-06-27 DIAGNOSIS — Z79899 Other long term (current) drug therapy: Secondary | ICD-10-CM | POA: Diagnosis not present

## 2021-06-27 DIAGNOSIS — R030 Elevated blood-pressure reading, without diagnosis of hypertension: Secondary | ICD-10-CM | POA: Diagnosis not present

## 2021-06-27 DIAGNOSIS — M5136 Other intervertebral disc degeneration, lumbar region: Secondary | ICD-10-CM | POA: Diagnosis not present

## 2021-06-27 DIAGNOSIS — G8929 Other chronic pain: Secondary | ICD-10-CM | POA: Diagnosis not present

## 2021-07-28 DIAGNOSIS — R030 Elevated blood-pressure reading, without diagnosis of hypertension: Secondary | ICD-10-CM | POA: Diagnosis not present

## 2021-07-28 DIAGNOSIS — M5136 Other intervertebral disc degeneration, lumbar region: Secondary | ICD-10-CM | POA: Diagnosis not present

## 2021-07-28 DIAGNOSIS — M5416 Radiculopathy, lumbar region: Secondary | ICD-10-CM | POA: Diagnosis not present

## 2021-07-28 DIAGNOSIS — G8929 Other chronic pain: Secondary | ICD-10-CM | POA: Diagnosis not present

## 2021-07-28 DIAGNOSIS — M25552 Pain in left hip: Secondary | ICD-10-CM | POA: Diagnosis not present

## 2021-07-28 DIAGNOSIS — F32A Depression, unspecified: Secondary | ICD-10-CM | POA: Diagnosis not present

## 2021-07-28 DIAGNOSIS — Z79899 Other long term (current) drug therapy: Secondary | ICD-10-CM | POA: Diagnosis not present

## 2021-07-28 DIAGNOSIS — M810 Age-related osteoporosis without current pathological fracture: Secondary | ICD-10-CM | POA: Diagnosis not present

## 2021-08-08 DIAGNOSIS — R3915 Urgency of urination: Secondary | ICD-10-CM | POA: Diagnosis not present

## 2021-08-08 DIAGNOSIS — N3281 Overactive bladder: Secondary | ICD-10-CM | POA: Diagnosis not present

## 2021-09-01 DIAGNOSIS — Z780 Asymptomatic menopausal state: Secondary | ICD-10-CM | POA: Diagnosis not present

## 2021-09-01 DIAGNOSIS — F32A Depression, unspecified: Secondary | ICD-10-CM | POA: Diagnosis not present

## 2021-09-01 DIAGNOSIS — Z0130 Encounter for examination of blood pressure without abnormal findings: Secondary | ICD-10-CM | POA: Diagnosis not present

## 2021-09-01 DIAGNOSIS — M25552 Pain in left hip: Secondary | ICD-10-CM | POA: Diagnosis not present

## 2021-09-01 DIAGNOSIS — M5416 Radiculopathy, lumbar region: Secondary | ICD-10-CM | POA: Diagnosis not present

## 2021-09-01 DIAGNOSIS — I10 Essential (primary) hypertension: Secondary | ICD-10-CM | POA: Diagnosis not present

## 2021-09-01 DIAGNOSIS — R030 Elevated blood-pressure reading, without diagnosis of hypertension: Secondary | ICD-10-CM | POA: Diagnosis not present

## 2021-09-01 DIAGNOSIS — M549 Dorsalgia, unspecified: Secondary | ICD-10-CM | POA: Diagnosis not present

## 2021-09-01 DIAGNOSIS — Z79899 Other long term (current) drug therapy: Secondary | ICD-10-CM | POA: Diagnosis not present

## 2021-09-01 DIAGNOSIS — K219 Gastro-esophageal reflux disease without esophagitis: Secondary | ICD-10-CM | POA: Diagnosis not present

## 2021-09-26 DIAGNOSIS — R3915 Urgency of urination: Secondary | ICD-10-CM | POA: Diagnosis not present

## 2021-09-26 DIAGNOSIS — N3281 Overactive bladder: Secondary | ICD-10-CM | POA: Diagnosis not present

## 2021-10-03 DIAGNOSIS — M5136 Other intervertebral disc degeneration, lumbar region: Secondary | ICD-10-CM | POA: Diagnosis not present

## 2021-10-03 DIAGNOSIS — Z0130 Encounter for examination of blood pressure without abnormal findings: Secondary | ICD-10-CM | POA: Diagnosis not present

## 2021-10-03 DIAGNOSIS — N1832 Chronic kidney disease, stage 3b: Secondary | ICD-10-CM | POA: Diagnosis not present

## 2021-10-03 DIAGNOSIS — Z780 Asymptomatic menopausal state: Secondary | ICD-10-CM | POA: Diagnosis not present

## 2021-10-03 DIAGNOSIS — R030 Elevated blood-pressure reading, without diagnosis of hypertension: Secondary | ICD-10-CM | POA: Diagnosis not present

## 2021-10-03 DIAGNOSIS — M5416 Radiculopathy, lumbar region: Secondary | ICD-10-CM | POA: Diagnosis not present

## 2021-10-03 DIAGNOSIS — G8929 Other chronic pain: Secondary | ICD-10-CM | POA: Diagnosis not present

## 2021-10-03 DIAGNOSIS — Z79899 Other long term (current) drug therapy: Secondary | ICD-10-CM | POA: Diagnosis not present

## 2021-11-05 DIAGNOSIS — Z79899 Other long term (current) drug therapy: Secondary | ICD-10-CM | POA: Diagnosis not present

## 2021-11-05 DIAGNOSIS — N1832 Chronic kidney disease, stage 3b: Secondary | ICD-10-CM | POA: Diagnosis not present

## 2021-11-05 DIAGNOSIS — Z780 Asymptomatic menopausal state: Secondary | ICD-10-CM | POA: Diagnosis not present

## 2021-11-05 DIAGNOSIS — R030 Elevated blood-pressure reading, without diagnosis of hypertension: Secondary | ICD-10-CM | POA: Diagnosis not present

## 2021-11-05 DIAGNOSIS — G8929 Other chronic pain: Secondary | ICD-10-CM | POA: Diagnosis not present

## 2021-11-05 DIAGNOSIS — Z0130 Encounter for examination of blood pressure without abnormal findings: Secondary | ICD-10-CM | POA: Diagnosis not present

## 2021-11-05 DIAGNOSIS — I10 Essential (primary) hypertension: Secondary | ICD-10-CM | POA: Diagnosis not present

## 2021-11-05 DIAGNOSIS — M5416 Radiculopathy, lumbar region: Secondary | ICD-10-CM | POA: Diagnosis not present

## 2021-11-05 DIAGNOSIS — L219 Seborrheic dermatitis, unspecified: Secondary | ICD-10-CM | POA: Diagnosis not present

## 2021-11-05 DIAGNOSIS — M5136 Other intervertebral disc degeneration, lumbar region: Secondary | ICD-10-CM | POA: Diagnosis not present

## 2021-12-03 DIAGNOSIS — N1832 Chronic kidney disease, stage 3b: Secondary | ICD-10-CM | POA: Diagnosis not present

## 2021-12-03 DIAGNOSIS — G8929 Other chronic pain: Secondary | ICD-10-CM | POA: Diagnosis not present

## 2021-12-03 DIAGNOSIS — Z79899 Other long term (current) drug therapy: Secondary | ICD-10-CM | POA: Diagnosis not present

## 2021-12-03 DIAGNOSIS — M5136 Other intervertebral disc degeneration, lumbar region: Secondary | ICD-10-CM | POA: Diagnosis not present

## 2021-12-03 DIAGNOSIS — Z0130 Encounter for examination of blood pressure without abnormal findings: Secondary | ICD-10-CM | POA: Diagnosis not present

## 2021-12-03 DIAGNOSIS — M5416 Radiculopathy, lumbar region: Secondary | ICD-10-CM | POA: Diagnosis not present

## 2021-12-03 DIAGNOSIS — R030 Elevated blood-pressure reading, without diagnosis of hypertension: Secondary | ICD-10-CM | POA: Diagnosis not present

## 2022-01-03 DIAGNOSIS — M5416 Radiculopathy, lumbar region: Secondary | ICD-10-CM | POA: Diagnosis not present

## 2022-01-03 DIAGNOSIS — Z0130 Encounter for examination of blood pressure without abnormal findings: Secondary | ICD-10-CM | POA: Diagnosis not present

## 2022-01-03 DIAGNOSIS — G8929 Other chronic pain: Secondary | ICD-10-CM | POA: Diagnosis not present

## 2022-01-03 DIAGNOSIS — N1832 Chronic kidney disease, stage 3b: Secondary | ICD-10-CM | POA: Diagnosis not present

## 2022-01-03 DIAGNOSIS — Z79899 Other long term (current) drug therapy: Secondary | ICD-10-CM | POA: Diagnosis not present

## 2022-01-03 DIAGNOSIS — R030 Elevated blood-pressure reading, without diagnosis of hypertension: Secondary | ICD-10-CM | POA: Diagnosis not present

## 2022-01-03 DIAGNOSIS — M5136 Other intervertebral disc degeneration, lumbar region: Secondary | ICD-10-CM | POA: Diagnosis not present

## 2022-03-06 ENCOUNTER — Telehealth: Payer: Self-pay | Admitting: Cardiology

## 2022-03-06 DIAGNOSIS — R030 Elevated blood-pressure reading, without diagnosis of hypertension: Secondary | ICD-10-CM | POA: Diagnosis not present

## 2022-03-06 DIAGNOSIS — Z79899 Other long term (current) drug therapy: Secondary | ICD-10-CM | POA: Diagnosis not present

## 2022-03-06 DIAGNOSIS — N1832 Chronic kidney disease, stage 3b: Secondary | ICD-10-CM | POA: Diagnosis not present

## 2022-03-06 DIAGNOSIS — M5416 Radiculopathy, lumbar region: Secondary | ICD-10-CM | POA: Diagnosis not present

## 2022-03-06 DIAGNOSIS — I10 Essential (primary) hypertension: Secondary | ICD-10-CM | POA: Diagnosis not present

## 2022-03-06 DIAGNOSIS — M5136 Other intervertebral disc degeneration, lumbar region: Secondary | ICD-10-CM | POA: Diagnosis not present

## 2022-03-06 DIAGNOSIS — Z0130 Encounter for examination of blood pressure without abnormal findings: Secondary | ICD-10-CM | POA: Diagnosis not present

## 2022-03-06 DIAGNOSIS — G8929 Other chronic pain: Secondary | ICD-10-CM | POA: Diagnosis not present

## 2022-03-06 NOTE — Telephone Encounter (Signed)
FYI.  °Contacted patient regarding recall appointment, patient notified our office they did not wish to keep this appointment at this time.  Deleted recall from system. °

## 2022-04-06 DIAGNOSIS — M79604 Pain in right leg: Secondary | ICD-10-CM | POA: Diagnosis not present

## 2022-04-06 DIAGNOSIS — R051 Acute cough: Secondary | ICD-10-CM | POA: Diagnosis not present

## 2022-04-06 DIAGNOSIS — M79605 Pain in left leg: Secondary | ICD-10-CM | POA: Diagnosis not present

## 2022-04-06 DIAGNOSIS — M5136 Other intervertebral disc degeneration, lumbar region: Secondary | ICD-10-CM | POA: Diagnosis not present

## 2022-04-06 DIAGNOSIS — M5416 Radiculopathy, lumbar region: Secondary | ICD-10-CM | POA: Diagnosis not present

## 2022-04-06 DIAGNOSIS — J309 Allergic rhinitis, unspecified: Secondary | ICD-10-CM | POA: Diagnosis not present

## 2022-04-06 DIAGNOSIS — R030 Elevated blood-pressure reading, without diagnosis of hypertension: Secondary | ICD-10-CM | POA: Diagnosis not present

## 2022-04-06 DIAGNOSIS — N1832 Chronic kidney disease, stage 3b: Secondary | ICD-10-CM | POA: Diagnosis not present

## 2022-05-04 DIAGNOSIS — G8929 Other chronic pain: Secondary | ICD-10-CM | POA: Diagnosis not present

## 2022-05-04 DIAGNOSIS — Z79899 Other long term (current) drug therapy: Secondary | ICD-10-CM | POA: Diagnosis not present

## 2022-05-04 DIAGNOSIS — R030 Elevated blood-pressure reading, without diagnosis of hypertension: Secondary | ICD-10-CM | POA: Diagnosis not present

## 2022-05-04 DIAGNOSIS — M5136 Other intervertebral disc degeneration, lumbar region: Secondary | ICD-10-CM | POA: Diagnosis not present

## 2022-05-04 DIAGNOSIS — M5416 Radiculopathy, lumbar region: Secondary | ICD-10-CM | POA: Diagnosis not present

## 2022-06-05 DIAGNOSIS — M5136 Other intervertebral disc degeneration, lumbar region: Secondary | ICD-10-CM | POA: Diagnosis not present

## 2022-06-05 DIAGNOSIS — R030 Elevated blood-pressure reading, without diagnosis of hypertension: Secondary | ICD-10-CM | POA: Diagnosis not present

## 2022-06-05 DIAGNOSIS — G8929 Other chronic pain: Secondary | ICD-10-CM | POA: Diagnosis not present

## 2022-06-05 DIAGNOSIS — M5416 Radiculopathy, lumbar region: Secondary | ICD-10-CM | POA: Diagnosis not present

## 2022-07-05 DIAGNOSIS — M5416 Radiculopathy, lumbar region: Secondary | ICD-10-CM | POA: Diagnosis not present

## 2022-07-05 DIAGNOSIS — H6121 Impacted cerumen, right ear: Secondary | ICD-10-CM | POA: Diagnosis not present

## 2022-07-05 DIAGNOSIS — G8929 Other chronic pain: Secondary | ICD-10-CM | POA: Diagnosis not present

## 2022-07-05 DIAGNOSIS — R030 Elevated blood-pressure reading, without diagnosis of hypertension: Secondary | ICD-10-CM | POA: Diagnosis not present

## 2022-07-05 DIAGNOSIS — H9201 Otalgia, right ear: Secondary | ICD-10-CM | POA: Diagnosis not present

## 2022-07-05 DIAGNOSIS — M5136 Other intervertebral disc degeneration, lumbar region: Secondary | ICD-10-CM | POA: Diagnosis not present

## 2022-08-11 DIAGNOSIS — M5416 Radiculopathy, lumbar region: Secondary | ICD-10-CM | POA: Diagnosis not present

## 2022-08-11 DIAGNOSIS — G8929 Other chronic pain: Secondary | ICD-10-CM | POA: Diagnosis not present

## 2022-09-05 DIAGNOSIS — N1832 Chronic kidney disease, stage 3b: Secondary | ICD-10-CM | POA: Diagnosis not present

## 2022-09-05 DIAGNOSIS — G8929 Other chronic pain: Secondary | ICD-10-CM | POA: Diagnosis not present

## 2022-09-05 DIAGNOSIS — M5416 Radiculopathy, lumbar region: Secondary | ICD-10-CM | POA: Diagnosis not present

## 2022-10-09 DIAGNOSIS — E7800 Pure hypercholesterolemia, unspecified: Secondary | ICD-10-CM | POA: Diagnosis not present

## 2022-10-09 DIAGNOSIS — M25562 Pain in left knee: Secondary | ICD-10-CM | POA: Diagnosis not present

## 2022-10-09 DIAGNOSIS — Z0000 Encounter for general adult medical examination without abnormal findings: Secondary | ICD-10-CM | POA: Diagnosis not present

## 2022-10-09 DIAGNOSIS — M25561 Pain in right knee: Secondary | ICD-10-CM | POA: Diagnosis not present

## 2022-10-09 DIAGNOSIS — N1832 Chronic kidney disease, stage 3b: Secondary | ICD-10-CM | POA: Diagnosis not present

## 2022-10-09 DIAGNOSIS — M5416 Radiculopathy, lumbar region: Secondary | ICD-10-CM | POA: Diagnosis not present

## 2022-10-09 DIAGNOSIS — G8929 Other chronic pain: Secondary | ICD-10-CM | POA: Diagnosis not present

## 2022-11-08 DIAGNOSIS — N1832 Chronic kidney disease, stage 3b: Secondary | ICD-10-CM | POA: Diagnosis not present

## 2022-11-08 DIAGNOSIS — Z79899 Other long term (current) drug therapy: Secondary | ICD-10-CM | POA: Diagnosis not present

## 2022-11-08 DIAGNOSIS — G8929 Other chronic pain: Secondary | ICD-10-CM | POA: Diagnosis not present

## 2022-11-08 DIAGNOSIS — M5416 Radiculopathy, lumbar region: Secondary | ICD-10-CM | POA: Diagnosis not present

## 2022-11-08 DIAGNOSIS — Z780 Asymptomatic menopausal state: Secondary | ICD-10-CM | POA: Diagnosis not present

## 2022-12-15 DIAGNOSIS — G8929 Other chronic pain: Secondary | ICD-10-CM | POA: Diagnosis not present

## 2022-12-15 DIAGNOSIS — M5416 Radiculopathy, lumbar region: Secondary | ICD-10-CM | POA: Diagnosis not present

## 2023-01-15 DIAGNOSIS — N1832 Chronic kidney disease, stage 3b: Secondary | ICD-10-CM | POA: Diagnosis not present

## 2023-01-15 DIAGNOSIS — G8929 Other chronic pain: Secondary | ICD-10-CM | POA: Diagnosis not present

## 2023-01-15 DIAGNOSIS — M5416 Radiculopathy, lumbar region: Secondary | ICD-10-CM | POA: Diagnosis not present

## 2023-02-12 DIAGNOSIS — G8929 Other chronic pain: Secondary | ICD-10-CM | POA: Diagnosis not present

## 2023-02-12 DIAGNOSIS — M5416 Radiculopathy, lumbar region: Secondary | ICD-10-CM | POA: Diagnosis not present

## 2023-03-15 DIAGNOSIS — E7800 Pure hypercholesterolemia, unspecified: Secondary | ICD-10-CM | POA: Diagnosis not present

## 2023-03-15 DIAGNOSIS — M5416 Radiculopathy, lumbar region: Secondary | ICD-10-CM | POA: Diagnosis not present

## 2023-03-15 DIAGNOSIS — N1832 Chronic kidney disease, stage 3b: Secondary | ICD-10-CM | POA: Diagnosis not present

## 2023-03-15 DIAGNOSIS — G8929 Other chronic pain: Secondary | ICD-10-CM | POA: Diagnosis not present

## 2023-03-15 DIAGNOSIS — R42 Dizziness and giddiness: Secondary | ICD-10-CM | POA: Diagnosis not present

## 2023-04-10 DIAGNOSIS — R42 Dizziness and giddiness: Secondary | ICD-10-CM | POA: Diagnosis not present

## 2023-04-14 DIAGNOSIS — M5416 Radiculopathy, lumbar region: Secondary | ICD-10-CM | POA: Diagnosis not present

## 2023-04-14 DIAGNOSIS — G8929 Other chronic pain: Secondary | ICD-10-CM | POA: Diagnosis not present

## 2023-05-14 DIAGNOSIS — M5416 Radiculopathy, lumbar region: Secondary | ICD-10-CM | POA: Diagnosis not present

## 2023-05-14 DIAGNOSIS — G8929 Other chronic pain: Secondary | ICD-10-CM | POA: Diagnosis not present

## 2023-05-14 DIAGNOSIS — K219 Gastro-esophageal reflux disease without esophagitis: Secondary | ICD-10-CM | POA: Diagnosis not present

## 2023-05-14 DIAGNOSIS — Z79899 Other long term (current) drug therapy: Secondary | ICD-10-CM | POA: Diagnosis not present

## 2023-05-14 DIAGNOSIS — N1832 Chronic kidney disease, stage 3b: Secondary | ICD-10-CM | POA: Diagnosis not present

## 2023-06-19 DIAGNOSIS — Z780 Asymptomatic menopausal state: Secondary | ICD-10-CM | POA: Diagnosis not present

## 2023-06-19 DIAGNOSIS — G8929 Other chronic pain: Secondary | ICD-10-CM | POA: Diagnosis not present

## 2023-06-19 DIAGNOSIS — M5416 Radiculopathy, lumbar region: Secondary | ICD-10-CM | POA: Diagnosis not present

## 2023-07-20 DIAGNOSIS — Z780 Asymptomatic menopausal state: Secondary | ICD-10-CM | POA: Diagnosis not present

## 2023-07-20 DIAGNOSIS — M5416 Radiculopathy, lumbar region: Secondary | ICD-10-CM | POA: Diagnosis not present

## 2023-07-20 DIAGNOSIS — E559 Vitamin D deficiency, unspecified: Secondary | ICD-10-CM | POA: Diagnosis not present

## 2023-07-20 DIAGNOSIS — R5383 Other fatigue: Secondary | ICD-10-CM | POA: Diagnosis not present

## 2023-07-20 DIAGNOSIS — M129 Arthropathy, unspecified: Secondary | ICD-10-CM | POA: Diagnosis not present

## 2023-07-20 DIAGNOSIS — Z79899 Other long term (current) drug therapy: Secondary | ICD-10-CM | POA: Diagnosis not present

## 2023-07-20 DIAGNOSIS — Z131 Encounter for screening for diabetes mellitus: Secondary | ICD-10-CM | POA: Diagnosis not present

## 2023-07-20 DIAGNOSIS — E7800 Pure hypercholesterolemia, unspecified: Secondary | ICD-10-CM | POA: Diagnosis not present

## 2023-07-20 DIAGNOSIS — R0602 Shortness of breath: Secondary | ICD-10-CM | POA: Diagnosis not present

## 2023-07-20 DIAGNOSIS — Z1159 Encounter for screening for other viral diseases: Secondary | ICD-10-CM | POA: Diagnosis not present

## 2023-07-20 DIAGNOSIS — Z0000 Encounter for general adult medical examination without abnormal findings: Secondary | ICD-10-CM | POA: Diagnosis not present

## 2023-07-20 DIAGNOSIS — G8929 Other chronic pain: Secondary | ICD-10-CM | POA: Diagnosis not present

## 2023-08-14 DIAGNOSIS — M5416 Radiculopathy, lumbar region: Secondary | ICD-10-CM | POA: Diagnosis not present

## 2023-08-14 DIAGNOSIS — G8929 Other chronic pain: Secondary | ICD-10-CM | POA: Diagnosis not present

## 2023-09-23 DIAGNOSIS — M5416 Radiculopathy, lumbar region: Secondary | ICD-10-CM | POA: Diagnosis not present

## 2023-09-23 DIAGNOSIS — G8929 Other chronic pain: Secondary | ICD-10-CM | POA: Diagnosis not present

## 2023-09-23 DIAGNOSIS — R42 Dizziness and giddiness: Secondary | ICD-10-CM | POA: Diagnosis not present

## 2023-09-23 DIAGNOSIS — N1832 Chronic kidney disease, stage 3b: Secondary | ICD-10-CM | POA: Diagnosis not present

## 2023-10-24 DIAGNOSIS — I10 Essential (primary) hypertension: Secondary | ICD-10-CM | POA: Diagnosis not present

## 2023-10-24 DIAGNOSIS — G8929 Other chronic pain: Secondary | ICD-10-CM | POA: Diagnosis not present

## 2023-10-24 DIAGNOSIS — Z79899 Other long term (current) drug therapy: Secondary | ICD-10-CM | POA: Diagnosis not present

## 2023-12-20 ENCOUNTER — Other Ambulatory Visit (HOSPITAL_COMMUNITY): Payer: Self-pay | Admitting: Nephrology

## 2023-12-20 DIAGNOSIS — N184 Chronic kidney disease, stage 4 (severe): Secondary | ICD-10-CM

## 2024-01-02 ENCOUNTER — Telehealth: Payer: Self-pay

## 2024-01-02 NOTE — Telephone Encounter (Signed)
 Patient is overdue for an appointment. Due to VM being full unable to [facility] for patient to call and schedule.

## 2024-01-08 ENCOUNTER — Telehealth: Payer: Self-pay

## 2024-01-08 NOTE — Telephone Encounter (Signed)
 Patient has not been seen at all at [facility]. They are on the list from [employer] that they need an appt and [id number] is listed as their PCP. I have called them and left them a message. If they call back please get them scheduled to see a PCP. If they have PCP elsewhere please tell them to let [facility] know to remove us  from their PCP.
